# Patient Record
Sex: Female | Born: 1960 | Race: White | Hispanic: No | Smoking: Former smoker
Health system: Southern US, Community
[De-identification: ages and names within clinical notes are randomized; demographics above are authoritative.]

## PROBLEM LIST (undated history)

## (undated) DIAGNOSIS — I10 Essential (primary) hypertension: Secondary | ICD-10-CM

## (undated) DIAGNOSIS — C801 Malignant (primary) neoplasm, unspecified: Secondary | ICD-10-CM

## (undated) DIAGNOSIS — E119 Type 2 diabetes mellitus without complications: Secondary | ICD-10-CM

## (undated) DIAGNOSIS — M419 Scoliosis, unspecified: Secondary | ICD-10-CM

## (undated) DIAGNOSIS — G4730 Sleep apnea, unspecified: Secondary | ICD-10-CM

## (undated) DIAGNOSIS — F419 Anxiety disorder, unspecified: Secondary | ICD-10-CM

## (undated) DIAGNOSIS — F329 Major depressive disorder, single episode, unspecified: Secondary | ICD-10-CM

## (undated) DIAGNOSIS — E785 Hyperlipidemia, unspecified: Secondary | ICD-10-CM

## (undated) DIAGNOSIS — F32A Depression, unspecified: Secondary | ICD-10-CM

## (undated) HISTORY — DX: Malignant (primary) neoplasm, unspecified: C80.1

## (undated) HISTORY — PX: TONSILLECTOMY: SUR1361

## (undated) HISTORY — DX: Type 2 diabetes mellitus without complications: E11.9

## (undated) HISTORY — PX: APPENDECTOMY: SHX54

## (undated) HISTORY — PX: REPLACEMENT TOTAL KNEE: SUR1224

## (undated) HISTORY — DX: Sleep apnea, unspecified: G47.30

## (undated) HISTORY — DX: Scoliosis, unspecified: M41.9

## (undated) HISTORY — DX: Depression, unspecified: F32.A

## (undated) HISTORY — DX: Essential (primary) hypertension: I10

## (undated) HISTORY — DX: Hyperlipidemia, unspecified: E78.5

## (undated) HISTORY — DX: Anxiety disorder, unspecified: F41.9

## (undated) HISTORY — DX: Major depressive disorder, single episode, unspecified: F32.9

## (undated) HISTORY — PX: ABDOMINAL HYSTERECTOMY: SHX81

---

## 2000-10-02 ENCOUNTER — Other Ambulatory Visit: Admission: RE | Admit: 2000-10-02 | Discharge: 2000-10-02 | Payer: Self-pay | Admitting: Otolaryngology

## 2004-06-09 ENCOUNTER — Ambulatory Visit: Payer: Self-pay | Admitting: Internal Medicine

## 2005-02-07 ENCOUNTER — Ambulatory Visit: Payer: Self-pay | Admitting: Internal Medicine

## 2005-02-26 ENCOUNTER — Emergency Department: Payer: Self-pay | Admitting: Unknown Physician Specialty

## 2005-03-12 ENCOUNTER — Emergency Department: Payer: Self-pay | Admitting: Internal Medicine

## 2006-07-25 ENCOUNTER — Ambulatory Visit: Payer: Self-pay | Admitting: Unknown Physician Specialty

## 2006-08-23 ENCOUNTER — Ambulatory Visit: Payer: Self-pay | Admitting: Urology

## 2006-11-06 ENCOUNTER — Emergency Department: Payer: Self-pay | Admitting: Unknown Physician Specialty

## 2006-11-06 ENCOUNTER — Other Ambulatory Visit: Payer: Self-pay

## 2008-05-20 ENCOUNTER — Ambulatory Visit: Payer: Self-pay | Admitting: Unknown Physician Specialty

## 2008-05-21 ENCOUNTER — Inpatient Hospital Stay: Payer: Self-pay | Admitting: Unknown Physician Specialty

## 2009-05-09 ENCOUNTER — Ambulatory Visit: Payer: Self-pay

## 2009-11-18 ENCOUNTER — Ambulatory Visit: Payer: Self-pay | Admitting: Otolaryngology

## 2009-11-22 ENCOUNTER — Ambulatory Visit: Payer: Self-pay | Admitting: Otolaryngology

## 2009-12-16 ENCOUNTER — Ambulatory Visit: Payer: Self-pay | Admitting: Otolaryngology

## 2010-01-13 ENCOUNTER — Ambulatory Visit: Payer: Self-pay | Admitting: Otolaryngology

## 2010-12-27 ENCOUNTER — Ambulatory Visit: Payer: Self-pay | Admitting: Internal Medicine

## 2011-11-30 ENCOUNTER — Telehealth: Payer: Self-pay | Admitting: *Deleted

## 2014-04-16 ENCOUNTER — Ambulatory Visit
Admit: 2014-04-16 | Disposition: A | Discharge status: Home / Self Care | Payer: Self-pay | Attending: Obstetrics and Gynecology | Admitting: Obstetrics and Gynecology

## 2014-05-07 ENCOUNTER — Ambulatory Visit
Admit: 2014-05-07 | Disposition: A | Discharge status: Home / Self Care | Payer: Self-pay | Attending: Obstetrics and Gynecology | Admitting: Obstetrics and Gynecology

## 2014-07-21 ENCOUNTER — Telehealth: Payer: Self-pay | Admitting: Urology

## 2014-07-21 NOTE — Telephone Encounter (Signed)
We received a fax correspondence from Severiano Gilbert with the [facility].  She states that "the patient was scheduled for 05/07/14, but called and cancelled the appointment."

## 2014-07-23 ENCOUNTER — Ambulatory Visit: Payer: Self-pay | Admitting: Urology

## 2015-05-03 DIAGNOSIS — Z87891 Personal history of nicotine dependence: Secondary | ICD-10-CM | POA: Insufficient documentation

## 2015-05-03 DIAGNOSIS — Z8639 Personal history of other endocrine, nutritional and metabolic disease: Secondary | ICD-10-CM | POA: Insufficient documentation

## 2015-05-03 DIAGNOSIS — I10 Essential (primary) hypertension: Secondary | ICD-10-CM | POA: Insufficient documentation

## 2015-05-03 DIAGNOSIS — F329 Major depressive disorder, single episode, unspecified: Secondary | ICD-10-CM | POA: Insufficient documentation

## 2015-05-03 DIAGNOSIS — J449 Chronic obstructive pulmonary disease, unspecified: Secondary | ICD-10-CM | POA: Insufficient documentation

## 2015-05-03 DIAGNOSIS — F32A Depression, unspecified: Secondary | ICD-10-CM | POA: Insufficient documentation

## 2015-05-17 DIAGNOSIS — G4733 Obstructive sleep apnea (adult) (pediatric): Secondary | ICD-10-CM | POA: Insufficient documentation

## 2015-05-17 DIAGNOSIS — E559 Vitamin D deficiency, unspecified: Secondary | ICD-10-CM | POA: Insufficient documentation

## 2015-07-07 ENCOUNTER — Encounter: Payer: BLUE CROSS/BLUE SHIELD | Attending: Internal Medicine | Admitting: *Deleted

## 2015-07-07 ENCOUNTER — Encounter: Payer: Self-pay | Admitting: *Deleted

## 2015-07-07 VITALS — BP 124/82 | Ht 69.0 in | Wt 376.4 lb

## 2015-07-07 DIAGNOSIS — Z794 Long term (current) use of insulin: Secondary | ICD-10-CM

## 2015-07-07 DIAGNOSIS — E119 Type 2 diabetes mellitus without complications: Secondary | ICD-10-CM | POA: Insufficient documentation

## 2015-07-07 NOTE — Patient Instructions (Addendum)
Check blood sugars 2 x day before breakfast and before supper every day Exercise: Begin walking for  5 minutes 3-4 days a week and gradually increase to 30 minutes 5 x week Eat 3 meals day,  1-2  snacks a day Space meals 4-6 hours apart Limit desserts/snacks Make an eye doctor appointment Bring blood sugar records to the next class Carry fast acting glucose and a snack at all times Roll insulin pen to mix Rotate injection sites Hold pen in place for 5-10 seconds after injection

## 2015-07-08 NOTE — Progress Notes (Signed)
Diabetes Self-Management Education  Visit Type: First/Initial  Appt. Start Time: 1325 Appt. End Time: 1440  07/07/2015  Ms. Lorraine Hutchinson, identified by name and date of birth, is a 55 y.o. female with a diagnosis of Diabetes: Type 2.   ASSESSMENT  Blood pressure 124/82, height 5\' 9"  (1.753 m), weight 376 lb 6.4 oz (170.734 kg). Body mass index is 55.56 kg/(m^2).      Diabetes Self-Management Education - 07/07/15 1510    Visit Information   Visit Type First/Initial   Initial Visit   Diabetes Type Type 2   Are you currently following a meal plan? No   Are you taking your medications as prescribed? Yes   Date Diagnosed 11 years ago   Health Coping   How would you rate your overall health? Poor   Psychosocial Assessment   Patient Belief/Attitude about Diabetes Defeat/Burnout   Self-care barriers None   Self-management support Doctor's office   Patient Concerns Nutrition/Meal planning;Weight Control;Healthy Lifestyle;Glycemic Control   Special Needs None   Preferred Learning Style Hands on   Learning Readiness Contemplating   How often do you need to have someone help you when you read instructions, pamphlets, or other written materials from your doctor or pharmacy? 1 - Never   What is the last grade level you completed in school? 11   Pre-Education Assessment   Patient understands the diabetes disease and treatment process. Needs Instruction   Patient understands incorporating nutritional management into lifestyle. Needs Instruction   Patient undertands incorporating physical activity into lifestyle. Needs Instruction   Patient understands using medications safely. Needs Instruction   Patient understands monitoring blood glucose, interpreting and using results Needs Instruction   Patient understands prevention, detection, and treatment of acute complications. Needs Instruction   Patient understands prevention, detection, and treatment of chronic complications. Needs  Instruction   Patient understands how to develop strategies to address psychosocial issues. Needs Instruction   Patient understands how to develop strategies to promote health/change behavior. Needs Instruction   Complications   Last HgB A1C per patient/outside source 9.8 %  05/17/15   How often do you check your blood sugar? 1-2 times/day   Fasting Blood glucose range (mg/dL) 70-129;130-179  pt reports FBG's 120-160's mg/dL   Postprandial Blood glucose range (mg/dL) --  pt reports pre-supper readings 120-160's mg/dL   Have you had a dilated eye exam in the past 12 months? No   Have you had a dental exam in the past 12 months? No   Are you checking your feet? Yes   How many days per week are you checking your feet? 2   Dietary Intake   Breakfast raisin bran and milk; just bought oatmeal    Snack (morning) 2 snacks per day that consist of honeybuns and cakes   Lunch peanut butter and banana sandwich   Dinner meat and 2 vegetables;green beans, peas, cuccumbers, tomatoes, occasional potatoes   Beverage(s) water   Exercise   Exercise Type ADL's   Patient Education   Previous Diabetes Education No   Disease state  Definition of diabetes, type 1 and 2, and the diagnosis of diabetes   Nutrition management  Role of diet in the treatment of diabetes and the relationship between the three main macronutrients and blood glucose level   Physical activity and exercise  Role of exercise on diabetes management, blood pressure control and cardiac health.   Medications Taught/reviewed insulin injection, site rotation, insulin storage and needle disposal.;Reviewed patients medication for diabetes, action,  purpose, timing of dose and side effects.   Monitoring Purpose and frequency of SMBG.;Identified appropriate SMBG and/or A1C goals.   Acute complications Taught treatment of hypoglycemia - the 15 rule.   Chronic complications Relationship between chronic complications and blood glucose control    Psychosocial adjustment Identified and addressed patients feelings and concerns about diabetes   Individualized Goals (developed by patient)   Reducing Risk Improve blood sugars Lead a healthier lifestyle Lose weight   Outcomes   Expected Outcomes Demonstrated limited interest in learning.  Expect minimal changes      Individualized Plan for Diabetes Self-Management Training:   Learning Objective:  Patient will have a greater understanding of diabetes self-management. Patient education plan is to attend individual and/or group sessions per assessed needs and concerns.   Plan:   Patient Instructions  Check blood sugars 2 x day before breakfast and before supper every day Exercise: Begin walking for  5 minutes 3-4 days a week and gradually increase to 30 minutes 5 x week Eat 3 meals day,  1-2  snacks a day Space meals 4-6 hours apart Limit desserts/snacks Make an eye doctor appointment Bring blood sugar records to the next class Carry fast acting glucose and a snack at all times Roll insulin pen to mix Rotate injection sites Hold pen in place for 5-10 seconds after injection     Expected Outcomes:  Demonstrated limited interest in learning.  Expect minimal changes  Education material provided:  General Meal Planning Guidelines Simple Meal Plan Glucose tablets Symptoms, causes and treatments of Hypoglycemia Site Selection Using Insulin Pens and Pen Needles  If problems or questions, patient to contact team via:  Johny Drilling, [place], [place], [place] [phone]  Future DSME appointment:  July 11, 2015 for Diabetes Class 1

## 2015-07-11 ENCOUNTER — Encounter: Payer: BLUE CROSS/BLUE SHIELD | Admitting: Dietician

## 2015-07-11 ENCOUNTER — Encounter: Payer: Self-pay | Admitting: Dietician

## 2015-07-11 VITALS — Ht 69.0 in | Wt 377.3 lb

## 2015-07-11 DIAGNOSIS — E119 Type 2 diabetes mellitus without complications: Secondary | ICD-10-CM

## 2015-07-11 DIAGNOSIS — Z794 Long term (current) use of insulin: Principal | ICD-10-CM

## 2015-07-11 NOTE — Progress Notes (Signed)

## 2015-07-25 ENCOUNTER — Telehealth: Payer: Self-pay | Admitting: *Deleted

## 2015-07-25 ENCOUNTER — Ambulatory Visit: Payer: BLUE CROSS/BLUE SHIELD

## 2015-07-25 NOTE — Telephone Encounter (Signed)
Patient left voice mail that she couldn't make class tonight. She injured her back and can't sit for long periods.

## 2015-08-01 ENCOUNTER — Ambulatory Visit: Payer: BLUE CROSS/BLUE SHIELD

## 2015-08-03 ENCOUNTER — Telehealth: Payer: Self-pay | Admitting: Dietician

## 2015-08-03 NOTE — Telephone Encounter (Signed)
Called patient to reschedule missed classes; left voicemail message for her to call back.

## 2015-08-25 ENCOUNTER — Encounter: Payer: Self-pay | Admitting: *Deleted

## 2016-06-07 ENCOUNTER — Other Ambulatory Visit: Payer: Self-pay | Admitting: Student

## 2016-06-07 DIAGNOSIS — Z1231 Encounter for screening mammogram for malignant neoplasm of breast: Secondary | ICD-10-CM

## 2016-08-24 ENCOUNTER — Other Ambulatory Visit: Payer: Self-pay | Admitting: Orthopedic Surgery

## 2016-08-24 DIAGNOSIS — Z96651 Presence of right artificial knee joint: Principal | ICD-10-CM

## 2016-08-24 DIAGNOSIS — T8484XA Pain due to internal orthopedic prosthetic devices, implants and grafts, initial encounter: Secondary | ICD-10-CM

## 2016-08-31 ENCOUNTER — Ambulatory Visit
Admission: RE | Admit: 2016-08-31 | Discharge: 2016-08-31 | Disposition: A | Discharge status: Home / Self Care | Payer: BLUE CROSS/BLUE SHIELD | Source: Ambulatory Visit | Attending: Orthopedic Surgery | Admitting: Orthopedic Surgery

## 2016-08-31 DIAGNOSIS — Z9889 Other specified postprocedural states: Secondary | ICD-10-CM | POA: Diagnosis not present

## 2016-08-31 DIAGNOSIS — T8484XA Pain due to internal orthopedic prosthetic devices, implants and grafts, initial encounter: Secondary | ICD-10-CM

## 2016-08-31 DIAGNOSIS — Z96651 Presence of right artificial knee joint: Secondary | ICD-10-CM | POA: Insufficient documentation

## 2016-09-18 ENCOUNTER — Encounter: Payer: Self-pay | Admitting: Nurse Practitioner

## 2016-09-18 ENCOUNTER — Ambulatory Visit: Payer: BLUE CROSS/BLUE SHIELD | Attending: Nurse Practitioner | Admitting: Nurse Practitioner

## 2016-09-18 DIAGNOSIS — M79604 Pain in right leg: Secondary | ICD-10-CM

## 2016-09-18 DIAGNOSIS — J449 Chronic obstructive pulmonary disease, unspecified: Secondary | ICD-10-CM | POA: Diagnosis not present

## 2016-09-18 DIAGNOSIS — G4733 Obstructive sleep apnea (adult) (pediatric): Secondary | ICD-10-CM | POA: Insufficient documentation

## 2016-09-18 DIAGNOSIS — I10 Essential (primary) hypertension: Secondary | ICD-10-CM | POA: Insufficient documentation

## 2016-09-18 DIAGNOSIS — Z79891 Long term (current) use of opiate analgesic: Secondary | ICD-10-CM | POA: Diagnosis not present

## 2016-09-18 DIAGNOSIS — M25561 Pain in right knee: Secondary | ICD-10-CM | POA: Diagnosis not present

## 2016-09-18 DIAGNOSIS — Z885 Allergy status to narcotic agent status: Secondary | ICD-10-CM | POA: Insufficient documentation

## 2016-09-18 DIAGNOSIS — F329 Major depressive disorder, single episode, unspecified: Secondary | ICD-10-CM | POA: Diagnosis not present

## 2016-09-18 DIAGNOSIS — M542 Cervicalgia: Secondary | ICD-10-CM | POA: Diagnosis not present

## 2016-09-18 DIAGNOSIS — M79605 Pain in left leg: Secondary | ICD-10-CM

## 2016-09-18 DIAGNOSIS — M545 Low back pain: Secondary | ICD-10-CM | POA: Diagnosis not present

## 2016-09-18 DIAGNOSIS — Z6841 Body Mass Index (BMI) 40.0 and over, adult: Secondary | ICD-10-CM | POA: Insufficient documentation

## 2016-09-18 DIAGNOSIS — G8929 Other chronic pain: Secondary | ICD-10-CM

## 2016-09-18 DIAGNOSIS — Z79899 Other long term (current) drug therapy: Secondary | ICD-10-CM | POA: Insufficient documentation

## 2016-09-18 DIAGNOSIS — M25511 Pain in right shoulder: Secondary | ICD-10-CM

## 2016-09-18 DIAGNOSIS — Z87891 Personal history of nicotine dependence: Secondary | ICD-10-CM | POA: Diagnosis not present

## 2016-09-18 DIAGNOSIS — E785 Hyperlipidemia, unspecified: Secondary | ICD-10-CM | POA: Insufficient documentation

## 2016-09-18 DIAGNOSIS — E119 Type 2 diabetes mellitus without complications: Secondary | ICD-10-CM | POA: Diagnosis not present

## 2016-09-18 DIAGNOSIS — F1190 Opioid use, unspecified, uncomplicated: Secondary | ICD-10-CM | POA: Insufficient documentation

## 2016-09-18 DIAGNOSIS — M25512 Pain in left shoulder: Secondary | ICD-10-CM | POA: Diagnosis not present

## 2016-09-18 DIAGNOSIS — M533 Sacrococcygeal disorders, not elsewhere classified: Secondary | ICD-10-CM | POA: Diagnosis not present

## 2016-09-18 DIAGNOSIS — Z7984 Long term (current) use of oral hypoglycemic drugs: Secondary | ICD-10-CM | POA: Diagnosis not present

## 2016-09-18 DIAGNOSIS — E559 Vitamin D deficiency, unspecified: Secondary | ICD-10-CM | POA: Diagnosis not present

## 2016-09-18 DIAGNOSIS — Z1321 Encounter for screening for nutritional disorder: Secondary | ICD-10-CM | POA: Diagnosis not present

## 2016-09-18 NOTE — Progress Notes (Signed)
Patient's Name: Lorraine Hutchinson  MRN: [id number]  Referring Provider: Hessie Knows, MD  DOB: 1960-02-07  PCP: Wayland Denis, PA-C  DOS: 09/18/2016  Note by: Dionisio David NP  Service setting: Ambulatory outpatient  Specialty: Interventional Pain Management  Location: [facility] (AMB) Pain Management Facility    Patient type: New Patient    Primary Reason(s) for Visit: Initial Patient Evaluation CC: Back Pain (entire) and Neck Pain (shoulders)  HPI  Ms. Lorraine Hutchinson is a 56 y.o. year old, female patient, who comes today for an initial evaluation. She has Chronic depression; Chronic midline low back pain without sciatica; COPD, moderate ([place]); Essential hypertension; Former cigarette smoker; History of type 2 diabetes mellitus; Obesity, morbid, BMI 50 or higher ([place]); OSA (obstructive sleep apnea); Vitamin D deficiency, unspecified; on her problem list.. Her primarily concern today is the Back Pain (entire) and Neck Pain (shoulders)  Pain Assessment: Location: Upper, Mid, Lower Back Radiating: hips/buttocks down back of leg to the calve-(right) Onset: More than a month ago Duration: Chronic pain Quality: Shooting, Burning, Aching, Discomfort, Nagging Severity: 8 /10 (self-reported pain score)  Note: Reported level is compatible with observation.                   Effect on ADL: "light duty" sits a lot Timing: Constant Modifying factors: rest   Onset and Duration: Present longer than 3 months Cause of pain: Unknown Severity: Getting worse, NAS-11 at its worse: 10/10, NAS-11 at its best: 8/10, NAS-11 now: 8/10 and NAS-11 on the average: 8/10 Timing: After activity or exercise Aggravating Factors: Bending, Kneeling, Lifiting, Prolonged sitting, Prolonged standing, Stooping , Twisting, Walking, Walking uphill, Walking downhill and Working Alleviating Factors: Lying down, Resting and Sitting Associated Problems: Depression, Inability to control bladder (urine), Sadness, Sweating, Swelling, Pain that  wakes patient up and Pain that does not allow patient to sleep Quality of Pain: Aching, Burning, Dull, Nagging, Sharp, Shooting, Stabbing, Tingling and Uncomfortable Previous Examinations or Tests: MRI scan, X-rays and Orthopedic evaluation Previous Treatments: Pool exercises  The patient comes into the clinics today for the first time for a chronic pain management evaluation. According to the patient her primary area of pain is in her lower back she denies any precipitating factors. She feels this is related to her diagnosis of scoliosis. She did suffer a fall and 2017 which was worked related. She states that she tripped failed She admits that this has been resolved.she has been following Dr. Rudene Christians. She feels like the back pain is midline; the right could be greater than the left. She denies any previous surgeries.She has had epidural steroid by Dr. Sharlet Salina. She does not feel that this was effective. She did have physical therapy in 2017 which she states was not effective. She did go to 3 times per week.  Her second area of pain is in her lower extremities. She feels that the right is greater than the left. She does have radicular symptoms that goes down into her calf. She has tingling and weakness. She denies any recent physical therapy.  Her third area of pain is in her neck. She states that she does have some radicular symptoms that goes into her shoulders.She denies any previous surgery, interventional therapy, physical therapy or recent images for her neck.  Her fourth area of pain is in her shoulders. The right is greater than the left. She does have occasional numbness and tingling down into her fingertips. She denies any previous surgery, interventional therapy, physical therapy. She did  have recent images of her right shoulder.  Her last area of pain is in her right knee. She is status post total knee replacement approximately 10 year ago.she did have interventional procedures prior to your  surgery which were effective. She denies any recent physical therapy but she did have some recent images.  Today I took the time to provide the patient with information regarding this pain practice. The patient was informed that the practice is divided into two sections: an interventional pain management section, as well as a completely separate and distinct medication management section. I explained that there are procedure days for interventional therapies, and evaluation days for follow-ups and medication management. Because of the amount of documentation required during both, they are kept separated. This means that there is the possibility that she may be scheduled for a procedure on one day, and medication management the next. I have also informed herthat because of staffing and facility limitations, this practice will no longer take patients for medication management only. To illustrate the reasons for this, I gave the patient the example of surgeons, and how inappropriate it would be to refer a patient to her care, just to write for the post-surgical antibiotics on a surgery done by a different surgeon.   Because interventional pain management is part of the board-certified specialty for the doctors, the patient was informed that joining this practice means that they are open to any and all interventional therapies. I made it clear that this does not mean that they will be forced to have any procedures done. What this means is that I believe interventional therapies to be essential part of the diagnosis and proper management of chronic pain conditions. Therefore, patients not interested in these interventional alternatives will be better served under the care of a different practitioner.  The patient was also made aware of my Comprehensive Pain Management Safety Guidelines where by joining this practice, they limit all of their nerve blocks and joint injections to those done by our practice, for as  long as we are retained to manage their care. Historic Controlled Substance Pharmacotherapy Review  PMP and historical list of controlled substances: hydrocodone/acetaminophe.5/325, hydrocodone/acetaminophen 10/325 mg, Lyrica 75 mg, hydrocodone/acetaminophen 5/325 mg, hydrocodone/acetaminophen 7.5/500 mg, Highest opioid analgesic regimen found:hydrocodone/acetaminophen 10/325 mg 3 times daily (fill date 10/12/2014) (hydrocodone 30 mg per day) Most recent opioid analgesic: none Current opioid analgesics: none Highest recorded MME/day: 30 mg/day MME/day: 0 mg/day Medications: The patient did not bring the medication(s) to the appointment, as requested in our "New Patient Package" Pharmacodynamics: Desired effects: Analgesia: The patient reports >50% benefit. Reported improvement in function: The patient reports medication allows her to accomplish basic ADLs. Clinically meaningful improvement in function (CMIF): Sustained CMIF goals met Perceived effectiveness: Described as relatively effective, allowing for increase in activities of daily living (ADL) Undesirable effects: Side-effects or Adverse reactions: None reported Historical Monitoring: The patient  has no drug history on file. List of all UDS Test(s): No results found for: MDMA, COCAINSCRNUR, PCPSCRNUR, PCPQUANT, CANNABQUANT, THCU, [place] List of all Serum Drug Screening Test(s):  No results found for: AMPHSCRSER, BARBSCRSER, BENZOSCRSER, COCAINSCRSER, PCPSCRSER, PCPQUANT, THCSCRSER, CANNABQUANT, OPIATESCRSER, OXYSCRSER, PROPOXSCRSER Historical Background Evaluation: [place] PDMP: Six (6) year initial data search conducted.             [place] Department of public safety, offender search: Editor, commissioning Information) Non-contributory Risk Assessment Profile: Aberrant behavior: None observed or detected today Risk factors for fatal opioid overdose: None identified today Fatal overdose hazard ratio (  HR): Calculation deferred Non-fatal overdose hazard  ratio (HR): Calculation deferred Risk of opioid abuse or dependence: 0.7-3.0% with doses ? 36 MME/day and 6.1-26% with doses ? 120 MME/day. Substance use disorder (SUD) risk level: Pending results of Medical Psychology Evaluation for SUD Opioid risk tool (ORT) (Total Score): 3  ORT Scoring interpretation table:  Score <3 = Low Risk for SUD  Score between 4-7 = Moderate Risk for SUD  Score >8 = High Risk for Opioid Abuse   PHQ-2 Depression Scale:  Total score:    PHQ-2 Scoring interpretation table: (Score and probability of major depressive disorder)  Score 0 = No depression  Score 1 = 15.4% Probability  Score 2 = 21.1% Probability  Score 3 = 38.4% Probability  Score 4 = 45.5% Probability  Score 5 = 56.4% Probability  Score 6 = 78.6% Probability   PHQ-9 Depression Scale:  Total score:    PHQ-9 Scoring interpretation table:  Score 0-4 = No depression  Score 5-9 = Mild depression  Score 10-14 = Moderate depression  Score 15-19 = Moderately severe depression  Score 20-27 = Severe depression (2.4 times higher risk of SUD and 2.89 times higher risk of overuse)   Pharmacologic Plan: Pending ordered tests and/or consults  Meds  The patient has a current medication list which includes the following prescription(s): alpha-lipoic acid, th vitamin b 50/b-complex, calcium-vitamin d, vitamin d3, chromium picolinate, cinnamon, dextromethorphan-guaifenesin, diclofenac, diltiazem, duloxetine, insulin aspart protamine - aspart, lisinopril-hydrochlorothiazide, lutein, melatonin, multivitamin, multiple vitamins-minerals, omeprazole, potassium, pravastatin, trazodone, and metformin.  Current Outpatient Prescriptions on File Prior to Visit  Medication Sig  . B Complex-Biotin-FA (TH VITAMIN B 50/B-COMPLEX) TABS Take 1 tablet by mouth daily.  . Calcium-Vitamin D 600-200 MG-UNIT tablet Take 1 tablet by mouth 2 (two) times daily.  Marland Kitchen Dextromethorphan-Guaifenesin 60-1200 MG 12hr tablet Take 1 tablet by  mouth 2 (two) times daily.  . diclofenac (VOLTAREN) 75 MG EC tablet Take 75 mg by mouth 2 (two) times daily.  Marland Kitchen diltiazem (DILACOR XR) 180 MG 24 hr capsule Take 180 mg by mouth 2 (two) times daily.  . insulin aspart protamine - aspart (NOVOLOG MIX 70/30 FLEXPEN) (70-30) 100 UNIT/ML FlexPen Inject 60 Units into the skin daily before breakfast. 50 units before supper  . lisinopril-hydrochlorothiazide (PRINZIDE,ZESTORETIC) 20-25 MG tablet Take 1 tablet by mouth daily.  . Multiple Vitamin (MULTIVITAMIN) tablet Take 1 tablet by mouth daily.  Marland Kitchen omeprazole (PRILOSEC) 20 MG capsule Take 20 mg by mouth daily.  . Potassium 99 MG TABS Take 1 tablet by mouth daily.  . pravastatin (PRAVACHOL) 40 MG tablet Take 40 mg by mouth daily.  . traZODone (DESYREL) 50 MG tablet Take 50 mg by mouth daily.  . metFORMIN (GLUCOPHAGE) 1000 MG tablet Take 1,000 mg by mouth 2 (two) times daily.   No current facility-administered medications on file prior to visit.    Imaging Review  Lumbosacral Imaging:  Lumbar MR wo contrast:  Results for orders placed in visit on 07/25/06  MR L Spine Ltd W/O Cm   Narrative * PRIOR REPORT IMPORTED FROM AN EXTERNAL SYSTEM *   PRIOR REPORT IMPORTED FROM THE [facility] [place] EXAM:    low back pain  COMMENTS:   PROCEDURE:     MR  - MR LUMBAR SPINE WO CONTRAST  - Jul 25 2006  9:39AM   RESULT:       Multiplanar/multisequence imaging of the lumbar spine was  obtained.  Multiple benign-appearing hemangiomas are present.  No evidence  of disc protrusion or spinal stenosis. Neural foramen are patent.  The  lumbar cord is normal.   IMPRESSION:   Multilevel disc degeneration. No evidence of disc protrusion or spinal  stenosis.   Thank you for the opportunity to contribute to the care of your patient.      Knee Imaging:  Knee-L MR w contrast:  Results for orders placed in visit on 05/09/09  MR Knee Left  Wo Contrast   Narrative * PRIOR REPORT IMPORTED FROM AN  EXTERNAL SYSTEM *   PRIOR REPORT IMPORTED FROM THE [facility] WORKFLOW SYSTEM   REASON FOR EXAM:    Left Knee Pain Eval Meniscus Tear  COMMENTS:   PROCEDURE:     MR  - MR KNEE LT  WO CONTRAST  - May 09 2009  8:57AM   RESULT:   HISTORY: Knee pain.   PROCEDURE AND FINDINGS:  Multiplanar/multisequence imaging of the left  knee  is obtained.   No prior studies are available for comparison.   There is a horizontal tear of the posterior horn of the medial meniscus.  The  cruciate ligaments are intact. The collateral ligaments are intact. No  acute  bony abnormality identified. Prominent osteophyte formation is present.  Chondromalacia patellae noted. Subchondral edema is noted about the  patella.  Mild edema is noted about the anterior tibial plateau. Mild changes of  prepatellar bursitis are noted. The quadriceps and patellar tendons are  intact.   IMPRESSION:      Subtle horizontal tear of the posterior horn of the  medial  meniscus. Mild tibial osteophyte formation is noted. Patellofemoral  degenerative changes are present. There are prominent changes of  chondromalacia patellae. Prominent subchondral edema noted about the  patella  and this is most likely degenerative.   Thank you for this opportunity to contribute to the care of your patient.       Knee-R CT wo contrast:  Results for orders placed during the hospital encounter of 08/31/16  CT KNEE RIGHT WO CONTRAST   Narrative CLINICAL DATA:  Right knee pain after total knee replacement 2010.  EXAM: CT OF THE RIGHT KNEE WITHOUT CONTRAST  TECHNIQUE: Multidetector CT imaging of the right knee was performed according to the standard protocol. Multiplanar CT image reconstructions were also generated.  COMPARISON:  Right knee x-rays dated May 21, 2008.  FINDINGS: Bones/Joint/Cartilage  Postsurgical changes related to prior right total knee arthroplasty. There is excessive internal rotation of the tibial  component, measuring 33 degrees. There is no excessive internal rotation of the femoral component. There is no evidence of periprostatic fracture or lucency.  No significant knee joint effusion.  Ligaments  Suboptimally assessed by CT.  Muscles and Tendons  No focal abnormality.  Soft tissues  Scattered minimal subcutaneous edema.  Otherwise unremarkable.  IMPRESSION: 1. Postsurgical changes related to prior right total knee arthroplasty without acute osseous abnormality. 2. Excessive internal rotation of the tibial component, measuring 33 degrees.   Electronically Signed   By: Titus Dubin M.D.   On: 08/31/2016 17:06    Knee-R DG 1-2 views:  Results for orders placed in visit on 05/21/08  DG Knee 1-2 Views Right   Narrative * PRIOR REPORT IMPORTED FROM AN EXTERNAL SYSTEM *   PRIOR REPORT IMPORTED FROM THE [facility] WORKFLOW SYSTEM   REASON FOR EXAM:    postop  COMMENTS:   Bedside (portable):Y   PROCEDURE:     DXR - DXR KNEE RIGHT AP AND LATERAL  -  May 21 2008 11:31AM   RESULT:     The patient is status post total right knee replacement. The  femoral and acetabular components appear intact. Skin staples and surgical  drains are identified about the right knee. The visualized osseous  structures appear unremarkable. There is no evidence of hardware loosening  or failure.   IMPRESSION:     Status post total right knee replacement.   The remainder of the interpretation will be left to the performing  physician.   Thank you for this opportunity to contribute to the care of your patient.       Knee-R DG 4 views:  Results for orders placed in visit on 03/12/05  DG Knee Complete 4 Views Right   Narrative * PRIOR REPORT IMPORTED FROM AN EXTERNAL SYSTEM *   PRIOR REPORT IMPORTED FROM THE [facility] WORKFLOW SYSTEM   REASON FOR EXAM:  fall  COMMENTS:   PROCEDURE:     DXR - DXR KNEE RT COMP WITH OBLIQUES  - Mar 12 2005  5:47PM   RESULT:     Four views of the RIGHT  knee show no fracture or dislocation.  There is degenerative spurring at the knee medially and laterally, but  more  prominent laterally.  The patella is intact.   IMPRESSION:   1)No acute bony abnormalities are noted about the knee.   2)Mild arthritic spurring is seen particularly on the RIGHT.   Thank you for this opportunity to contribute to the care of your patient.       Note: Available results from prior imaging studies were reviewed.        ROS  Cardiovascular History: High blood pressure Pulmonary or Respiratory History: Shortness of breath, Snoring  and Temporary stoppage of breathing during sleep Neurological History: No reported neurological signs or symptoms such as seizures, abnormal skin sensations, urinary and/or fecal incontinence, being born with an abnormal open spine and/or a tethered spinal cord Review of Past Neurological Studies: No results found for this or any previous visit. Psychological-Psychiatric History: Depressed and Difficulty sleeping and or falling asleep Gastrointestinal History: Reflux or heatburn and Irregular, infrequent bowel movements (Constipation) Genitourinary History: Passing kidney stones and Recurrent Urinary Tract infections Hematological History: No reported hematological signs or symptoms such as prolonged bleeding, low or poor functioning platelets, bruising or bleeding easily, hereditary bleeding problems, low energy levels due to low hemoglobin or being anemic Endocrine History: High blood sugar controlled without the use of insulin (NIDDM) Rheumatologic History: No reported rheumatological signs and symptoms such as fatigue, joint pain, tenderness, swelling, redness, heat, stiffness, decreased range of motion, with or without associated rash Musculoskeletal History: Negative for myasthenia gravis, muscular dystrophy, multiple sclerosis or malignant hyperthermia Work History: Working full time  Allergies  Ms. Lata is allergic to  codeine.  Laboratory Chemistry  Inflammation Markers No results found for: CRP, ESRSEDRATE (CRP: Acute Phase) (ESR: Chronic Phase) Renal Function Markers No results found for: BUN, CREATININE, GFRAA, GFRNONAA Hepatic Function Markers No results found for: AST, ALT, ALBUMIN, ALKPHOS, HCVAB Electrolytes No results found for: NA, K, CL, CALCIUM, MG Neuropathy Markers No results found for: [id number] Bone Pathology Markers No results found for: Hendricks Milo, VD125OH2TOT, [id number], [id number], 25OHVITD1, 25OHVITD2, 25OHVITD3, CALCIUM, TESTOFREE, TESTOSTERONE Coagulation Parameters No results found for: INR, LABPROT, APTT, PLT Cardiovascular Markers No results found for: BNP, HGB, HCT Note: Lab results reviewed.  [place]  Drug: Ms. Zobrist  has no drug history on file. Alcohol:  reports that she does  not drink alcohol. Tobacco:  reports that she quit smoking about 20 months ago. Her smoking use included Cigarettes. She has a 40.00 pack-year smoking history. She has never used smokeless tobacco. Medical:  has a past medical history of Cancer ([place]); Diabetes mellitus without complication ([place]); Hyperlipidemia; Hypertension; Scoliosis; and Sleep apnea. Family: family history includes Cancer in her mother; Diabetes in her father and mother.  Past Surgical History:  Procedure Laterality Date  . ABDOMINAL HYSTERECTOMY    . APPENDECTOMY    . CESAREAN SECTION    . REPLACEMENT TOTAL KNEE Right   . TONSILLECTOMY     Active Ambulatory Problems    Diagnosis Date Noted  . Chronic depression 05/03/2015  . Chronic midline low back pain without sciatica 05/03/2015  . COPD, moderate ([place]) 05/03/2015  . Essential hypertension 05/03/2015  . Former cigarette smoker 05/03/2015  . History of type 2 diabetes mellitus 05/03/2015  . Obesity, morbid, BMI 50 or higher ([place]) 05/03/2015  . OSA (obstructive sleep apnea) 05/17/2015  . Vitamin D deficiency, unspecified 05/17/2015  . Long term current use  of opiate analgesic 09/18/2016  . Long term prescription opiate use 09/18/2016  . Opiate use 09/18/2016  . Chronic low back pain (Primary Area of Pain) (R>L) 09/18/2016  . Chronic pain of lower extremity (Secondary Area of Pain) (R>L) 09/18/2016  . Chronic sacroiliac joint pain 09/18/2016  . Chronic neck pain [facility] Area of Pain)( (R>L) 09/18/2016  . Chronic shoulder pain (Fourth Area of Pain) (R>L) 09/18/2016  . Encounter for screening for nutritional disorder 09/18/2016  . Chronic pain of right knee 09/18/2016   Resolved Ambulatory Problems    Diagnosis Date Noted  . No Resolved Ambulatory Problems   Past Medical History:  Diagnosis Date  . Cancer ([place])   . Diabetes mellitus without complication ([place])   . Hyperlipidemia   . Hypertension   . Scoliosis   . Sleep apnea    Constitutional Exam  General appearance: Well nourished, well developed, and well hydrated. In no apparent acute distress Vitals:   09/18/16 0755  BP: (!) 144/66  Pulse: 99  Resp: 16  Temp: 98 F (36.7 C)  SpO2: 97%  Weight: (!) 398 lb (180.5 kg)  Height: _0  (1.753 m)   BMI Assessment: Estimated body mass index is 58.77 kg/m as calculated from the following:   Height as of this encounter: _1  (1.753 m).   Weight as of this encounter: 398 lb (180.5 kg).   Psych/Mental status: Alert, oriented x 3 (person, place, & time)       Eyes: PERLA Respiratory: No evidence of acute respiratory distress  Cervical Spine Exam  Inspection: No masses, redness, or swelling Alignment: Symmetrical Functional ROM: Unrestricted ROM      Stability: No instability detected Muscle strength & Tone: Functionally intact Sensory: Unimpaired Palpation: No palpable anomalies              Upper Extremity (UE) Exam    Side: Right upper extremity  Side: Left upper extremity  Inspection: No masses, redness, swelling, or asymmetry. No contractures  Inspection: No masses, redness, swelling, or asymmetry. No contractures   Functional ROM: Unrestricted ROM          Functional ROM: Unrestricted ROM          Muscle strength & Tone: Functionally intact  Muscle strength & Tone: Functionally intact  Sensory: Unimpaired  Sensory: Unimpaired  Palpation: No palpable anomalies  Palpation: No palpable anomalies              Specialized Test(s): Deferred         Specialized Test(s): Deferred          Thoracic Spine Exam  Inspection: No masses, redness, or swelling Alignment: Symmetrical Functional ROM: Unrestricted ROM Stability: No instability detected Sensory: Unimpaired Muscle strength & Tone: No palpable anomalies  Lumbar Spine Exam  Inspection: No masses, redness, or swelling Alignment: Symmetrical Functional ROM: Unrestricted ROM      Stability: No instability detected Muscle strength & Tone: Functionally intact Sensory: Unimpaired Palpation: No palpable anomalies       Provocative Tests: Lumbar Hyperextension and rotation test: evaluation deferred today       Patrick's Maneuver: evaluation deferred today                    Gait & Posture Assessment  Ambulation: Unassisted Gait: Relatively normal for age and body habitus Posture: WNL   Lower Extremity Exam    Side: Right lower extremity  Side: Left lower extremity  Inspection: No masses, redness, swelling, or asymmetry. No contractures  Inspection: No masses, redness, swelling, or asymmetry. No contractures  Functional ROM: Unrestricted ROM          Functional ROM: Unrestricted ROM          Muscle strength & Tone: Functionally intact  Muscle strength & Tone: Functionally intact  Sensory: Unimpaired  Sensory: Unimpaired  Palpation: No palpable anomalies  Palpation: No palpable anomalies   Assessment  Primary Diagnosis & Pertinent Problem List: Diagnoses of Chronic bilateral low back pain, with sciatica presence unspecified, Chronic pain of both lower extremities, Chronic neck pain, Chronic pain of both shoulders, Chronic sacroiliac joint  pain, Chronic pain of right knee, Long term current use of opiate analgesic, and Encounter for screening for nutritional disorder were pertinent to this visit.  Visit Diagnosis: 1. Chronic bilateral low back pain, with sciatica presence unspecified   2. Chronic pain of both lower extremities   3. Chronic neck pain   4. Chronic pain of both shoulders   5. Chronic sacroiliac joint pain   6. Chronic pain of right knee   7. Long term current use of opiate analgesic   8. Encounter for screening for nutritional disorder    Plan of Care  Initial treatment plan:  Please be advised that as per protocol, today's visit has been an evaluation only. We have not taken over the patient's controlled substance management.  Problem-specific plan: No problem-specific Assessment & Plan notes found for this encounter.  Ordered Lab-work, Procedure(s), Referral(s), & Consult(s): Orders Placed This Encounter  Procedures  . DG Lumb Spine Flex&Ext Only  . DG Cervical Spine With Flex & Extend  . DG Si Joints  . Compliance Drug Analysis, Ur  . Comprehensive metabolic panel  . C-reactive protein  . Sedimentation rate  . Magnesium  . 25-Hydroxyvitamin D Lcms D2+D3  . Vitamin B12  . Ambulatory referral to Psychology   Pharmacotherapy: Medications ordered:  No orders of the defined types were placed in this encounter.  Medications administered during this visit: Ms. Mangels had no medications administered during this visit.   Pharmacotherapy under consideration:  Opioid Analgesics: The patient was informed that there is no guarantee that she would be a candidate for opioid analgesics. The decision will be made following CDC guidelines. This decision will be based on the results of diagnostic studies, as well as Ms. Esco's risk  profile.  Membrane stabilizer: To be determined at a later time Muscle relaxant: To be determined at a later time NSAID: To be determined at a later time Other  analgesic(s): To be determined at a later time   Interventional therapies under consideration: Ms. Wilton was informed that there is no guarantee that she would be a candidate for interventional therapies. The decision will be based on the results of diagnostic studies, as well as Ms. Welter's risk profile.  Possible procedure(s): possible diagnostic right lumbar epidural steroid injection Possible diagnostic right lumbar facet injection right lumbar radiofrequency ablation Possible diagnostic right shoulder steroid injection Possible right suprascapular nerve block Possible right scapular radiofrequency ablation Possible bilateral cervical epidural steroid injection Possible bilateral cervical facet block Bilateral cervical radiofrequency ablation Possible diagnostic Right genicular nerve block Right knee radiofrequency ablation   Provider-requested follow-up: Return for 2nd Visit, w/ Dr. Dossie Arbour, after [facility] eval.  No future appointments.  Primary Care Physician: Wayland Denis, PA-C Location: [facility] Facility Note by:  Date: 09/18/2016; Time: 1:37 PM  Pain Score Disclaimer: We use the NRS-11 scale. This is a self-reported, subjective measurement of pain severity with only modest accuracy. It is used primarily to identify changes within a particular patient. It must be understood that outpatient pain scales are significantly less accurate that those used for research, where they can be applied under ideal controlled circumstances with minimal exposure to variables. In reality, the score is likely to be a combination of pain intensity and pain affect, where pain affect describes the degree of emotional arousal or changes in action readiness caused by the sensory experience of pain. Factors such as social and work situation, setting, emotional state, anxiety levels, expectation, and prior pain experience may influence pain perception and show large  inter-individual differences that may also be affected by time variables.  Patient instructions provided during this appointment: Patient Instructions    ____________________________________________________________________________________________  Appointment Policy Summary  It is our goal and responsibility to provide the medical community with assistance in the evaluation and management of patients with chronic pain. Unfortunately our resources are limited. Because we do not have an unlimited amount of time, or available appointments, we are required to closely monitor and manage their use. The following rules exist to maximize their use:  Patient's responsibilities: 1. Punctuality:  At what time should I arrive? You should be physically present in our office 30 minutes before your scheduled appointment. Your scheduled appointment is with your assigned healthcare provider. However, it takes 5-10 minutes to be "checked-in", and another 15 minutes for the nurses to do the admission. If you arrive to our office at the time you were given for your appointment, you will end up being at least 20-25 minutes late to your appointment with the provider. 2. Tardiness:  What happens if I arrive only a few minutes after my scheduled appointment time? You will need to reschedule your appointment. The cutoff is your appointment time. This is why it is so important that you arrive at least 30 minutes before that appointment. If you have an appointment scheduled for 10:00 AM and you arrive at 10:01, you will be required to reschedule your appointment.  3. Plan ahead:  Always assume that you will encounter traffic on your way in. Plan for it. If you are dependent on a driver, make sure they understand these rules and the need to arrive early. 4. Other appointments and responsibilities:  Avoid scheduling any other appointments before or after your  pain clinic appointments.  5. Be prepared:  Write down  everything that you need to discuss with your healthcare provider and give this information to the admitting nurse. Write down the medications that you will need refilled. Bring your pills and bottles (even the empty ones), to all of your appointments, except for those where a procedure is scheduled. 6. No children or pets:  Find someone to take care of them. It is not appropriate to bring them in. 7. Scheduling changes:  We request "advanced notification" of any changes or cancellations. 8. Advanced notification:  Defined as a time period of more than 24 hours prior to the originally scheduled appointment. This allows for the appointment to be offered to other patients. 9. Rescheduling:  When a visit is rescheduled, it will require the cancellation of the original appointment. For this reason they both fall within the category of "Cancellations".  10. Cancellations:  They require advanced notification. Any cancellation less than 24 hours before the  appointment will be recorded as a "No Show". 11. No Show:  Defined as an unkept appointment where the patient failed to notify or declare to the practice their intention or inability to keep the appointment.  Corrective process for repeat offenders:  1. Tardiness: Three (3) episodes of rescheduling due to late arrivals will be recorded as one (1) "No Show". 2. Cancellation or reschedule: Three (3) cancellations or rescheduling will be recorded as one (1) "No Show". 3. "No Shows": Three (3) "No Shows" within a 12 month period will result in discharge from the practice.  ____________________________________________________________________________________________  ____________________________________________________________________________________________  Pain Scale  Introduction: The pain score used by this practice is the Verbal Numerical Rating Scale (VNRS-11). This is an 11-point scale. It is for adults and children 10 years or older. There  are significant differences in how the pain score is reported, used, and applied. Forget everything you learned in the past and learn this scoring system.  General Information: The scale should reflect your current level of pain. Unless you are specifically asked for the level of your worst pain, or your average pain. If you are asked for one of these two, then it should be understood that it is over the past 24 hours.  Basic Activities of Daily Living (ADL): Personal hygiene, dressing, eating, transferring, and using restroom.  Instructions: Most patients tend to report their level of pain as a combination of two factors, their physical pain and their psychosocial pain. This last one is also known as "suffering" and it is reflection of how physical pain affects you socially and psychologically. From now on, report them separately. From this point on, when asked to report your pain level, report only your physical pain. Use the following table for reference.  Pain Clinic Pain Levels (0-5/10)  Pain Level Score  Description  No Pain 0   Mild pain 1 Nagging, annoying, but does not interfere with basic activities of daily living (ADL). Patients are able to eat, bathe, get dressed, toileting (being able to get on and off the toilet and perform personal hygiene functions), transfer (move in and out of bed or a chair without assistance), and maintain continence (able to control bladder and bowel functions). Blood pressure and heart rate are unaffected. A normal heart rate for a healthy adult ranges from 60 to 100 bpm (beats per minute).   Mild to moderate pain 2 Noticeable and distracting. Impossible to hide from other people. More frequent flare-ups. Still possible to adapt and function close  to normal. It can be very annoying and may have occasional stronger flare-ups. With discipline, patients may get used to it and adapt.   Moderate pain 3 Interferes significantly with activities of daily living (ADL).  It becomes difficult to feed, bathe, get dressed, get on and off the toilet or to perform personal hygiene functions. Difficult to get in and out of bed or a chair without assistance. Very distracting. With effort, it can be ignored when deeply involved in activities.   Moderately severe pain 4 Impossible to ignore for more than a few minutes. With effort, patients may still be able to manage work or participate in some social activities. Very difficult to concentrate. Signs of autonomic nervous system discharge are evident: dilated pupils (mydriasis); mild sweating (diaphoresis); sleep interference. Heart rate becomes elevated (>115 bpm). Diastolic blood pressure (lower number) rises above 100 mmHg. Patients find relief in laying down and not moving.   Severe pain 5 Intense and extremely unpleasant. Associated with frowning face and frequent crying. Pain overwhelms the senses.  Ability to do any activity or maintain social relationships becomes significantly limited. Conversation becomes difficult. Pacing back and forth is common, as getting into a comfortable position is nearly impossible. Pain wakes you up from deep sleep. Physical signs will be obvious: pupillary dilation; increased sweating; goosebumps; brisk reflexes; cold, clammy hands and feet; nausea, vomiting or dry heaves; loss of appetite; significant sleep disturbance with inability to fall asleep or to remain asleep. When persistent, significant weight loss is observed due to the complete loss of appetite and sleep deprivation.  Blood pressure and heart rate becomes significantly elevated. Caution: If elevated blood pressure triggers a pounding headache associated with blurred vision, then the patient should immediately seek attention at an urgent or emergency care unit, as these may be signs of an impending stroke.    Emergency Department Pain Levels (6-10/10)  Emergency Room Pain 6 Severely limiting. Requires emergency care and should not  be seen or managed at an outpatient pain management facility. Communication becomes difficult and requires great effort. Assistance to reach the emergency department may be required. Facial flushing and profuse sweating along with potentially dangerous increases in heart rate and blood pressure will be evident.   Distressing pain 7 Self-care is very difficult. Assistance is required to transport, or use restroom. Assistance to reach the emergency department will be required. Tasks requiring coordination, such as bathing and getting dressed become very difficult.   Disabling pain 8 Self-care is no longer possible. At this level, pain is disabling. The individual is unable to do even the most "basic" activities such as walking, eating, bathing, dressing, transferring to a bed, or toileting. Fine motor skills are lost. It is difficult to think clearly.   Incapacitating pain 9 Pain becomes incapacitating. Thought processing is no longer possible. Difficult to remember your own name. Control of movement and coordination are lost.   The worst pain imaginable 10 At this level, most patients pass out from pain. When this level is reached, collapse of the autonomic nervous system occurs, leading to a sudden drop in blood pressure and heart rate. This in turn results in a temporary and dramatic drop in blood flow to the brain, leading to a loss of consciousness. Fainting is one of the body's self defense mechanisms. Passing out puts the brain in a calmed state and causes it to shut down for a while, in order to begin the healing process.    Summary: 1. Refer to this  scale when providing [place] with your pain level. 2. Be accurate and careful when reporting your pain level. This will help with your care. 3. Over-reporting your pain level will lead to loss of credibility. 4. Even a level of 1/10 means that there is pain and will be treated at our facility. 5. High, inaccurate reporting will be documented as "Symptom  Exaggeration", leading to loss of credibility and suspicions of possible secondary gains such as obtaining more narcotics, or wanting to appear disabled, for fraudulent reasons. 6. Only pain levels of 5 or below will be seen at our facility. 7. Pain levels of 6 and above will be sent to the Emergency Department and the appointment cancelled. ____________________________________________________________________________________________   BMI Assessment: Estimated body mass index is 58.77 kg/m as calculated from the following:   Height as of this encounter: _0  (1.753 m).   Weight as of this encounter: 398 lb (180.5 kg).  BMI interpretation table: BMI level Category Range association with higher incidence of chronic pain  <18 kg/m2 Underweight   18.5-24.9 kg/m2 Ideal body weight   25-29.9 kg/m2 Overweight Increased incidence by 20%  30-34.9 kg/m2 Obese (Class I) Increased incidence by 68%  35-39.9 kg/m2 Severe obesity (Class II) Increased incidence by 136%  >40 kg/m2 Extreme obesity (Class III) Increased incidence by 254%   BMI Readings from Last 4 Encounters:  09/18/16 58.77 kg/m  07/11/15 55.72 kg/m  07/07/15 55.58 kg/m   Wt Readings from Last 4 Encounters:  09/18/16 (!) 398 lb (180.5 kg)  07/11/15 (!) 377 lb 4.8 oz (171.1 kg)  07/07/15 (!) 376 lb 6.4 oz (170.7 kg)

## 2016-09-18 NOTE — Patient Instructions (Addendum)
____________________________________________________________________________________________  Appointment Policy Summary  It is our goal and responsibility to provide the medical community with assistance in the evaluation and management of patients with chronic pain. Unfortunately our resources are limited. Because we do not have an unlimited amount of time, or available appointments, we are required to closely monitor and manage their use. The following rules exist to maximize their use:  Patient's responsibilities: 1. Punctuality:  At what time should I arrive? You should be physically present in our office 30 minutes before your scheduled appointment. Your scheduled appointment is with your assigned healthcare provider. However, it takes 5-10 minutes to be "checked-in", and another 15 minutes for the nurses to do the admission. If you arrive to our office at the time you were given for your appointment, you will end up being at least 20-25 minutes late to your appointment with the provider. 2. Tardiness:  What happens if I arrive only a few minutes after my scheduled appointment time? You will need to reschedule your appointment. The cutoff is your appointment time. This is why it is so important that you arrive at least 30 minutes before that appointment. If you have an appointment scheduled for 10:00 AM and you arrive at 10:01, you will be required to reschedule your appointment.  3. Plan ahead:  Always assume that you will encounter traffic on your way in. Plan for it. If you are dependent on a driver, make sure they understand these rules and the need to arrive early. 4. Other appointments and responsibilities:  Avoid scheduling any other appointments before or after your pain clinic appointments.  5. Be prepared:  Write down everything that you need to discuss with your healthcare provider and give this information to the admitting nurse. Write down the medications that you will need  refilled. Bring your pills and bottles (even the empty ones), to all of your appointments, except for those where a procedure is scheduled. 6. No children or pets:  Find someone to take care of them. It is not appropriate to bring them in. 7. Scheduling changes:  We request "advanced notification" of any changes or cancellations. 8. Advanced notification:  Defined as a time period of more than 24 hours prior to the originally scheduled appointment. This allows for the appointment to be offered to other patients. 9. Rescheduling:  When a visit is rescheduled, it will require the cancellation of the original appointment. For this reason they both fall within the category of "Cancellations".  10. Cancellations:  They require advanced notification. Any cancellation less than 24 hours before the  appointment will be recorded as a "No Show". 11. No Show:  Defined as an unkept appointment where the patient failed to notify or declare to the practice their intention or inability to keep the appointment.  Corrective process for repeat offenders:  1. Tardiness: Three (3) episodes of rescheduling due to late arrivals will be recorded as one (1) "No Show". 2. Cancellation or reschedule: Three (3) cancellations or rescheduling will be recorded as one (1) "No Show". 3. "No Shows": Three (3) "No Shows" within a 12 month period will result in discharge from the practice.  ____________________________________________________________________________________________  ____________________________________________________________________________________________  Pain Scale  Introduction: The pain score used by this practice is the Verbal Numerical Rating Scale (VNRS-11). This is an 11-point scale. It is for adults and children 10 years or older. There are significant differences in how the pain score is reported, used, and applied. Forget everything you learned in the past and  learn this scoring  system.  General Information: The scale should reflect your current level of pain. Unless you are specifically asked for the level of your worst pain, or your average pain. If you are asked for one of these two, then it should be understood that it is over the past 24 hours.  Basic Activities of Daily Living (ADL): Personal hygiene, dressing, eating, transferring, and using restroom.  Instructions: Most patients tend to report their level of pain as a combination of two factors, their physical pain and their psychosocial pain. This last one is also known as "suffering" and it is reflection of how physical pain affects you socially and psychologically. From now on, report them separately. From this point on, when asked to report your pain level, report only your physical pain. Use the following table for reference.  Pain Clinic Pain Levels (0-5/10)  Pain Level Score  Description  No Pain 0   Mild pain 1 Nagging, annoying, but does not interfere with basic activities of daily living (ADL). Patients are able to eat, bathe, get dressed, toileting (being able to get on and off the toilet and perform personal hygiene functions), transfer (move in and out of bed or a chair without assistance), and maintain continence (able to control bladder and bowel functions). Blood pressure and heart rate are unaffected. A normal heart rate for a healthy adult ranges from 60 to 100 bpm (beats per minute).   Mild to moderate pain 2 Noticeable and distracting. Impossible to hide from other people. More frequent flare-ups. Still possible to adapt and function close to normal. It can be very annoying and may have occasional stronger flare-ups. With discipline, patients may get used to it and adapt.   Moderate pain 3 Interferes significantly with activities of daily living (ADL). It becomes difficult to feed, bathe, get dressed, get on and off the toilet or to perform personal hygiene functions. Difficult to get in and out of  bed or a chair without assistance. Very distracting. With effort, it can be ignored when deeply involved in activities.   Moderately severe pain 4 Impossible to ignore for more than a few minutes. With effort, patients may still be able to manage work or participate in some social activities. Very difficult to concentrate. Signs of autonomic nervous system discharge are evident: dilated pupils (mydriasis); mild sweating (diaphoresis); sleep interference. Heart rate becomes elevated (>115 bpm). Diastolic blood pressure (lower number) rises above 100 mmHg. Patients find relief in laying down and not moving.   Severe pain 5 Intense and extremely unpleasant. Associated with frowning face and frequent crying. Pain overwhelms the senses.  Ability to do any activity or maintain social relationships becomes significantly limited. Conversation becomes difficult. Pacing back and forth is common, as getting into a comfortable position is nearly impossible. Pain wakes you up from deep sleep. Physical signs will be obvious: pupillary dilation; increased sweating; goosebumps; brisk reflexes; cold, clammy hands and feet; nausea, vomiting or dry heaves; loss of appetite; significant sleep disturbance with inability to fall asleep or to remain asleep. When persistent, significant weight loss is observed due to the complete loss of appetite and sleep deprivation.  Blood pressure and heart rate becomes significantly elevated. Caution: If elevated blood pressure triggers a pounding headache associated with blurred vision, then the patient should immediately seek attention at an urgent or emergency care unit, as these may be signs of an impending stroke.    Emergency Department Pain Levels (6-10/10)  Emergency Room Pain 6   Severely limiting. Requires emergency care and should not be seen or managed at an outpatient pain management facility. Communication becomes difficult and requires great effort. Assistance to reach the  emergency department may be required. Facial flushing and profuse sweating along with potentially dangerous increases in heart rate and blood pressure will be evident.   Distressing pain 7 Self-care is very difficult. Assistance is required to transport, or use restroom. Assistance to reach the emergency department will be required. Tasks requiring coordination, such as bathing and getting dressed become very difficult.   Disabling pain 8 Self-care is no longer possible. At this level, pain is disabling. The individual is unable to do even the most "basic" activities such as walking, eating, bathing, dressing, transferring to a bed, or toileting. Fine motor skills are lost. It is difficult to think clearly.   Incapacitating pain 9 Pain becomes incapacitating. Thought processing is no longer possible. Difficult to remember your own name. Control of movement and coordination are lost.   The worst pain imaginable 10 At this level, most patients pass out from pain. When this level is reached, collapse of the autonomic nervous system occurs, leading to a sudden drop in blood pressure and heart rate. This in turn results in a temporary and dramatic drop in blood flow to the brain, leading to a loss of consciousness. Fainting is one of the body's self defense mechanisms. Passing out puts the brain in a calmed state and causes it to shut down for a while, in order to begin the healing process.    Summary: 1. Refer to this scale when providing [place] with your pain level. 2. Be accurate and careful when reporting your pain level. This will help with your care. 3. Over-reporting your pain level will lead to loss of credibility. 4. Even a level of 1/10 means that there is pain and will be treated at our facility. 5. High, inaccurate reporting will be documented as "Symptom Exaggeration", leading to loss of credibility and suspicions of possible secondary gains such as obtaining more narcotics, or wanting to appear  disabled, for fraudulent reasons. 6. Only pain levels of 5 or below will be seen at our facility. 7. Pain levels of 6 and above will be sent to the Emergency Department and the appointment cancelled. ____________________________________________________________________________________________   BMI Assessment: Estimated body mass index is 58.77 kg/m as calculated from the following:   Height as of this encounter: 5\' 9"  (1.753 m).   Weight as of this encounter: 398 lb (180.5 kg).  BMI interpretation table: BMI level Category Range association with higher incidence of chronic pain  <18 kg/m2 Underweight   18.5-24.9 kg/m2 Ideal body weight   25-29.9 kg/m2 Overweight Increased incidence by 20%  30-34.9 kg/m2 Obese (Class I) Increased incidence by 68%  35-39.9 kg/m2 Severe obesity (Class II) Increased incidence by 136%  >40 kg/m2 Extreme obesity (Class III) Increased incidence by 254%   BMI Readings from Last 4 Encounters:  09/18/16 58.77 kg/m  07/11/15 55.72 kg/m  07/07/15 55.58 kg/m   Wt Readings from Last 4 Encounters:  09/18/16 (!) 398 lb (180.5 kg)  07/11/15 (!) 377 lb 4.8 oz (171.1 kg)  07/07/15 (!) 376 lb 6.4 oz (170.7 kg)

## 2016-09-18 NOTE — Progress Notes (Signed)
Safety precautions to be maintained throughout the outpatient stay will include: orient to surroundings, keep bed in low position, maintain call bell within reach at all times, provide assistance with transfer out of bed and ambulation.  

## 2016-09-21 LAB — COMPLIANCE DRUG ANALYSIS, UR

## 2016-09-23 LAB — COMPREHENSIVE METABOLIC PANEL
A/G RATIO: 1.7 (ref 1.2–2.2)
ALK PHOS: 33 IU/L — AB (ref 39–117)
ALT: 18 IU/L (ref 0–32)
AST: 17 IU/L (ref 0–40)
Albumin: 4.3 g/dL (ref 3.5–5.5)
BUN / CREAT RATIO: 19 (ref 9–23)
BUN: 19 mg/dL (ref 6–24)
Bilirubin Total: 0.3 mg/dL (ref 0.0–1.2)
CALCIUM: 9.7 mg/dL (ref 8.7–10.2)
CO2: 24 mmol/L (ref 20–29)
CREATININE: 1 mg/dL (ref 0.57–1.00)
Chloride: 104 mmol/L (ref 96–106)
GFR calc Af Amer: 72 mL/min/{1.73_m2} (ref >59)
GFR, EST NON AFRICAN AMERICAN: 62 mL/min/{1.73_m2} (ref >59)
GLOBULIN, TOTAL: 2.5 g/dL (ref 1.5–4.5)
Glucose: 157 mg/dL — ABNORMAL HIGH (ref 65–99)
Potassium: 4.9 mmol/L (ref 3.5–5.2)
SODIUM: 145 mmol/L — AB (ref 134–144)
Total Protein: 6.8 g/dL (ref 6.0–8.5)

## 2016-09-23 LAB — SEDIMENTATION RATE: Sed Rate: 27 mm/hr (ref 0–40)

## 2016-09-23 LAB — 25-HYDROXYVITAMIN D LCMS D2+D3: 25-HYDROXY, VITAMIN D-3: 33 ng/mL

## 2016-09-23 LAB — 25-HYDROXY VITAMIN D LCMS D2+D3: 25-Hydroxy, Vitamin D: 33 ng/mL

## 2016-09-23 LAB — VITAMIN B12: Vitamin B-12: 1105 pg/mL (ref 232–1245)

## 2016-09-23 LAB — MAGNESIUM: Magnesium: 1.4 mg/dL — ABNORMAL LOW (ref 1.6–2.3)

## 2016-09-23 LAB — C-REACTIVE PROTEIN: CRP: 7.2 mg/L — ABNORMAL HIGH (ref 0.0–4.9)

## 2016-09-25 DIAGNOSIS — M5459 Other low back pain: Secondary | ICD-10-CM | POA: Insufficient documentation

## 2016-09-25 DIAGNOSIS — M7541 Impingement syndrome of right shoulder: Secondary | ICD-10-CM | POA: Insufficient documentation

## 2016-09-25 DIAGNOSIS — M25512 Pain in left shoulder: Secondary | ICD-10-CM

## 2016-09-25 DIAGNOSIS — G894 Chronic pain syndrome: Secondary | ICD-10-CM | POA: Insufficient documentation

## 2016-09-25 DIAGNOSIS — M47816 Spondylosis without myelopathy or radiculopathy, lumbar region: Secondary | ICD-10-CM | POA: Insufficient documentation

## 2016-09-25 DIAGNOSIS — M79605 Pain in left leg: Secondary | ICD-10-CM

## 2016-09-25 DIAGNOSIS — M5136 Other intervertebral disc degeneration, lumbar region: Secondary | ICD-10-CM | POA: Insufficient documentation

## 2016-09-25 DIAGNOSIS — M79604 Pain in right leg: Secondary | ICD-10-CM

## 2016-09-25 DIAGNOSIS — M545 Low back pain: Secondary | ICD-10-CM | POA: Insufficient documentation

## 2016-09-25 DIAGNOSIS — R7982 Elevated C-reactive protein (CRP): Secondary | ICD-10-CM | POA: Insufficient documentation

## 2016-09-25 DIAGNOSIS — G8929 Other chronic pain: Secondary | ICD-10-CM | POA: Insufficient documentation

## 2016-09-25 DIAGNOSIS — M25511 Pain in right shoulder: Secondary | ICD-10-CM

## 2016-09-25 NOTE — Progress Notes (Signed)
Patient's Name: Lorraine Hutchinson  MRN: [id number]  Referring Provider: Wayland Denis, PA-C  DOB: 06-17-1960  PCP: Wayland Denis, PA-C  DOS: 09/26/2016  Note by: Gaspar Cola, MD  Service setting: Ambulatory outpatient  Specialty: Interventional Pain Management  Location: [facility] (AMB) Pain Management Facility    Patient type: Established   Primary Reason(s) for Visit: Encounter for evaluation before starting new chronic pain management plan of care (Level of risk: moderate) CC: Back Pain (lower, across the upper back) and Shoulder Pain (both)  HPI  Lorraine Hutchinson is a 56 y.o. year old, female patient, who comes today for a follow-up evaluation to review the test results and decide on a treatment plan. She has Chronic depression; COPD, moderate ([place]); Essential hypertension; Former cigarette smoker; History of type 2 diabetes mellitus; Obesity, morbid, BMI 50 or higher ([place]); OSA (obstructive sleep apnea); Vitamin D deficiency, unspecified; Long term current use of opiate analgesic; Long term prescription opiate use; Opiate use; Chronic sacroiliac joint pain; Chronic neck pain (Tertiary Area of Pain)( (R>L); Encounter for screening for nutritional disorder; Chronic pain of right knee; Shoulder impingement syndrome, right; Osteoarthritis of facet joint of lumbar spine; Facet syndrome, lumbar ([place]); Pain of lumbar facet joint; Facet arthritis, degenerative, lumbar spine; DDD (degenerative disc disease), lumbar; Spondylosis of lumbar spine; Chronic pain of lower extremity (Secondary Area of Pain) (R>L); Chronic shoulder pain (Fourth Area of Pain) (R>L); Elevated C-reactive protein (CRP); Hypomagnesemia; Chronic pain syndrome; Chronic radicular lumbar pain (R) L4; Chronic low back pain (Primary Area of Pain) (Bilateral) (R>L); Generalized anxiety disorder; Hyperlipidemia, mixed; Type 2 diabetes mellitus with peripheral neuropathy (HCC); and Pain medication agreement signed on her problem list. Her  primarily concern today is the Back Pain (lower, across the upper back) and Shoulder Pain (both)  Pain Assessment: Location: Upper, Lower Back Radiating: goes down the right hip  and down the right side of leg and crosses  over to the front of leg to below the knee Onset: More than a month ago Duration: Chronic pain Quality: Aching, Constant, Shooting, Radiating Severity: 7 /10 (self-reported pain score)  Note: Reported level is inconsistent with clinical observations. Clinically the patient looks like a 3/10 Information on the proper use of the pain scale provided to the patient today Effect on ADL: pace self Timing: Constant Modifying factors: nothing helps  Lorraine Hutchinson comes in today for a follow-up visit after her initial evaluation on 09/18/2016. Today we went over the results of her tests. These were explained in "Layman's terms". During today's appointment we went over my diagnostic impression, as well as the proposed treatment plan.  According to the patient her primary area of pain is in her lower back she denies any precipitating factors. She feels this is related to her diagnosis of scoliosis. She did suffer a fall and 2017 which was worked related. She states that she tripped failed. She admits that this has been resolved. She has been following Dr. Rudene Christians. She feels like the back pain is midline; the right could be greater than the left. She denies any previous surgeries. She has had epidural steroid by Dr. Sharlet Salina. She does not feel that this was effective. She did have physical therapy in 2017 which she states was not effective. She did go to 3 times per week.  Her second area of pain is in her lower extremities. She feels that the right is greater than the left. She does have radicular symptoms that goes down into her calf. She has  tingling and weakness. She denies any recent physical therapy.  Her third area of pain is in her neck. She states that she does have some radicular  symptoms that goes into her shoulders. She denies any previous surgery, interventional therapy, physical therapy or recent images for her neck.  Her fourth area of pain is in her shoulders. The right is greater than the left. She does have occasional numbness and tingling down into her fingertips. She denies any previous surgery, interventional therapy, physical therapy. She did have recent images of her right shoulder.  Her last area of pain is in her right knee. She is status post total knee replacement approximately 10 year ago. She did have interventional procedures prior to surgery which were effective. She denies any recent physical therapy but she did have some recent images.  In considering the treatment plan options, Lorraine Hutchinson was reminded that I no longer take patients for medication management only. I asked her to let me know if she had no intention of taking advantage of the interventional therapies, so that we could make arrangements to provide this space to someone interested. I also made it clear that undergoing interventional therapies for the purpose of getting pain medications is very inappropriate on the part of a patient, and it will not be tolerated in this practice. This type of behavior would suggest true addiction and therefore it requires referral to an addiction specialist.   Topics covered today: the appropriate use of the pain scale, Lorraine Hutchinson's primary cause of pain, the results of her recent test(s), the significance of each one oth the test(s) anomalies and it's corresponding characteristic pain pattern(s), the treatment plan, treatment alternatives, the risks and possible complications of proposed treatment, medication side effects and the opioid analgesic risks and possible complications.  Further details on both, my assessment(s), as well as the proposed treatment plan, please see below.  Controlled Substance Pharmacotherapy Assessment REMS (Risk Evaluation  and Mitigation Strategy)  Analgesic: none Highest recorded MME/day: 30 mg/day MME/day: 0 mg/day Pill Count: None expected due to no prior prescriptions written by our practice. Lona Millard, RN  09/26/2016  8:00 AM  Sign at close encounter Safety precautions to be maintained throughout the outpatient stay will include: orient to surroundings, keep bed in low position, maintain call bell within reach at all times, provide assistance with transfer out of bed and ambulation.    Pharmacokinetics: Liberation and absorption (onset of action): WNL Distribution (time to peak effect): WNL Metabolism and excretion (duration of action): WNL         Pharmacodynamics: Desired effects: Analgesia: Ms. Celia reports >50% benefit. Functional ability: Patient reports that medication allows her to accomplish basic ADLs Clinically meaningful improvement in function (CMIF): Sustained CMIF goals met Perceived effectiveness: Described as relatively effective, allowing for increase in activities of daily living (ADL) Undesirable effects: Side-effects or Adverse reactions: None reported Monitoring: [place] PMP: Online review of the past 57-monthperiod previously conducted. Not applicable at this point since we have not taken over the patient's medication management yet. List of all Serum Drug Screening Test(s):  No results found for: AMPHSCRSER, BARBSCRSER, BENZOSCRSER, COCAINSCRSER, PCPSCRSER, THCSCRSER, OPIATESCRSER, [place] of all UDS test(s) done:  Lab Results  Component Value Date   SUMMARY FINAL 09/18/2016   Last UDS on record: Summary  Date Value Ref Range Status  09/18/2016 FINAL  Final    Comment:    ==================================================================== TOXASSURE COMP DRUG ANALYSIS,UR ==================================================================== Test  Result       Flag       Units Drug Present and Declared for Prescription  Verification   Duloxetine                     PRESENT      EXPECTED   Trazodone                      PRESENT      EXPECTED   1,3 chlorophenyl piperazine    PRESENT      EXPECTED    1,3-chlorophenyl piperazine is an expected metabolite of    trazodone.   Dextromethorphan               PRESENT      EXPECTED   Dextrorphan/Levorphanol        PRESENT      EXPECTED    Dextrorphan is an expected metabolite of dextromethorphan, an    over-the-counter or prescription cough suppressant. Dextrorphan    cannot be distinguished from the scheduled prescription    medication levorphanol by the method used for analysis.   Diltiazem                      PRESENT      EXPECTED   Guaifenesin                    PRESENT      EXPECTED    Guaifenesin may be administered as an over-the-counter or    prescription drug; it may also be present as a breakdown product    of methocarbamol. Drug Present not Declared for Prescription Verification   Gabapentin                     PRESENT      UNEXPECTED   Citalopram                     PRESENT      UNEXPECTED   Desmethylcitalopram            PRESENT      UNEXPECTED    Desmethylcitalopram is an expected metabolite of citalopram or    the enantiomeric form, escitalopram.   Acetaminophen                  PRESENT      UNEXPECTED Drug Absent but Declared for Prescription Verification   Diclofenac                     Not Detected UNEXPECTED    Diclofenac, as indicated in the declared medication list, is not    always detected even when used as directed. ==================================================================== Test                      Result    Flag   Units      Ref Range   Creatinine              174              mg/dL      >=20 ==================================================================== Declared Medications:  The flagging and interpretation on this report are based on the  following declared medications.  Unexpected results may arise from   inaccuracies in the declared medications.  **Note: The testing scope of this panel includes these medications:  Dextromethorphan  Diltiazem  Duloxetine  Guaifenesin  Trazodone  **Note: The testing scope of this panel does not include small to  moderate amounts of these reported medications:  Diclofenac  **Note: The testing scope of this panel does not include following  reported medications:  Calcium (Calcium/Vitamin D)  Cholecalciferol  Chromium  Cinnamon Bark  Folic acid  Hydrochlorothiazide (Lisinopril-HCTZ)  Insulin (NovoLog)  Lisinopril (Lisinopril-HCTZ)  Lutein  Melatonin  Metformin  Multivitamin  Omeprazole  Potassium  Pravastatin  Supplement (Lipoic Acid)  Vitamin B  Vitamin B (Biotin)  Vitamin D (Calcium/Vitamin D) ==================================================================== For clinical consultation, please call [phone]. ====================================================================    UDS interpretation: No unexpected findings.          Medication Assessment Form: Patient introduced to form today Treatment compliance: Treatment may start today if patient agrees with proposed plan. Evaluation of compliance is not applicable at this point Risk Assessment Profile: Aberrant behavior: See initial evaluations. None observed or detected today Comorbid factors increasing risk of overdose: See initial evaluation. No additional risks detected today Medical Psychology Evaluation: Please see scanned results in medical record.     Opioid Risk Tool - 09/26/16 0759      Family History of Substance Abuse   Alcohol Negative   Illegal Drugs Negative   Rx Drugs Negative     Personal History of Substance Abuse   Alcohol Negative   Illegal Drugs Negative   Rx Drugs Negative     Age   Age between 41-45 years  No     History of Preadolescent Sexual Abuse   History of Preadolescent Sexual Abuse Negative or Female     Psychological Disease    Psychological Disease Negative   ADD Negative   OCD Negative   Bipolar Negative   Schizophrenia Negative   Depression Positive     Total Score   Opioid Risk Tool Scoring 1   Opioid Risk Interpretation Low Risk     ORT Scoring interpretation table:  Score <3 = Low Risk for SUD  Score between 4-7 = Moderate Risk for SUD  Score >8 = High Risk for Opioid Abuse   Risk Mitigation Strategies:  Patient opioid safety counseling: Completed today. Counseling provided to patient as per "Patient Counseling Document". Document signed by patient, attesting to counseling and understanding Patient-Prescriber Agreement (PPA): Obtained today.  Controlled substance notification to other providers: Written and sent today.  Pharmacologic Plan: Today we may be taking over the patient's pharmacological regimen. See below             Laboratory Chemistry  Inflammation Markers (CRP: Acute Phase) (ESR: Chronic Phase) Lab Results  Component Value Date   CRP 7.2 (H) 09/18/2016   ESRSEDRATE 27 09/18/2016                 Renal Function Markers Lab Results  Component Value Date   BUN 19 09/18/2016   CREATININE 1.00 09/18/2016   GFRAA 72 09/18/2016   GFRNONAA 62 09/18/2016                 Hepatic Function Markers Lab Results  Component Value Date   AST 17 09/18/2016   ALT 18 09/18/2016   ALBUMIN 4.3 09/18/2016   ALKPHOS 33 (L) 09/18/2016                 Electrolytes Lab Results  Component Value Date   NA 145 (H) 09/18/2016   K 4.9 09/18/2016   CL 104 09/18/2016   CALCIUM 9.7 09/18/2016   MG 1.4 (L) 09/18/2016  Neuropathy Markers Lab Results  Component Value Date   VITAMINB12 1,105 09/18/2016                 Bone Pathology Markers Lab Results  Component Value Date   ALKPHOS 33 (L) 09/18/2016   25OHVITD1 33 09/18/2016   25OHVITD2 <1.0 09/18/2016   25OHVITD3 33 09/18/2016   CALCIUM 9.7 09/18/2016                 Coagulation Parameters No results found for:  INR, LABPROT, APTT, PLT               Cardiovascular Markers No results found for: BNP, HGB, HCT               Note: Lab results reviewed.  Recent Diagnostic Imaging Review  Ct Knee Right Wo Contrast Result Date: 08/31/2016 CLINICAL DATA:  Right knee pain after total knee replacement 2010. EXAM: CT OF THE RIGHT KNEE WITHOUT CONTRAST TECHNIQUE: Multidetector CT imaging of the right knee was performed according to the standard protocol. Multiplanar CT image reconstructions were also generated. COMPARISON:  Right knee x-rays dated May 21, 2008. FINDINGS: Bones/Joint/Cartilage Postsurgical changes related to prior right total knee arthroplasty. There is excessive internal rotation of the tibial component, measuring 33 degrees. There is no excessive internal rotation of the femoral component. There is no evidence of periprostatic fracture or lucency. No significant knee joint effusion. Ligaments Suboptimally assessed by CT. Muscles and Tendons No focal abnormality. Soft tissues Scattered minimal subcutaneous edema.  Otherwise unremarkable. IMPRESSION: 1. Postsurgical changes related to prior right total knee arthroplasty without acute osseous abnormality. 2. Excessive internal rotation of the tibial component, measuring 33 degrees. Electronically Signed   By: Titus Dubin M.D.   On: 08/31/2016 17:06   Lumbosacral Imaging: Lumbar MR wo contrast:  Results for orders placed in visit on 07/25/06  MR L Spine Ltd W/O Cm   Narrative * PRIOR REPORT IMPORTED FROM AN EXTERNAL SYSTEM *   PRIOR REPORT IMPORTED FROM THE [facility] WORKFLOW SYSTEM   REASON FOR EXAM:    low back pain  COMMENTS:   PROCEDURE:     MR  - MR LUMBAR SPINE WO CONTRAST  - Jul 25 2006  9:39AM   RESULT:       Multiplanar/multisequence imaging of the lumbar spine was  obtained.  Multiple benign-appearing hemangiomas are present.  No evidence  of disc protrusion or spinal stenosis. Neural foramen are patent.  The  lumbar cord is normal.    IMPRESSION:   Multilevel disc degeneration. No evidence of disc protrusion or spinal  stenosis.   Thank you for the opportunity to contribute to the care of your patient.       Knee Imaging: Knee-L MR w contrast:  Results for orders placed in visit on 05/09/09  MR Knee Left  Wo Contrast   Narrative * PRIOR REPORT IMPORTED FROM AN EXTERNAL SYSTEM *   PRIOR REPORT IMPORTED FROM THE [facility] WORKFLOW SYSTEM   REASON FOR EXAM:    Left Knee Pain Eval Meniscus Tear  COMMENTS:   PROCEDURE:     MR  - MR KNEE LT  WO CONTRAST  - May 09 2009  8:57AM   RESULT:   HISTORY: Knee pain.   PROCEDURE AND FINDINGS:  Multiplanar/multisequence imaging of the left  knee  is obtained.   No prior studies are available for comparison.   There is a horizontal tear of the posterior horn of the medial meniscus.  The  cruciate ligaments are intact. The collateral ligaments are intact. No  acute  bony abnormality identified. Prominent osteophyte formation is present.  Chondromalacia patellae noted. Subchondral edema is noted about the  patella.  Mild edema is noted about the anterior tibial plateau. Mild changes of  prepatellar bursitis are noted. The quadriceps and patellar tendons are  intact.   IMPRESSION:      Subtle horizontal tear of the posterior horn of the  medial  meniscus. Mild tibial osteophyte formation is noted. Patellofemoral  degenerative changes are present. There are prominent changes of  chondromalacia patellae. Prominent subchondral edema noted about the  patella  and this is most likely degenerative.   Thank you for this opportunity to contribute to the care of your patient.       Knee-R CT wo contrast:  Results for orders placed during the hospital encounter of 08/31/16  CT KNEE RIGHT WO CONTRAST   Narrative CLINICAL DATA:  Right knee pain after total knee replacement 2010.  EXAM: CT OF THE RIGHT KNEE WITHOUT CONTRAST  TECHNIQUE: Multidetector CT imaging of the  right knee was performed according to the standard protocol. Multiplanar CT image reconstructions were also generated.  COMPARISON:  Right knee x-rays dated May 21, 2008.  FINDINGS: Bones/Joint/Cartilage  Postsurgical changes related to prior right total knee arthroplasty. There is excessive internal rotation of the tibial component, measuring 33 degrees. There is no excessive internal rotation of the femoral component. There is no evidence of periprostatic fracture or lucency.  No significant knee joint effusion.  Ligaments  Suboptimally assessed by CT.  Muscles and Tendons  No focal abnormality.  Soft tissues  Scattered minimal subcutaneous edema.  Otherwise unremarkable.  IMPRESSION: 1. Postsurgical changes related to prior right total knee arthroplasty without acute osseous abnormality. 2. Excessive internal rotation of the tibial component, measuring 33 degrees.   Electronically Signed   By: Titus Dubin M.D.   On: 08/31/2016 17:06    Knee-R DG 1-2 views:  Results for orders placed in visit on 05/21/08  DG Knee 1-2 Views Right   Narrative * PRIOR REPORT IMPORTED FROM AN EXTERNAL SYSTEM *   PRIOR REPORT IMPORTED FROM THE [facility] WORKFLOW SYSTEM   REASON FOR EXAM:    postop  COMMENTS:   Bedside (portable):Y   PROCEDURE:     DXR - DXR KNEE RIGHT AP AND LATERAL  - May 21 2008 11:31AM   RESULT:     The patient is status post total right knee replacement. The  femoral and acetabular components appear intact. Skin staples and surgical  drains are identified about the right knee. The visualized osseous  structures appear unremarkable. There is no evidence of hardware loosening  or failure.   IMPRESSION:     Status post total right knee replacement.   The remainder of the interpretation will be left to the performing  physician.   Thank you for this opportunity to contribute to the care of your patient.       Knee-R DG 4 views:  Results for orders  placed in visit on 03/12/05  DG Knee Complete 4 Views Right   Narrative * PRIOR REPORT IMPORTED FROM AN EXTERNAL SYSTEM *   PRIOR REPORT IMPORTED FROM THE [facility] WORKFLOW SYSTEM   REASON FOR EXAM:  fall  COMMENTS:   PROCEDURE:     DXR - DXR KNEE RT COMP WITH OBLIQUES  - Mar 12 2005  5:47PM   RESULT:     Four  views of the RIGHT knee show no fracture or dislocation.  There is degenerative spurring at the knee medially and laterally, but  more  prominent laterally.  The patella is intact.   IMPRESSION:   1)No acute bony abnormalities are noted about the knee.   2)Mild arthritic spurring is seen particularly on the RIGHT.   Thank you for this opportunity to contribute to the care of your patient.       Note: Results of ordered imaging test(s) reviewed and explained to patient in Layman's terms. Copy of results provided to patient  Meds   Current Outpatient Prescriptions:  .  Alpha-Lipoic Acid 100 MG CAPS, Take by mouth., Disp: , Rfl:  .  B Complex-Biotin-FA (TH VITAMIN B 50/B-COMPLEX) TABS, Take 1 tablet by mouth daily., Disp: , Rfl:  .  B-D UF III MINI PEN NEEDLES 31G X 5 MM MISC, U UTD 2 XD, Disp: , Rfl: 3 .  Calcium-Vitamin D 600-200 MG-UNIT tablet, Take 1 tablet by mouth 2 (two) times daily., Disp: , Rfl:  .  Cholecalciferol (VITAMIN D3) 1000 units CAPS, Take by mouth., Disp: , Rfl:  .  Chromium Picolinate 1000 MCG TABS, Take by mouth., Disp: , Rfl:  .  Cinnamon 500 MG capsule, Take 500 mg by mouth daily., Disp: , Rfl:  .  Dextromethorphan-Guaifenesin 60-1200 MG 12hr tablet, Take 1 tablet by mouth 2 (two) times daily., Disp: , Rfl:  .  diclofenac (VOLTAREN) 75 MG EC tablet, Take 75 mg by mouth 2 (two) times daily., Disp: , Rfl:  .  diltiazem (DILACOR XR) 180 MG 24 hr capsule, Take 180 mg by mouth 2 (two) times daily., Disp: , Rfl:  .  DULoxetine (CYMBALTA) 30 MG capsule, Take 30 mg by mouth 2 (two) times daily., Disp: , Rfl:  .  insulin aspart protamine - aspart (NOVOLOG MIX  70/30 FLEXPEN) (70-30) 100 UNIT/ML FlexPen, Inject 60 Units into the skin daily before breakfast. 62 units before supper, Disp: , Rfl:  .  lisinopril-hydrochlorothiazide (PRINZIDE,ZESTORETIC) 20-25 MG tablet, Take 1 tablet by mouth daily., Disp: , Rfl:  .  Lutein 20 MG CAPS, Take by mouth daily. , Disp: , Rfl:  .  Magnesium Oxide 500 MG CAPS, Take 1 capsule (500 mg total) by mouth 2 (two) times daily at 8 am and 10 pm., Disp: 90 capsule, Rfl: 0 .  Melatonin 10 MG TABS, Take by mouth every evening., Disp: , Rfl:  .  metFORMIN (GLUCOPHAGE-XR) 500 MG 24 hr tablet, TAKE 3 TABLETS BY MOUTH DAILY WITH DINNER, Disp: , Rfl:  .  Multiple Vitamins-Minerals (ABC PLUS SENIOR ADULTS 50+ PO), Take by mouth., Disp: , Rfl:  .  omeprazole (PRILOSEC) 20 MG capsule, Take 20 mg by mouth daily., Disp: , Rfl:  .  oxyCODONE (OXY IR/ROXICODONE) 5 MG immediate release tablet, Take 1 tablet (5 mg total) by mouth every 8 (eight) hours as needed for severe pain., Disp: 90 tablet, Rfl: 0 .  Potassium 99 MG TABS, Take 1 tablet by mouth daily., Disp: , Rfl:  .  pravastatin (PRAVACHOL) 40 MG tablet, Take 40 mg by mouth daily., Disp: , Rfl:  .  traZODone (DESYREL) 50 MG tablet, Take 50 mg by mouth daily., Disp: , Rfl:  .  metFORMIN (GLUCOPHAGE) 1000 MG tablet, Take 1,000 mg by mouth 2 (two) times daily., Disp: , Rfl:   ROS  Constitutional: Denies any fever or chills Gastrointestinal: No reported hemesis, hematochezia, vomiting, or acute GI distress Musculoskeletal: Denies any acute onset  joint swelling, redness, loss of ROM, or weakness Neurological: No reported episodes of acute onset apraxia, aphasia, dysarthria, agnosia, amnesia, paralysis, loss of coordination, or loss of consciousness  Allergies  Ms. Mccamy is allergic to codeine.  [place]  Drug: Ms. Lansdale  has no drug history on file. Alcohol:  reports that she does not drink alcohol. Tobacco:  reports that she quit smoking about 20 months ago. Her smoking use  included Cigarettes. She has a 40.00 pack-year smoking history. She has never used smokeless tobacco. Medical:  has a past medical history of Cancer ([place]); Diabetes mellitus without complication ([place]); Hyperlipidemia; Hypertension; Scoliosis; and Sleep apnea. Surgical: Ms. Hershkowitz  has a past surgical history that includes Replacement total knee (Right); Appendectomy; Cesarean section; Abdominal hysterectomy; and Tonsillectomy. Family: family history includes Cancer in her mother; Diabetes in her father and mother.  Constitutional Exam  General appearance: Well nourished, well developed, and well hydrated. In no apparent acute distress Vitals:   09/26/16 0746  BP: 127/81  Pulse: 90  Resp: 16  Temp: 98.1 F (36.7 C)  SpO2: 97%  Weight: (!) 384 lb (174.2 kg)  Height: '5\' 9"'  (1.753 m)   BMI Assessment: Estimated body mass index is 56.71 kg/m as calculated from the following:   Height as of this encounter: '5\' 9"'  (1.753 m).   Weight as of this encounter: 384 lb (174.2 kg).  BMI interpretation table: BMI level Category Range association with higher incidence of chronic pain  <18 kg/m2 Underweight   18.5-24.9 kg/m2 Ideal body weight   25-29.9 kg/m2 Overweight Increased incidence by 20%  30-34.9 kg/m2 Obese (Class I) Increased incidence by 68%  35-39.9 kg/m2 Severe obesity (Class II) Increased incidence by 136%  >40 kg/m2 Extreme obesity (Class III) Increased incidence by 254%   BMI Readings from Last 4 Encounters:  09/26/16 56.71 kg/m  09/18/16 58.77 kg/m  07/11/15 55.72 kg/m  07/07/15 55.58 kg/m   Wt Readings from Last 4 Encounters:  09/26/16 (!) 384 lb (174.2 kg)  09/18/16 (!) 398 lb (180.5 kg)  07/11/15 (!) 377 lb 4.8 oz (171.1 kg)  07/07/15 (!) 376 lb 6.4 oz (170.7 kg)  Psych/Mental status: Alert, oriented x 3 (person, place, & time)       Eyes: PERLA Respiratory: No evidence of acute respiratory distress  Cervical Spine Area Exam  Skin & Axial Inspection: No masses,  redness, edema, swelling, or associated skin lesions Alignment: Symmetrical Functional ROM: Unrestricted ROM      Stability: No instability detected Muscle Tone/Strength: Functionally intact. No obvious neuro-muscular anomalies detected. Sensory (Neurological): Unimpaired Palpation: No palpable anomalies              Upper Extremity (UE) Exam    Side: Right upper extremity  Side: Left upper extremity  Skin & Extremity Inspection: Skin color, temperature, and hair growth are WNL. No peripheral edema or cyanosis. No masses, redness, swelling, asymmetry, or associated skin lesions. No contractures.  Skin & Extremity Inspection: Skin color, temperature, and hair growth are WNL. No peripheral edema or cyanosis. No masses, redness, swelling, asymmetry, or associated skin lesions. No contractures.  Functional ROM: Unrestricted ROM          Functional ROM: Unrestricted ROM          Muscle Tone/Strength: Functionally intact. No obvious neuro-muscular anomalies detected.  Muscle Tone/Strength: Functionally intact. No obvious neuro-muscular anomalies detected.  Sensory (Neurological): Unimpaired          Sensory (Neurological): Unimpaired  Palpation: No palpable anomalies              Palpation: No palpable anomalies              Specialized Test(s): Deferred         Specialized Test(s): Deferred          Thoracic Spine Area Exam  Skin & Axial Inspection: No masses, redness, or swelling Alignment: Symmetrical Functional ROM: Unrestricted ROM Stability: No instability detected Muscle Tone/Strength: Functionally intact. No obvious neuro-muscular anomalies detected. Sensory (Neurological): Unimpaired Muscle strength & Tone: No palpable anomalies  Lumbar Spine Area Exam  Skin & Axial Inspection: No masses, redness, or swelling Alignment: Symmetrical Functional ROM: Decreased ROM      Stability: No instability detected Muscle Tone/Strength: Functionally intact. No obvious neuro-muscular  anomalies detected. Sensory (Neurological): Movement-associated pain Palpation: Complains of area being tender to palpation       Provocative Tests: Lumbar Hyperextension and rotation test: Positive bilaterally for facet joint pain. Lumbar Lateral bending test: evaluation deferred today       Patrick's Maneuver: Positive for bilateral S-I arthralgia              Gait & Posture Assessment  Ambulation: Limited Gait: Antalgic Posture: WNL   Lower Extremity Exam    Side: Right lower extremity  Side: Left lower extremity  Skin & Extremity Inspection: Skin color, temperature, and hair growth are WNL. No peripheral edema or cyanosis. No masses, redness, swelling, asymmetry, or associated skin lesions. No contractures.  Skin & Extremity Inspection: Skin color, temperature, and hair growth are WNL. No peripheral edema or cyanosis. No masses, redness, swelling, asymmetry, or associated skin lesions. No contractures.  Functional ROM: Unrestricted ROM          Functional ROM: Unrestricted ROM          Muscle Tone/Strength: Functionally intact. No obvious neuro-muscular anomalies detected.  Muscle Tone/Strength: Functionally intact. No obvious neuro-muscular anomalies detected.  Sensory (Neurological): Unimpaired  Sensory (Neurological): Unimpaired  Palpation: No palpable anomalies  Palpation: No palpable anomalies   Assessment & Plan  Primary Diagnosis & Pertinent Problem List: The primary encounter diagnosis was Chronic pain syndrome. Diagnoses of Chronic low back pain (Primary Area of Pain) (Bilateral) (R>L), Chronic pain of lower extremity (Secondary Area of Pain) (R>L), Chronic neck pain (Tertiary Area of Pain)( (R>L), Chronic pain of both shoulders, Shoulder impingement syndrome, right, Osteoarthritis of facet joint of lumbar spine, Facet syndrome, lumbar (HCC), Pain of lumbar facet joint, Facet arthritis, degenerative, lumbar spine, DDD (degenerative disc disease), lumbar, Spondylosis of lumbar  spine, Chronic radicular lumbar pain (R) L4, Chronic sacroiliac joint pain, Chronic pain of right knee, Elevated C-reactive protein (CRP), Hypomagnesemia, History of type 2 diabetes mellitus, Obesity, morbid, BMI 50 or higher ([place]), Long term prescription opiate use, Opiate use, and Pain medication agreement signed were also pertinent to this visit.  Visit Diagnosis: 1. Chronic pain syndrome   2. Chronic low back pain (Primary Area of Pain) (Bilateral) (R>L)   3. Chronic pain of lower extremity (Secondary Area of Pain) (R>L)   4. Chronic neck pain (Tertiary Area of Pain)( (R>L)   5. Chronic pain of both shoulders   6. Shoulder impingement syndrome, right   7. Osteoarthritis of facet joint of lumbar spine   8. Facet syndrome, lumbar ([place])   9. Pain of lumbar facet joint   10. Facet arthritis, degenerative, lumbar spine   11. DDD (degenerative disc disease), lumbar   12. Spondylosis of  lumbar spine   13. Chronic radicular lumbar pain (R) L4   14. Chronic sacroiliac joint pain   15. Chronic pain of right knee   16. Elevated C-reactive protein (CRP)   17. Hypomagnesemia   18. History of type 2 diabetes mellitus   19. Obesity, morbid, BMI 50 or higher ([place])   20. Long term prescription opiate use   21. Opiate use   22. Pain medication agreement signed    Problems updated and reviewed during this visit: Problem  Chronic Pain Syndrome  Pain Medication Agreement Signed   [facility]   Generalized Anxiety Disorder  Hyperlipidemia, Mixed  Type 2 Diabetes Mellitus With Peripheral Neuropathy (Hcc)   Time Note: Greater than 50% of the 40 minute(s) of face-to-face time spent with Ms. Demps, was spent in counseling/coordination of care regarding: the appropriate use of the pain scale, Ms. Paye's primary cause of pain, the results of her recent test(s), the significance of each one oth the test(s) anomalies and it's corresponding characteristic pain pattern(s), the treatment plan,  treatment alternatives, the risks and possible complications of proposed treatment and medication side effects.  Plan of Care  Pharmacotherapy (Medications Ordered): Meds ordered this encounter  Medications  . Magnesium Oxide 500 MG CAPS    Sig: Take 1 capsule (500 mg total) by mouth 2 (two) times daily at 8 am and 10 pm.    Dispense:  90 capsule    Refill:  0    Do not place medication on "Automatic Refill". Fill one day early if pharmacy is closed on scheduled refill date.  Marland Kitchen oxyCODONE (OXY IR/ROXICODONE) 5 MG immediate release tablet    Sig: Take 1 tablet (5 mg total) by mouth every 8 (eight) hours as needed for severe pain.    Dispense:  90 tablet    Refill:  0    Do not place this medication, or any other prescription from our practice, on "Automatic Refill". Patient may have prescription filled one day early if pharmacy is closed on scheduled refill date. Do not fill until: 09/26/16 To last until: 10/26/16   Procedure Orders    No procedure(s) ordered today   Lab Orders  No laboratory test(s) ordered today    Imaging Orders     MR LUMBAR SPINE WO CONTRAST  Referral Orders     Ambulatory referral to General Surgery  Pharmacological management options:  Opioid Analgesics: We'll take over management today. See above orders Membrane stabilizer: We have discussed the possibility of optimizing this mode of therapy, if tolerated Muscle relaxant: We have discussed the possibility of a trial NSAID: We have discussed the possibility of a trial Other analgesic(s): To be determined at a later time   Interventional management options: Planned, scheduled, and/or pending:    None at this time.    Considering:   possible diagnostic right lumbar epidural steroid injection Possible diagnostic right lumbar facet injection right lumbar radiofrequency ablation Possible diagnostic right shoulder steroid injection Possible right suprascapular nerve block Possible right scapular  radiofrequency ablation Possible bilateral cervical epidural steroid injection Possible bilateral cervical facet block Bilateral cervical radiofrequency ablation Possible diagnostic Right genicular nerve block Right knee radiofrequency ablation   PRN Procedures:   To be determined at a later time   Provider-requested follow-up: Return in about 2 weeks (around 10/10/2016) for F/U eval with Dr. Dossie Arbour after test completion.  Future Appointments Date Time Provider [place]  10/24/2016 10:15 AM Milinda Pointer, MD [facility] None    Primary  Care Physician: Wayland Denis, PA-C Location: [facility] Note by: Gaspar Cola, MD Date: 09/26/2016; Time: 9:00 AM

## 2016-09-26 ENCOUNTER — Encounter: Payer: Self-pay | Admitting: Pain Medicine

## 2016-09-26 ENCOUNTER — Ambulatory Visit: Payer: BLUE CROSS/BLUE SHIELD | Attending: Pain Medicine | Admitting: Pain Medicine

## 2016-09-26 ENCOUNTER — Telehealth: Payer: Self-pay | Admitting: *Deleted

## 2016-09-26 VITALS — BP 127/81 | HR 90 | Temp 98.1°F | Resp 16 | Ht 69.0 in | Wt 384.0 lb

## 2016-09-26 DIAGNOSIS — E785 Hyperlipidemia, unspecified: Secondary | ICD-10-CM | POA: Diagnosis not present

## 2016-09-26 DIAGNOSIS — Z8639 Personal history of other endocrine, nutritional and metabolic disease: Secondary | ICD-10-CM

## 2016-09-26 DIAGNOSIS — Z809 Family history of malignant neoplasm, unspecified: Secondary | ICD-10-CM | POA: Insufficient documentation

## 2016-09-26 DIAGNOSIS — M25512 Pain in left shoulder: Secondary | ICD-10-CM

## 2016-09-26 DIAGNOSIS — M419 Scoliosis, unspecified: Secondary | ICD-10-CM | POA: Insufficient documentation

## 2016-09-26 DIAGNOSIS — M5136 Other intervertebral disc degeneration, lumbar region: Secondary | ICD-10-CM | POA: Insufficient documentation

## 2016-09-26 DIAGNOSIS — Z9889 Other specified postprocedural states: Secondary | ICD-10-CM | POA: Insufficient documentation

## 2016-09-26 DIAGNOSIS — M79604 Pain in right leg: Secondary | ICD-10-CM | POA: Diagnosis not present

## 2016-09-26 DIAGNOSIS — Z794 Long term (current) use of insulin: Secondary | ICD-10-CM | POA: Insufficient documentation

## 2016-09-26 DIAGNOSIS — G894 Chronic pain syndrome: Secondary | ICD-10-CM

## 2016-09-26 DIAGNOSIS — M25511 Pain in right shoulder: Secondary | ICD-10-CM | POA: Diagnosis not present

## 2016-09-26 DIAGNOSIS — M545 Low back pain: Secondary | ICD-10-CM

## 2016-09-26 DIAGNOSIS — M542 Cervicalgia: Secondary | ICD-10-CM | POA: Diagnosis not present

## 2016-09-26 DIAGNOSIS — Z96651 Presence of right artificial knee joint: Secondary | ICD-10-CM | POA: Insufficient documentation

## 2016-09-26 DIAGNOSIS — M5416 Radiculopathy, lumbar region: Secondary | ICD-10-CM | POA: Diagnosis not present

## 2016-09-26 DIAGNOSIS — M7541 Impingement syndrome of right shoulder: Secondary | ICD-10-CM | POA: Diagnosis not present

## 2016-09-26 DIAGNOSIS — M25561 Pain in right knee: Secondary | ICD-10-CM | POA: Diagnosis not present

## 2016-09-26 DIAGNOSIS — M4696 Unspecified inflammatory spondylopathy, lumbar region: Secondary | ICD-10-CM | POA: Diagnosis not present

## 2016-09-26 DIAGNOSIS — I10 Essential (primary) hypertension: Secondary | ICD-10-CM | POA: Insufficient documentation

## 2016-09-26 DIAGNOSIS — R7982 Elevated C-reactive protein (CRP): Secondary | ICD-10-CM

## 2016-09-26 DIAGNOSIS — Z79891 Long term (current) use of opiate analgesic: Secondary | ICD-10-CM

## 2016-09-26 DIAGNOSIS — Z0289 Encounter for other administrative examinations: Secondary | ICD-10-CM

## 2016-09-26 DIAGNOSIS — M79605 Pain in left leg: Secondary | ICD-10-CM | POA: Diagnosis not present

## 2016-09-26 DIAGNOSIS — Z859 Personal history of malignant neoplasm, unspecified: Secondary | ICD-10-CM | POA: Insufficient documentation

## 2016-09-26 DIAGNOSIS — G4730 Sleep apnea, unspecified: Secondary | ICD-10-CM | POA: Diagnosis not present

## 2016-09-26 DIAGNOSIS — E119 Type 2 diabetes mellitus without complications: Secondary | ICD-10-CM | POA: Insufficient documentation

## 2016-09-26 DIAGNOSIS — Z885 Allergy status to narcotic agent status: Secondary | ICD-10-CM | POA: Insufficient documentation

## 2016-09-26 DIAGNOSIS — Z87891 Personal history of nicotine dependence: Secondary | ICD-10-CM | POA: Diagnosis not present

## 2016-09-26 DIAGNOSIS — M51369 Other intervertebral disc degeneration, lumbar region without mention of lumbar back pain or lower extremity pain: Secondary | ICD-10-CM

## 2016-09-26 DIAGNOSIS — M533 Sacrococcygeal disorders, not elsewhere classified: Secondary | ICD-10-CM | POA: Diagnosis not present

## 2016-09-26 DIAGNOSIS — M5441 Lumbago with sciatica, right side: Secondary | ICD-10-CM

## 2016-09-26 DIAGNOSIS — Z833 Family history of diabetes mellitus: Secondary | ICD-10-CM | POA: Insufficient documentation

## 2016-09-26 DIAGNOSIS — M5459 Other low back pain: Secondary | ICD-10-CM

## 2016-09-26 DIAGNOSIS — G8929 Other chronic pain: Secondary | ICD-10-CM

## 2016-09-26 DIAGNOSIS — Z6841 Body Mass Index (BMI) 40.0 and over, adult: Secondary | ICD-10-CM | POA: Insufficient documentation

## 2016-09-26 DIAGNOSIS — Z90710 Acquired absence of both cervix and uterus: Secondary | ICD-10-CM | POA: Insufficient documentation

## 2016-09-26 DIAGNOSIS — M47816 Spondylosis without myelopathy or radiculopathy, lumbar region: Secondary | ICD-10-CM

## 2016-09-26 DIAGNOSIS — F1190 Opioid use, unspecified, uncomplicated: Secondary | ICD-10-CM

## 2016-09-26 MED ORDER — OXYCODONE HCL 5 MG PO TABS: 5.0000 mg | ORAL_TABLET | Freq: Three times a day (TID) | ORAL | 0 refills | Status: DC | PRN

## 2016-09-26 MED ORDER — MAGNESIUM OXIDE -MG SUPPLEMENT 500 MG PO CAPS: 1.0000 | ORAL_CAPSULE | Freq: Two times a day (BID) | ORAL | 0 refills | Status: AC

## 2016-09-26 NOTE — Patient Instructions (Addendum)
____________________________________________________________________________________________  Pain Scale  Introduction: The pain score used by this practice is the Verbal Numerical Rating Scale (VNRS-11). This is an 11-point scale. It is for adults and children 10 years or older. There are significant differences in how the pain score is reported, used, and applied. Forget everything you learned in the past and learn this scoring system.  General Information: The scale should reflect your current level of pain. Unless you are specifically asked for the level of your worst pain, or your average pain. If you are asked for one of these two, then it should be understood that it is over the past 24 hours.  Basic Activities of Daily Living (ADL): Personal hygiene, dressing, eating, transferring, and using restroom.  Instructions: Most patients tend to report their level of pain as a combination of two factors, their physical pain and their psychosocial pain. This last one is also known as "suffering" and it is reflection of how physical pain affects you socially and psychologically. From now on, report them separately. From this point on, when asked to report your pain level, report only your physical pain. Use the following table for reference.  Pain Clinic Pain Levels (0-5/10)  Pain Level Score  Description  No Pain 0   Mild pain 1 Nagging, annoying, but does not interfere with basic activities of daily living (ADL). Patients are able to eat, bathe, get dressed, toileting (being able to get on and off the toilet and perform personal hygiene functions), transfer (move in and out of bed or a chair without assistance), and maintain continence (able to control bladder and bowel functions). Blood pressure and heart rate are unaffected. A normal heart rate for a healthy adult ranges from 60 to 100 bpm (beats per minute).   Mild to moderate pain 2 Noticeable and distracting. Impossible to hide from other  people. More frequent flare-ups. Still possible to adapt and function close to normal. It can be very annoying and may have occasional stronger flare-ups. With discipline, patients may get used to it and adapt.   Moderate pain 3 Interferes significantly with activities of daily living (ADL). It becomes difficult to feed, bathe, get dressed, get on and off the toilet or to perform personal hygiene functions. Difficult to get in and out of bed or a chair without assistance. Very distracting. With effort, it can be ignored when deeply involved in activities.   Moderately severe pain 4 Impossible to ignore for more than a few minutes. With effort, patients may still be able to manage work or participate in some social activities. Very difficult to concentrate. Signs of autonomic nervous system discharge are evident: dilated pupils (mydriasis); mild sweating (diaphoresis); sleep interference. Heart rate becomes elevated (>115 bpm). Diastolic blood pressure (lower number) rises above 100 mmHg. Patients find relief in laying down and not moving.   Severe pain 5 Intense and extremely unpleasant. Associated with frowning face and frequent crying. Pain overwhelms the senses.  Ability to do any activity or maintain social relationships becomes significantly limited. Conversation becomes difficult. Pacing back and forth is common, as getting into a comfortable position is nearly impossible. Pain wakes you up from deep sleep. Physical signs will be obvious: pupillary dilation; increased sweating; goosebumps; brisk reflexes; cold, clammy hands and feet; nausea, vomiting or dry heaves; loss of appetite; significant sleep disturbance with inability to fall asleep or to remain asleep. When persistent, significant weight loss is observed due to the complete loss of appetite and sleep deprivation.  Blood   pressure and heart rate becomes significantly elevated. Caution: If elevated blood pressure triggers a pounding headache  associated with blurred vision, then the patient should immediately seek attention at an urgent or emergency care unit, as these may be signs of an impending stroke.    Emergency Department Pain Levels (6-10/10)  Emergency Room Pain 6 Severely limiting. Requires emergency care and should not be seen or managed at an outpatient pain management facility. Communication becomes difficult and requires great effort. Assistance to reach the emergency department may be required. Facial flushing and profuse sweating along with potentially dangerous increases in heart rate and blood pressure will be evident.   Distressing pain 7 Self-care is very difficult. Assistance is required to transport, or use restroom. Assistance to reach the emergency department will be required. Tasks requiring coordination, such as bathing and getting dressed become very difficult.   Disabling pain 8 Self-care is no longer possible. At this level, pain is disabling. The individual is unable to do even the most "basic" activities such as walking, eating, bathing, dressing, transferring to a bed, or toileting. Fine motor skills are lost. It is difficult to think clearly.   Incapacitating pain 9 Pain becomes incapacitating. Thought processing is no longer possible. Difficult to remember your own name. Control of movement and coordination are lost.   The worst pain imaginable 10 At this level, most patients pass out from pain. When this level is reached, collapse of the autonomic nervous system occurs, leading to a sudden drop in blood pressure and heart rate. This in turn results in a temporary and dramatic drop in blood flow to the brain, leading to a loss of consciousness. Fainting is one of the body's self defense mechanisms. Passing out puts the brain in a calmed state and causes it to shut down for a while, in order to begin the healing process.    Summary: 1. Refer to this scale when providing [place] with your pain level. 2. Be  accurate and careful when reporting your pain level. This will help with your care. 3. Over-reporting your pain level will lead to loss of credibility. 4. Even a level of 1/10 means that there is pain and will be treated at our facility. 5. High, inaccurate reporting will be documented as "Symptom Exaggeration", leading to loss of credibility and suspicions of possible secondary gains such as obtaining more narcotics, or wanting to appear disabled, for fraudulent reasons. 6. Only pain levels of 5 or below will be seen at our facility. 7. Pain levels of 6 and above will be sent to the Emergency Department and the appointment cancelled. ____________________________________________________________________________________________   ____________________________________________________________________________________________  Medication Rules  Applies to: All patients receiving prescriptions (written or electronic).  Pharmacy of record: Pharmacy where electronic prescriptions will be sent. If written prescriptions are taken to a different pharmacy, please inform the nursing staff. The pharmacy listed in the electronic medical record should be the one where you would like electronic prescriptions to be sent.  Prescription refills: Only during scheduled appointments. Applies to both, written and electronic prescriptions.  NOTE: The following applies primarily to controlled substances (Opioid* Pain Medications).   Patient's responsibilities: 1. Pain Pills: Bring all pain pills to every appointment (except for procedure appointments). 2. Pill Bottles: Bring pills in original pharmacy bottle. Always bring newest bottle. Bring bottle, even if empty. 3. Medication refills: You are responsible for knowing and keeping track of what medications you need refilled. The day before your appointment, write a list of all  prescriptions that need to be refilled. Bring that list to your appointment and give it to the  admitting nurse. Prescriptions will be written only during appointments. If you forget a medication, it will not be "Called in", "Faxed", or "electronically sent". You will need to get another appointment to get these prescribed. 4. Prescription Accuracy: You are responsible for carefully inspecting your prescriptions before leaving our office. Have the discharge nurse carefully go over each prescription with you, before taking them home. Make sure that your name is accurately spelled, that your address is correct. Check the name and dose of your medication to make sure it is accurate. Check the number of pills, and the written instructions to make sure they are clear and accurate. Make sure that you are given enough medication to last until your next medication refill appointment. 5. Taking Medication: Take medication as prescribed. Never take more pills than instructed. Never take medication more frequently than prescribed. Taking less pills or less frequently is permitted and encouraged, when it comes to controlled substances (written prescriptions).  6. Inform other Doctors: Always inform, all of your healthcare providers, of all the medications you take. 7. Pain Medication from other Providers: You are not allowed to accept any additional pain medication from any other Doctor or Healthcare provider. There are two exceptions to this rule. (see below) In the event that you require additional pain medication, you are responsible for notifying us, as stated below. 8. Medication Agreement: You are responsible for carefully reading and following our Medication Agreement. This must be signed before receiving any prescriptions from our practice. Safely store a copy of your signed Agreement. Violations to the Agreement will result in no further prescriptions. (Additional copies of our Medication Agreement are available upon request.) 9. Laws, Rules, & Regulations: All patients are expected to follow all Federal  and [employer], Rules, [employer]. Ignorance of the Laws does not constitute a valid excuse. The use of any illegal substances is prohibited. 10. Adopted CDC guidelines & recommendations: Target dosing levels will be at or below 60 MME/day. Use of benzodiazepines** is not recommended.  Exceptions: There are only two exceptions to the rule of not receiving pain medications from other Healthcare Providers. 1. Exception #1 (Emergencies): In the event of an emergency (i.e.: accident requiring emergency care), you are allowed to receive additional pain medication. However, you are responsible for: As soon as you are able, call our office [phone], at any time of the day or night, and leave a message stating your name, the date and nature of the emergency, and the name and dose of the medication prescribed. In the event that your call is answered by a member of our staff, make sure to document and save the date, time, and the name of the person that took your information.  2. Exception #2 (Planned Surgery): In the event that you are scheduled by another doctor or dentist to have any type of surgery or procedure, you are allowed (for a period no longer than 30 days), to receive additional pain medication, for the acute post-op pain. However, in this case, you are responsible for picking up a copy of our "Post-op Pain Management for Surgeons" handout, and giving it to your surgeon or dentist. This document is available at our office, and does not require an appointment to obtain it. Simply go to our office during business hours (Monday-Thursday from 8:00 AM to 4:00 PM) (Friday 8:00 AM to 12:00 Noon) or if you  have a scheduled appointment with [place], prior to your surgery, and ask for it by name. In addition, you will need to provide [place] with your name, name of your surgeon, type of surgery, and date of procedure or surgery.  *Opioid medications include: morphine, codeine, oxycodone, oxymorphone,  hydrocodone, hydromorphone, meperidine, tramadol, tapentadol, buprenorphine, fentanyl, methadone. **Benzodiazepine medications include: diazepam (Valium), alprazolam (Xanax), clonazepam (Klonopine), lorazepam (Ativan), clorazepate (Tranxene), chlordiazepoxide (Librium), estazolam (Prosom), oxazepam (Serax), temazepam (Restoril), triazolam (Halcion)  ____________________________________________________________________________________________ Pain Management Discharge Instructions  General Discharge Instructions :  If you need to reach your doctor call: Monday-Friday 8:00 am - 4:00 pm at [phone] or toll free [phone].  After clinic hours [phone] to have operator reach doctor.  Bring all of your medication bottles to all your appointments in the pain clinic.  To cancel or reschedule your appointment with Pain Management please remember to call 24 hours in advance to avoid a fee.  Refer to the educational materials which you have been given on: General Risks, I had my Procedure. Discharge Instructions, Post Sedation.  Post Procedure Instructions:  The drugs you were given will stay in your system until tomorrow, so for the next 24 hours you should not drive, make any legal decisions or drink any alcoholic beverages.  You may eat anything you prefer, but it is better to start with liquids then soups and crackers, and gradually work up to solid foods.  Please notify your doctor immediately if you have any unusual bleeding, trouble breathing or pain that is not related to your normal pain.  Depending on the type of procedure that was done, some parts of your body may feel week and/or numb.  This usually clears up by tonight or the next day.  Walk with the use of an assistive device or accompanied by an adult for the 24 hours.  You may use ice on the affected area for the first 24 hours.  Put ice in a Ziploc bag and cover with a towel and place against area 15 minutes on 15  minutes off.  You may switch to heat after 24 hours.

## 2016-09-26 NOTE — Progress Notes (Signed)
Safety precautions to be maintained throughout the outpatient stay will include: orient to surroundings, keep bed in low position, maintain call bell within reach at all times, provide assistance with transfer out of bed and ambulation.  

## 2016-09-27 DIAGNOSIS — G8929 Other chronic pain: Secondary | ICD-10-CM | POA: Insufficient documentation

## 2016-09-27 DIAGNOSIS — M5441 Lumbago with sciatica, right side: Secondary | ICD-10-CM

## 2016-10-03 ENCOUNTER — Encounter (HOSPITAL_COMMUNITY): Payer: Self-pay

## 2016-10-03 ENCOUNTER — Ambulatory Visit (HOSPITAL_COMMUNITY)
Admission: RE | Admit: 2016-10-03 | Discharge: 2016-10-03 | Disposition: A | Discharge status: Home / Self Care | Payer: BLUE CROSS/BLUE SHIELD | Source: Ambulatory Visit | Attending: Pain Medicine | Admitting: Pain Medicine

## 2016-10-03 DIAGNOSIS — G8929 Other chronic pain: Secondary | ICD-10-CM

## 2016-10-03 DIAGNOSIS — E785 Hyperlipidemia, unspecified: Secondary | ICD-10-CM

## 2016-10-03 DIAGNOSIS — M79605 Pain in left leg: Principal | ICD-10-CM

## 2016-10-03 DIAGNOSIS — E1169 Type 2 diabetes mellitus with other specified complication: Secondary | ICD-10-CM | POA: Insufficient documentation

## 2016-10-03 DIAGNOSIS — M79604 Pain in right leg: Principal | ICD-10-CM

## 2016-10-03 DIAGNOSIS — E1142 Type 2 diabetes mellitus with diabetic polyneuropathy: Secondary | ICD-10-CM | POA: Insufficient documentation

## 2016-10-03 DIAGNOSIS — F411 Generalized anxiety disorder: Secondary | ICD-10-CM | POA: Insufficient documentation

## 2016-10-03 DIAGNOSIS — E782 Mixed hyperlipidemia: Secondary | ICD-10-CM | POA: Insufficient documentation

## 2016-10-03 DIAGNOSIS — M5416 Radiculopathy, lumbar region: Secondary | ICD-10-CM

## 2016-10-05 ENCOUNTER — Other Ambulatory Visit: Payer: Self-pay | Admitting: Pain Medicine

## 2016-10-05 DIAGNOSIS — M5416 Radiculopathy, lumbar region: Principal | ICD-10-CM

## 2016-10-05 DIAGNOSIS — Z0289 Encounter for other administrative examinations: Secondary | ICD-10-CM | POA: Insufficient documentation

## 2016-10-05 DIAGNOSIS — G8929 Other chronic pain: Secondary | ICD-10-CM

## 2016-10-15 ENCOUNTER — Other Ambulatory Visit: Payer: Self-pay | Admitting: Pain Medicine

## 2016-10-15 ENCOUNTER — Ambulatory Visit
Admission: RE | Admit: 2016-10-15 | Discharge: 2016-10-15 | Disposition: A | Discharge status: Home / Self Care | Payer: BLUE CROSS/BLUE SHIELD | Source: Ambulatory Visit | Attending: Pain Medicine | Admitting: Pain Medicine

## 2016-10-15 DIAGNOSIS — M5416 Radiculopathy, lumbar region: Principal | ICD-10-CM

## 2016-10-15 DIAGNOSIS — G8929 Other chronic pain: Secondary | ICD-10-CM

## 2016-10-16 ENCOUNTER — Telehealth: Payer: Self-pay | Admitting: Pain Medicine

## 2016-10-16 ENCOUNTER — Other Ambulatory Visit: Payer: Self-pay | Admitting: Nurse Practitioner

## 2016-10-16 DIAGNOSIS — G8929 Other chronic pain: Secondary | ICD-10-CM

## 2016-10-16 DIAGNOSIS — M5441 Lumbago with sciatica, right side: Principal | ICD-10-CM

## 2016-10-16 MED ORDER — DIAZEPAM 5 MG PO TABS: 5.0000 mg | ORAL_TABLET | ORAL | 0 refills | Status: DC | PRN

## 2016-10-16 NOTE — Telephone Encounter (Signed)
Diazepam 5 mg #2 Take 1 tablet 45 minutes prior to MRI appointment take next tablet just prior to going into MRI scanner O refills

## 2016-10-16 NOTE — Telephone Encounter (Signed)
Could not complete MRI, had panic attack, it has been rescheduled but she will need something to help her thru. Can we call in med so she can have MRI on Sunday?

## 2016-10-16 NOTE — Telephone Encounter (Signed)
Please advise 

## 2016-10-16 NOTE — Telephone Encounter (Signed)
Patient notified per voicemail on both phone numbers that medication has been called in. Instructed she may not drive.

## 2016-10-21 ENCOUNTER — Ambulatory Visit
Admission: RE | Admit: 2016-10-21 | Discharge: 2016-10-21 | Disposition: A | Discharge status: Home / Self Care | Payer: BLUE CROSS/BLUE SHIELD | Source: Ambulatory Visit | Attending: Pain Medicine | Admitting: Pain Medicine

## 2016-10-21 DIAGNOSIS — G8929 Other chronic pain: Secondary | ICD-10-CM

## 2016-10-21 DIAGNOSIS — M5416 Radiculopathy, lumbar region: Principal | ICD-10-CM

## 2016-10-22 ENCOUNTER — Ambulatory Visit: Payer: BLUE CROSS/BLUE SHIELD | Admitting: Pain Medicine

## 2016-10-23 NOTE — Progress Notes (Signed)
Patient's Name: Lorraine Hutchinson  MRN: [id number]  Referring Provider: Wayland Denis, PA-C  DOB: 04/19/1960  PCP: Wayland Denis, PA-C  DOS: 10/24/2016  Note by: Gaspar Cola, MD  Service setting: Ambulatory outpatient  Specialty: Interventional Pain Management  Location: [facility] (AMB) Pain Management Facility    Patient type: Established   Primary Reason(s) for Visit: Encounter for prescription drug management. (Level of risk: moderate)  CC: Back Pain (lower) and Shoulder Pain (both)  HPI  Lorraine Hutchinson is a 56 y.o. year old, female patient, who comes today for a medication management evaluation. She has Chronic depression; COPD, moderate ([place]); Essential hypertension; Former cigarette smoker; History of type 2 diabetes mellitus; Obesity, morbid, BMI 50 or higher ([place]); OSA (obstructive sleep apnea); Vitamin D deficiency, unspecified; Long term current use of opiate analgesic; Long term prescription opiate use; Opiate use; Chronic sacroiliac joint pain (Bilateral) (R>L); Chronic neck pain (Tertiary Area of Pain)( (R>L); Encounter for screening for nutritional disorder; Chronic knee pain (Right); Shoulder impingement syndrome (Right); Lumbar facet osteoarthritis (Bilateral); Lumbar facet syndrome (Bilateral) (R>L); Lumbar facet arthralgia (Bilateral) (R>L); Lumbar facet arthropathy (Bilateral); DDD (degenerative disc disease), lumbar (L2-3); Spondylosis of lumbar spine; Chronic pain of lower extremity (Secondary Area of Pain) (R>L); Chronic shoulder pain (Fourth Area of Pain) (R>L); Elevated C-reactive protein (CRP); Hypomagnesemia; Chronic pain syndrome; Chronic lumbar radicular pain (Right) (L4 Dermatome); Chronic low back pain (Primary Area of Pain) (Bilateral) (R>L); Generalized anxiety disorder; Hyperlipidemia, mixed; Type 2 diabetes mellitus with peripheral neuropathy ([place]); Pain medication agreement signed; Abnormal MRI, lumbar spine (10/21/2016); and 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 Her primarily concern today is the Back Pain (lower) and Shoulder Pain (both)  Pain Assessment: Location: Lower Back Radiating: down both legs, back of the leg crosses over to the front of the leg at the knee,(more on the right than left leg) Onset: More than a month ago Duration: Chronic pain Quality: Aching, Constant, Radiating, Shooting, Spasm, Dull Severity: 6 /10 (self-reported pain score)  Note: Reported level is inconsistent with clinical observations. Clinically the patient looks like a 4/10 Information on the proper use of the pain scale provided to the patient today. When using our objective Pain Scale, levels between 6 and 10/10 are said to belong in an emergency room, as it progressively worsens from a 6/10, described as severely limiting, requiring emergency care not usually available at an outpatient pain management facility. At a 6/10 level, communication becomes difficult and requires great effort. Assistance to reach the emergency department may be required. Facial flushing and profuse sweating along with potentially dangerous increases in heart rate and blood pressure will be evident. Effect on ADL: pace self Timing: Constant Modifying factors: nothing helps  Lorraine Hutchinson was last scheduled for an appointment on 10/16/2016 for medication management. During today's appointment we reviewed Lorraine Hutchinson's chronic pain status, as well as her outpatient medication regimen.  The patient  has no drug history on file. Her body mass index is 59.07 kg/m.  Further details on both, my assessment(s), as well as the proposed treatment plan, please see below.  Controlled Substance Pharmacotherapy Assessment REMS (Risk Evaluation and Mitigation Strategy)  Analgesic: Oxycodone IR 5 mg 1 tablet by mouth every 8 hours (15 mg/day of oxycodone) (22.5 MME/day) MME/day: 22.5 mg/day.  Lona Millard, RN  10/24/2016 11:57 AM  Sign at close encounter Nursing  Pain Medication Assessment:  Safety precautions to be maintained throughout the outpatient stay will include: orient to  surroundings, keep bed in low position, maintain call bell within reach at all times, provide assistance with transfer out of bed and ambulation.  Medication Inspection Compliance: Pill count conducted under aseptic conditions, in front of the patient. Neither the pills nor the bottle was removed from the patient's sight at any time. Once count was completed pills were immediately returned to the patient in their original bottle.  Medication: Oxycodone IR Pill/Patch Count: 16 of 90 pills remain Pill/Patch Appearance: Markings consistent with prescribed medication Bottle Appearance: Standard pharmacy container. Clearly labeled. Filled Date: 09 / 12 / 2018 Last Medication intake:  Today   Pharmacokinetics: Liberation and absorption (onset of action): WNL Distribution (time to peak effect): WNL Metabolism and excretion (duration of action): WNL         Pharmacodynamics: Desired effects: Analgesia: Lorraine Hutchinson reports >50% benefit. Functional ability: Patient reports that medication allows her to accomplish basic ADLs Clinically meaningful improvement in function (CMIF): Sustained CMIF goals met Perceived effectiveness: Described as relatively effective, allowing for increase in activities of daily living (ADL) Undesirable effects: Side-effects or Adverse reactions: None reported Monitoring: [place] PMP: Online review of the past 34-monthperiod conducted. Compliant with practice rules and regulations Last UDS on record: Summary  Date Value Ref Range Status  09/18/2016 FINAL  Final    Comment:    ==================================================================== [facility]RE COMP DRUG ANALYSIS,UR ==================================================================== Test                             Result       Flag       Units Drug Present and Declared for Prescription  Verification   Duloxetine                     PRESENT      EXPECTED   Trazodone                      PRESENT      EXPECTED   1,3 chlorophenyl piperazine    PRESENT      EXPECTED    1,3-chlorophenyl piperazine is an expected metabolite of    trazodone.   Dextromethorphan               PRESENT      EXPECTED   Dextrorphan/Levorphanol        PRESENT      EXPECTED    Dextrorphan is an expected metabolite of dextromethorphan, an    over-the-counter or prescription cough suppressant. Dextrorphan    cannot be distinguished from the scheduled prescription    medication levorphanol by the method used for analysis.   Diltiazem                      PRESENT      EXPECTED   Guaifenesin                    PRESENT      EXPECTED    Guaifenesin may be administered as an over-the-counter or    prescription drug; it may also be present as a breakdown product    of methocarbamol. Drug Present not Declared for Prescription Verification   Gabapentin                     PRESENT      UNEXPECTED   Citalopram  PRESENT      UNEXPECTED   Desmethylcitalopram            PRESENT      UNEXPECTED    Desmethylcitalopram is an expected metabolite of citalopram or    the enantiomeric form, escitalopram.   Acetaminophen                  PRESENT      UNEXPECTED Drug Absent but Declared for Prescription Verification   Diclofenac                     Not Detected UNEXPECTED    Diclofenac, as indicated in the declared medication list, is not    always detected even when used as directed. ==================================================================== Test                      Result    Flag   Units      Ref Range   Creatinine              174              mg/dL      >=20 ==================================================================== Declared Medications:  The flagging and interpretation on this report are based on the  following declared medications.  Unexpected results may arise from   inaccuracies in the declared medications.  **Note: The testing scope of this panel includes these medications:  Dextromethorphan  Diltiazem  Duloxetine  Guaifenesin  Trazodone  **Note: The testing scope of this panel does not include small to  moderate amounts of these reported medications:  Diclofenac  **Note: The testing scope of this panel does not include following  reported medications:  Calcium (Calcium/Vitamin D)  Cholecalciferol  Chromium  Cinnamon Bark  Folic acid  Hydrochlorothiazide (Lisinopril-HCTZ)  Insulin (NovoLog)  Lisinopril (Lisinopril-HCTZ)  Lutein  Melatonin  Metformin  Multivitamin  Omeprazole  Potassium  Pravastatin  Supplement (Lipoic Acid)  Vitamin B  Vitamin B (Biotin)  Vitamin D (Calcium/Vitamin D) ==================================================================== For clinical consultation, please call [phone]. ====================================================================    UDS interpretation: Compliant          Medication Assessment Form: Reviewed. Patient indicates being compliant with therapy Treatment compliance: Compliant Risk Assessment Profile: Aberrant behavior: See prior evaluations. None observed or detected today Comorbid factors increasing risk of overdose: See prior notes. No additional risks detected today Risk of substance use disorder (SUD): Low     Opioid Risk Tool - 10/24/16 1023      Family History of Substance Abuse   Alcohol Negative   Illegal Drugs Negative   Rx Drugs Negative     Personal History of Substance Abuse   Alcohol Negative   Illegal Drugs Negative   Rx Drugs Negative     Age   Age between 57-45 years  No     History of Preadolescent Sexual Abuse   History of Preadolescent Sexual Abuse Negative or Female     Psychological Disease   Psychological Disease Negative   ADD Negative   OCD Negative   Bipolar Negative   Schizophrenia Negative   Depression Positive     Total Score    Opioid Risk Tool Scoring 1   Opioid Risk Interpretation Low Risk     ORT Scoring interpretation table:  Score <3 = Low Risk for SUD  Score between 4-7 = Moderate Risk for SUD  Score >8 = High Risk for Opioid Abuse   Risk Mitigation Strategies:  Patient  Counseling: Covered Patient-Prescriber Agreement (PPA): Present and active  Notification to other healthcare providers: Done  Pharmacologic Plan: No change in therapy, at this time  Laboratory Chemistry  Inflammation Markers (CRP: Acute Phase) (ESR: Chronic Phase) Lab Results  Component Value Date   CRP 7.2 (H) 09/18/2016   ESRSEDRATE 27 09/18/2016                 Renal Function Markers Lab Results  Component Value Date   BUN 19 09/18/2016   CREATININE 1.00 09/18/2016   GFRAA 72 09/18/2016   GFRNONAA 62 09/18/2016                 Hepatic Function Markers Lab Results  Component Value Date   AST 17 09/18/2016   ALT 18 09/18/2016   ALBUMIN 4.3 09/18/2016   ALKPHOS 33 (L) 09/18/2016                 Electrolytes Lab Results  Component Value Date   NA 145 (H) 09/18/2016   K 4.9 09/18/2016   CL 104 09/18/2016   CALCIUM 9.7 09/18/2016   MG 1.4 (L) 09/18/2016                 Neuropathy Markers Lab Results  Component Value Date   VITAMINB12 1,105 09/18/2016                 Bone Pathology Markers Lab Results  Component Value Date   ALKPHOS 33 (L) 09/18/2016   25OHVITD1 33 09/18/2016   25OHVITD2 <1.0 09/18/2016   25OHVITD3 33 09/18/2016   CALCIUM 9.7 09/18/2016                 Coagulation Parameters No results found for: INR, LABPROT, APTT, PLT               Cardiovascular Markers No results found for: BNP, HGB, HCT               Note: Lab results reviewed.  Recent Diagnostic Imaging Results  MR LUMBAR SPINE WO CONTRAST CLINICAL DATA:  Low back pain radiating into the right leg.  EXAM: MRI LUMBAR SPINE WITHOUT CONTRAST  TECHNIQUE: Multiplanar, multisequence MR imaging of the lumbar spine  was performed. No intravenous contrast was administered.  COMPARISON:  MRI lumbar spine dated July 25, 2006.  FINDINGS: Segmentation:  Standard.  Alignment: Unchanged levoscoliosis centered at L2-L3. Sagittal alignment is maintained.  Vertebrae: No fracture, evidence of discitis, or bone lesion. Unchanged hemangiomas in the L1 and L3 vertebral bodies.  Conus medullaris: Extends to the L1 level and appears normal.  Paraspinal and other soft tissues: Negative.  Disc levels:  T11-T12:  Only seen on the sagittal images.  Negative.  T12-L1: Small central disc protrusion without spinal canal or neuroforaminal stenosis.  L1-L2: Right foraminal disc osteophyte complex and mild left facet arthropathy, slightly progressed when compared to prior study. No spinal canal or neuroforaminal stenosis.  L2-L3: Small right foraminal disc osteophyte complex and mild to moderate bilateral facet arthropathy. Prominent right ligamentum flavum hypertrophy. Mild right lateral recess narrowing and neuroforaminal stenosis, worsened when compared to prior study. No spinal canal or left neuroforaminal stenosis.  L3-L4: Small diffuse disc bulge and mild bilateral facet arthropathy. Mild right neuroforaminal stenosis, new when compared to prior study. No significant central spinal canal or left neuroforaminal stenosis.  L4-L5: Small diffuse disc bulge and mild bilateral facet arthropathy without significant spinal canal or neuroforaminal stenosis.  L5-S1: Mild bilateral facet arthropathy. No spinal  canal or neuroforaminal stenosis.  IMPRESSION: 1. Mild multilevel degenerative changes of the lumbar spine, worst at L2-L3, progressed when compared to prior study. Mild right neuroforaminal stenosis at L2-L3 and L3-L4. No definite evidence of neural impingement.  Electronically Signed   By: Titus Dubin M.D.   On: 10/21/2016 12:55  Complexity Note: Imaging results reviewed. Results shared with  Ms. Dalporto, using [employer].                         Meds   Current Outpatient Prescriptions:  .  Alpha-Lipoic Acid 100 MG CAPS, Take by mouth daily. , Disp: , Rfl:  .  B Complex-Biotin-FA (TH VITAMIN B 50/B-COMPLEX) TABS, Take 1 tablet by mouth daily., Disp: , Rfl:  .  B-D UF III MINI PEN NEEDLES 31G X 5 MM MISC, U UTD 2 XD, Disp: , Rfl: 3 .  busPIRone (BUSPAR) 7.5 MG tablet, Take 7.5 mg by mouth 2 (two) times daily. , Disp: , Rfl:  .  Calcium-Vitamin D 600-200 MG-UNIT tablet, Take 1 tablet by mouth 2 (two) times daily., Disp: , Rfl:  .  Cinnamon 500 MG capsule, Take 2,000 mg by mouth daily. , Disp: , Rfl:  .  Dextromethorphan-Guaifenesin 60-1200 MG 12hr tablet, Take 1 tablet by mouth as needed. , Disp: , Rfl:  .  diclofenac (VOLTAREN) 75 MG EC tablet, Take 75 mg by mouth 2 (two) times daily., Disp: , Rfl:  .  diltiazem (DILACOR XR) 180 MG 24 hr capsule, Take 180 mg by mouth 2 (two) times daily., Disp: , Rfl:  .  DULoxetine (CYMBALTA) 30 MG capsule, Take 30 mg by mouth 2 (two) times daily., Disp: , Rfl:  .  insulin aspart protamine - aspart (NOVOLOG MIX 70/30 FLEXPEN) (70-30) 100 UNIT/ML FlexPen, Inject 60 Units into the skin daily before breakfast. 62 units before supper, Disp: , Rfl:  .  lisinopril-hydrochlorothiazide (PRINZIDE,ZESTORETIC) 20-25 MG tablet, Take 1 tablet by mouth daily., Disp: , Rfl:  .  Magnesium Oxide 500 MG CAPS, Take 1 capsule (500 mg total) by mouth 2 (two) times daily at 8 am and 10 pm., Disp: 90 capsule, Rfl: 0 .  Melatonin 10 MG TABS, Take by mouth every evening., Disp: , Rfl:  .  metFORMIN (GLUCOPHAGE-XR) 500 MG 24 hr tablet, TAKE 3 TABLETS BY MOUTH DAILY WITH DINNER, Disp: , Rfl:  .  Multiple Vitamins-Minerals (ABC PLUS SENIOR ADULTS 50+ PO), Take by mouth., Disp: , Rfl:  .  omeprazole (PRILOSEC) 20 MG capsule, Take 20 mg by mouth daily., Disp: , Rfl:  .  [START ON 10/26/2016] oxyCODONE (OXY IR/ROXICODONE) 5 MG immediate release tablet, Take 1 tablet (5 mg  total) by mouth every 8 (eight) hours as needed for severe pain., Disp: 90 tablet, Rfl: 0 .  Potassium 99 MG TABS, Take 1 tablet by mouth daily., Disp: , Rfl:  .  pravastatin (PRAVACHOL) 40 MG tablet, Take 40 mg by mouth daily., Disp: , Rfl:  .  traZODone (DESYREL) 50 MG tablet, Take 50 mg by mouth daily., Disp: , Rfl:  .  metFORMIN (GLUCOPHAGE) 1000 MG tablet, Take 1,000 mg by mouth 2 (two) times daily., Disp: , Rfl:   ROS  Constitutional: Denies any fever or chills Gastrointestinal: No reported hemesis, hematochezia, vomiting, or acute GI distress Musculoskeletal: Denies any acute onset joint swelling, redness, loss of ROM, or weakness Neurological: No reported episodes of acute onset apraxia, aphasia, dysarthria, agnosia, amnesia, paralysis, loss of coordination, or  loss of consciousness  Allergies  Ms. Casseus is allergic to codeine.  [place]  Drug: Ms. Vegh  has no drug history on file. Alcohol:  reports that she does not drink alcohol. Tobacco:  reports that she quit smoking about 21 months ago. Her smoking use included Cigarettes. She has a 40.00 pack-year smoking history. She has never used smokeless tobacco. Medical:  has a past medical history of Cancer ([place]); Diabetes mellitus without complication ([place]); Hyperlipidemia; Hypertension; Scoliosis; and Sleep apnea. Surgical: Ms. Bonenfant  has a past surgical history that includes Replacement total knee (Right); Appendectomy; Cesarean section; Abdominal hysterectomy; and Tonsillectomy. Family: family history includes Cancer in her mother; Diabetes in her father and mother.  Constitutional Exam  General appearance: Well nourished, well developed, and well hydrated. In no apparent acute distress Vitals:   10/24/16 1008  BP: 121/72  Pulse: (!) 107  Resp: 16  Temp: 98.4 F (36.9 C)  SpO2: 96%  Weight: (!) 400 lb (181.4 kg)  Height: _0  (1.753 m)   BMI Assessment: Estimated body mass index is 59.07 kg/m as calculated from  the following:   Height as of this encounter: _1  (1.753 m).   Weight as of this encounter: 400 lb (181.4 kg).  BMI interpretation table: BMI level Category Range association with higher incidence of chronic pain  <18 kg/m2 Underweight   18.5-24.9 kg/m2 Ideal body weight   25-29.9 kg/m2 Overweight Increased incidence by 20%  30-34.9 kg/m2 Obese (Class I) Increased incidence by 68%  35-39.9 kg/m2 Severe obesity (Class II) Increased incidence by 136%  >40 kg/m2 Extreme obesity (Class III) Increased incidence by 254%   BMI Readings from Last 4 Encounters:  10/24/16 59.07 kg/m  09/26/16 56.71 kg/m  09/18/16 58.77 kg/m  07/11/15 55.72 kg/m   Wt Readings from Last 4 Encounters:  10/24/16 (!) 400 lb (181.4 kg)  09/26/16 (!) 384 lb (174.2 kg)  09/18/16 (!) 398 lb (180.5 kg)  07/11/15 (!) 377 lb 4.8 oz (171.1 kg)  Psych/Mental status: Alert, oriented x 3 (person, place, & time)       Eyes: PERLA Respiratory: No evidence of acute respiratory distress  Cervical Spine Area Exam  Skin & Axial Inspection: No masses, redness, edema, swelling, or associated skin lesions Alignment: Symmetrical Functional ROM: Unrestricted ROM      Stability: No instability detected Muscle Tone/Strength: Functionally intact. No obvious neuro-muscular anomalies detected. Sensory (Neurological): Unimpaired Palpation: No palpable anomalies              Upper Extremity (UE) Exam    Side: Right upper extremity  Side: Left upper extremity  Skin & Extremity Inspection: Skin color, temperature, and hair growth are WNL. No peripheral edema or cyanosis. No masses, redness, swelling, asymmetry, or associated skin lesions. No contractures.  Skin & Extremity Inspection: Skin color, temperature, and hair growth are WNL. No peripheral edema or cyanosis. No masses, redness, swelling, asymmetry, or associated skin lesions. No contractures.  Functional ROM: Unrestricted ROM          Functional ROM: Unrestricted ROM           Muscle Tone/Strength: Functionally intact. No obvious neuro-muscular anomalies detected.  Muscle Tone/Strength: Functionally intact. No obvious neuro-muscular anomalies detected.  Sensory (Neurological): Unimpaired          Sensory (Neurological): Unimpaired          Palpation: No palpable anomalies              Palpation: No palpable anomalies  Specialized Test(s): Deferred         Specialized Test(s): Deferred          Thoracic Spine Area Exam  Skin & Axial Inspection: No masses, redness, or swelling Alignment: Symmetrical Functional ROM: Unrestricted ROM Stability: No instability detected Muscle Tone/Strength: Functionally intact. No obvious neuro-muscular anomalies detected. Sensory (Neurological): Unimpaired Muscle strength & Tone: No palpable anomalies  Lumbar Spine Area Exam  Skin & Axial Inspection: No masses, redness, or swelling Alignment: Symmetrical Functional ROM: Unrestricted ROM      Stability: No instability detected Muscle Tone/Strength: Functionally intact. No obvious neuro-muscular anomalies detected. Sensory (Neurological): Unimpaired Palpation: No palpable anomalies       Provocative Tests: Lumbar Hyperextension and rotation test: evaluation deferred today       Lumbar Lateral bending test: evaluation deferred today       Patrick's Maneuver: evaluation deferred today                    Gait & Posture Assessment  Ambulation: Unassisted Gait: Relatively normal for age and body habitus Posture: WNL   Lower Extremity Exam    Side: Right lower extremity  Side: Left lower extremity  Skin & Extremity Inspection: Skin color, temperature, and hair growth are WNL. No peripheral edema or cyanosis. No masses, redness, swelling, asymmetry, or associated skin lesions. No contractures.  Skin & Extremity Inspection: Skin color, temperature, and hair growth are WNL. No peripheral edema or cyanosis. No masses, redness, swelling, asymmetry, or associated skin  lesions. No contractures.  Functional ROM: Unrestricted ROM          Functional ROM: Unrestricted ROM          Muscle Tone/Strength: Functionally intact. No obvious neuro-muscular anomalies detected.  Muscle Tone/Strength: Functionally intact. No obvious neuro-muscular anomalies detected.  Sensory (Neurological): Unimpaired  Sensory (Neurological): Unimpaired  Palpation: No palpable anomalies  Palpation: No palpable anomalies   Assessment  Primary Diagnosis & Pertinent Problem List: The primary encounter diagnosis was Chronic low back pain (Primary Area of Pain) (Bilateral) (R>L). Diagnoses of Lumbar facet syndrome (Bilateral) (R>L), Chronic sacroiliac joint pain (Bilateral) (R>L), Lumbar facet arthropathy (Bilateral), Spondylosis of lumbar spine, Long term current use of opiate analgesic, Long term prescription opiate use, Opiate use, and Chronic pain syndrome were also pertinent to this visit.  Status Diagnosis  Controlled Controlled Controlled 1. Chronic low back pain (Primary Area of Pain) (Bilateral) (R>L)   2. Lumbar facet syndrome (Bilateral) (R>L)   3. Chronic sacroiliac joint pain (Bilateral) (R>L)   4. Lumbar facet arthropathy (Bilateral)   5. Spondylosis of lumbar spine   6. Long term current use of opiate analgesic   7. Long term prescription opiate use   8. Opiate use   9. Chronic pain syndrome     Problems updated and reviewed during this visit: Problem  Abnormal MRI, lumbar spine (10/21/2016)   T12-L1: Small central disc protrusion without spinal canal or neuroforaminal stenosis. L1-2: Right foraminal disc osteophyte complex and mild left facet arthropathy, slightly progressed when compared to prior study. No spinal canal or neuroforaminal stenosis. L2-3: Small right foraminal disc osteophyte complex and mild to moderate bilateral facet arthropathy. Prominent right ligamentum flavum hypertrophy. Mild right lateral recess narrowing and neuroforaminal stenosis, worsened when  compared to prior study. No spinal canal or left neuroforaminal stenosis. L3-4: Small diffuse disc bulge and mild bilateral facet arthropathy. Mild right neuroforaminal stenosis, new when compared to prior study. No significant central spinal canal or left neuroforaminal  stenosis. L4-5: Small diffuse disc bulge and mild bilateral facet arthropathy without significant spinal canal or neuroforaminal stenosis. L5-S1: Mild bilateral facet arthropathy. No spinal canal or neuroforaminal stenosis.  IMPRESSION: 1. Mild multilevel degenerative changes of the lumbar spine, worst at L2-L3, progressed when compared to prior study. Mild right neuroforaminal stenosis at L2-L3 and L3-L4. No definite evidence of neural impingement.   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 oxyCODONE (OXY IR/ROXICODONE) 5 MG immediate release tablet    Sig: Take 1 tablet (5 mg total) by mouth every 8 (eight) hours as needed for severe pain.    Dispense:  90 tablet    Refill:  0    Do not place this medication, or any other prescription from our practice, on "Automatic Refill". Patient may have prescription filled one day early if pharmacy is closed on scheduled refill date. Do not fill until: 10/26/16 To last until: 11/25/16   New Prescriptions   No medications on file   Medications administered today: Ms. Tedesco had no medications administered during this visit.   Procedure Orders     LUMBAR FACET(MEDIAL BRANCH NERVE BLOCK)  MBNB     SACROILIAC JOINT INJECTINS  Lab Orders     ToxASSURE Select 13 (MW), Urine Imaging Orders  No imaging studies ordered today   Referral Orders  No referral(s) requested today    Interventional management options: Planned, scheduled, and/or pending:   Diagnostic right-sided lumbar facet    Considering:   Diagnostic right-sided lumbar facet block  Possible right sided lumbar facet RFA  Diagnostic right-sided sacroiliac joint block  Possible right-sided sacroiliac joint RFA  Diagnostic right L2-L3 interlaminar lumbar epidural steroid injection Diagnostic right-sided L2-3 and L3-4 transforaminal epidural steroid injection Diagnostic right-sided intra-articular knee joint injection  Possible series of 5 right-sided intra-articular Hyalgan knee injections  Diagnostic right-sided genicular nerve block  Possible right-sided genicular nerve RFA  Diagnostic right-sided cervical epidural steroid injection  Diagnostic bilateral cervical facet block  Possible bilateral cervical facet RFA  Diagnostic right-sided intra-articular shoulder joint injection  Diagnostic right-sided suprascapular nerve block  Possible right-sided suprascapular nerve RFA    Palliative PRN treatment(s):   None at this time   Provider-requested follow-up: Return for Procedure (w/ sedation): (B) L-FCT Blk + (B) S-I Blk.  No future appointments. Primary Care Physician: Wayland Denis, PA-C Location: [facility] Note by: Gaspar Cola, MD Date: 10/24/2016; Time: 1:08 PM

## 2016-10-24 ENCOUNTER — Encounter: Payer: Self-pay | Admitting: Pain Medicine

## 2016-10-24 ENCOUNTER — Ambulatory Visit: Payer: BLUE CROSS/BLUE SHIELD | Attending: Pain Medicine | Admitting: Pain Medicine

## 2016-10-24 VITALS — BP 121/72 | HR 107 | Temp 98.4°F | Resp 16 | Ht 69.0 in | Wt >= 6400 oz

## 2016-10-24 DIAGNOSIS — I10 Essential (primary) hypertension: Secondary | ICD-10-CM | POA: Insufficient documentation

## 2016-10-24 DIAGNOSIS — G4733 Obstructive sleep apnea (adult) (pediatric): Secondary | ICD-10-CM | POA: Insufficient documentation

## 2016-10-24 DIAGNOSIS — E1142 Type 2 diabetes mellitus with diabetic polyneuropathy: Secondary | ICD-10-CM | POA: Diagnosis not present

## 2016-10-24 DIAGNOSIS — F1190 Opioid use, unspecified, uncomplicated: Secondary | ICD-10-CM

## 2016-10-24 DIAGNOSIS — Z79891 Long term (current) use of opiate analgesic: Secondary | ICD-10-CM | POA: Insufficient documentation

## 2016-10-24 DIAGNOSIS — G8929 Other chronic pain: Secondary | ICD-10-CM

## 2016-10-24 DIAGNOSIS — E782 Mixed hyperlipidemia: Secondary | ICD-10-CM | POA: Insufficient documentation

## 2016-10-24 DIAGNOSIS — J449 Chronic obstructive pulmonary disease, unspecified: Secondary | ICD-10-CM | POA: Diagnosis not present

## 2016-10-24 DIAGNOSIS — Z885 Allergy status to narcotic agent status: Secondary | ICD-10-CM | POA: Diagnosis not present

## 2016-10-24 DIAGNOSIS — G894 Chronic pain syndrome: Secondary | ICD-10-CM | POA: Diagnosis not present

## 2016-10-24 DIAGNOSIS — M5116 Intervertebral disc disorders with radiculopathy, lumbar region: Secondary | ICD-10-CM | POA: Diagnosis not present

## 2016-10-24 DIAGNOSIS — Z87891 Personal history of nicotine dependence: Secondary | ICD-10-CM | POA: Insufficient documentation

## 2016-10-24 DIAGNOSIS — M7541 Impingement syndrome of right shoulder: Secondary | ICD-10-CM | POA: Insufficient documentation

## 2016-10-24 DIAGNOSIS — Z6841 Body Mass Index (BMI) 40.0 and over, adult: Secondary | ICD-10-CM | POA: Diagnosis not present

## 2016-10-24 DIAGNOSIS — M47816 Spondylosis without myelopathy or radiculopathy, lumbar region: Secondary | ICD-10-CM | POA: Diagnosis not present

## 2016-10-24 DIAGNOSIS — M533 Sacrococcygeal disorders, not elsewhere classified: Secondary | ICD-10-CM | POA: Diagnosis not present

## 2016-10-24 DIAGNOSIS — Z5181 Encounter for therapeutic drug level monitoring: Secondary | ICD-10-CM | POA: Insufficient documentation

## 2016-10-24 DIAGNOSIS — M4726 Other spondylosis with radiculopathy, lumbar region: Secondary | ICD-10-CM | POA: Diagnosis not present

## 2016-10-24 DIAGNOSIS — M48061 Spinal stenosis, lumbar region without neurogenic claudication: Secondary | ICD-10-CM | POA: Diagnosis not present

## 2016-10-24 DIAGNOSIS — M25561 Pain in right knee: Secondary | ICD-10-CM | POA: Insufficient documentation

## 2016-10-24 DIAGNOSIS — R937 Abnormal findings on diagnostic imaging of other parts of musculoskeletal system: Secondary | ICD-10-CM | POA: Insufficient documentation

## 2016-10-24 DIAGNOSIS — F411 Generalized anxiety disorder: Secondary | ICD-10-CM | POA: Diagnosis not present

## 2016-10-24 DIAGNOSIS — Z794 Long term (current) use of insulin: Secondary | ICD-10-CM | POA: Insufficient documentation

## 2016-10-24 DIAGNOSIS — Z79899 Other long term (current) drug therapy: Secondary | ICD-10-CM | POA: Diagnosis not present

## 2016-10-24 DIAGNOSIS — M5441 Lumbago with sciatica, right side: Secondary | ICD-10-CM

## 2016-10-24 DIAGNOSIS — E559 Vitamin D deficiency, unspecified: Secondary | ICD-10-CM | POA: Diagnosis not present

## 2016-10-24 DIAGNOSIS — M1288 Other specific arthropathies, not elsewhere classified, other specified site: Secondary | ICD-10-CM | POA: Diagnosis not present

## 2016-10-24 MED ORDER — OXYCODONE HCL 5 MG PO TABS: 5.0000 mg | ORAL_TABLET | Freq: Three times a day (TID) | ORAL | 0 refills | Status: DC | PRN

## 2016-10-24 NOTE — Progress Notes (Signed)
Nursing Pain Medication Assessment:  Safety precautions to be maintained throughout the outpatient stay will include: orient to surroundings, keep bed in low position, maintain call bell within reach at all times, provide assistance with transfer out of bed and ambulation.  Medication Inspection Compliance: Pill count conducted under aseptic conditions, in front of the patient. Neither the pills nor the bottle was removed from the patient's sight at any time. Once count was completed pills were immediately returned to the patient in their original bottle.  Medication: Oxycodone IR Pill/Patch Count: 16 of 90 pills remain Pill/Patch Appearance: Markings consistent with prescribed medication Bottle Appearance: Standard pharmacy container. Clearly labeled. Filled Date: 09 / 12 / 2018 Last Medication intake:  Today

## 2016-10-24 NOTE — Progress Notes (Deleted)
Nursing Pain Medication Assessment:  Safety precautions to be maintained throughout the outpatient stay will include: orient to surroundings, keep bed in low position, maintain call bell within reach at all times, provide assistance with transfer out of bed and ambulation.  Medication Inspection Compliance: Pill count conducted under aseptic conditions, in front of the patient. Neither the pills nor the bottle was removed from the patient's sight at any time. Once count was completed pills were immediately returned to the patient in their original bottle.  Medication #1: Oxycodone IR Pill/Patch Count: 16 of 90 pills remain Pill/Patch Appearance: Markings consistent with prescribed medication Bottle Appearance: Standard pharmacy container. Clearly labeled. Filled Date: 09 / 12 / 2018 Last Medication intake:  Today  Medication #2: {Blank single:19197::"Multiple medications","Buprenorphine (Suboxone)","Butorphanol (Stadol)","Duragesic patch","Fentanyl patch","Hydrocodone/APAP","Hydromorphone (Dilaudid)","Methadone","Morphine ER (MSContin)","Morphine IR","Oxycodone ER (OxyContin)","Oxycodone IR","Oxycodone/APAP","Oxymorphone (Opana)","Tapentadol (Nucynta)","Tramadol (Ultram)","***"} Pill/Patch Count: {Blank single:19197::"No pills available to be counted.","*** of *** patches remain","*** of *** pills remain"} Pill/Patch Appearance: {Blank single:19197::"No markings","Markings inconsistent with prescribed medication","Markings consistent with prescribed medication"} Bottle Appearance: {Blank single:19197::"No label. Patient informed that medications must be transported in properly and accurately labeled containers.","Non-pharmacy container. Patient reminded that prescription medications must be kept in original, labeled, pharmacy bottle.","Old prescription bottle. Patient reminded that medications should always be kept in the newest prescription bottle.","No container available. Did not bring bottle(s) to  appointment.","Standard pharmacy container. Clearly labeled."} Filled Date: *** / *** / {Blank single:19197::"2020","2019","2018"} Last Medication intake:  {Blank single:19197::"Ran out of medicine more than 48 hours ago","Day before yesterday","Yesterday","Today"}

## 2016-10-24 NOTE — Progress Notes (Deleted)
Nursing Pain Medication Assessment:  Safety precautions to be maintained throughout the outpatient stay will include: orient to surroundings, keep bed in low position, maintain call bell within reach at all times, provide assistance with transfer out of bed and ambulation.  Medication Inspection Compliance: Pill count conducted under aseptic conditions, in front of the patient. Neither the pills nor the bottle was removed from the patient's sight at any time. Once count was completed pills were immediately returned to the patient in their original bottle.  Medication: Oxycodone IR Pill/Patch Count: 16 of 90 pills remain Pill/Patch Appearance: Markings consistent with prescribed medication Bottle Appearance: Standard pharmacy container. Clearly labeled. Filled Date: 09 / 12*** / 2018 Last Medication intake:  Today

## 2016-10-24 NOTE — Progress Notes (Deleted)
Nursing Pain Medication Assessment:  Safety precautions to be maintained throughout the outpatient stay will include: orient to surroundings, keep bed in low position, maintain call bell within reach at all times, provide assistance with transfer out of bed and ambulation.  Medication Inspection Compliance: {Blank single:19197::"Ms. Bouie did not comply with our request to bring her pills to be counted. She was reminded that bringing the medication bottles, even when empty, is a requirement.","Pill count conducted under aseptic conditions, in front of the patient. Neither the pills nor the bottle was removed from the patient's sight at any time. Once count was completed pills were immediately returned to the patient in their original bottle."}  Medication: {Blank single:19197::"Multiple medications","Buprenorphine (Suboxone)","Butorphanol (Stadol)","Duragesic patch","Fentanyl patch","Hydrocodone/APAP","Hydromorphone (Dilaudid)","Methadone","Morphine ER (MSContin)","Morphine IR","Oxycodone ER (OxyContin)","Oxycodone IR","Oxycodone/APAP","Oxymorphone (Opana)","Tapentadol (Nucynta)","Tramadol (Ultram)","See above"} Pill/Patch Count: {Blank single:19197::"No pills available to be counted.","*** of *** patches remain","*** of *** pills remain"} Pill/Patch Appearance: {Blank single:19197::"No markings","Markings inconsistent with prescribed medication","Markings consistent with prescribed medication"} Bottle Appearance: {Blank single:19197::"No label. Patient informed that medications must be transported in properly and accurately labeled containers.","Non-pharmacy container. Patient reminded that prescription medications must be kept in original, labeled, pharmacy bottle.","Old prescription bottle. Patient reminded that medications should always be kept in the newest prescription bottle.","No container available. Did not bring bottle(s) to appointment.","Standard pharmacy container. Clearly labeled."} Filled  Date: *** / *** / {Blank single:19197::"2020","2019","2018"} Last Medication intake:  {Blank single:19197::"Ran out of medicine more than 48 hours ago","Day before yesterday","Yesterday","Today"}

## 2016-10-24 NOTE — Patient Instructions (Addendum)
____________________________________________________________________________________________  Preparing for Procedure with Sedation Instructions: . Oral Intake: Do not eat or drink anything for at least 8 hours prior to your procedure. . Transportation: Public transportation is not allowed. Bring an adult driver. The driver must be physically present in our waiting room before any procedure can be started. Marland Kitchen Physical Assistance: Bring an adult physically capable of assisting you, in the event you need help. This adult should keep you company at home for at least 6 hours after the procedure. . Blood Pressure Medicine: Take your blood pressure medicine with a sip of water the morning of the procedure. . Blood thinners:  . Diabetics on insulin: Notify the staff so that you can be scheduled 1st case in the morning. If your diabetes requires high dose insulin, take only  of your normal insulin dose the morning of the procedure and notify the staff that you have done so. . Preventing infections: Shower with an antibacterial soap the morning of your procedure. . Build-up your immune system: Take 1000 mg of Vitamin C with every meal (3 times a day) the day prior to your procedure. Marland Kitchen Antibiotics: Inform the staff if you have a condition or reason that requires you to take antibiotics before dental procedures. . Pregnancy: If you are pregnant, call and cancel the procedure. . Sickness: If you have a cold, fever, or any active infections, call and cancel the procedure. . Arrival: You must be in the facility at least 30 minutes prior to your scheduled procedure. . Children: Do not bring children with you. . Dress appropriately: Bring dark clothing that you would not mind if they get stained. . Valuables: Do not bring any jewelry or valuables. Procedure appointments are reserved for interventional treatments only. Marland Kitchen No Prescription Refills. . No medication changes will be discussed during procedure  appointments. . No disability issues will be discussed. ____________________________________________________________________________________________  ____________________________________________________________________________________________  Pain Scale  Introduction: The pain score used by this practice is the Verbal Numerical Rating Scale (VNRS-11). This is an 11-point scale. It is for adults and children 10 years or older. There are significant differences in how the pain score is reported, used, and applied. Forget everything you learned in the past and learn this scoring system.  General Information: The scale should reflect your current level of pain. Unless you are specifically asked for the level of your worst pain, or your average pain. If you are asked for one of these two, then it should be understood that it is over the past 24 hours.  Basic Activities of Daily Living (ADL): Personal hygiene, dressing, eating, transferring, and using restroom.  Instructions: Most patients tend to report their level of pain as a combination of two factors, their physical pain and their psychosocial pain. This last one is also known as "suffering" and it is reflection of how physical pain affects you socially and psychologically. From now on, report them separately. From this point on, when asked to report your pain level, report only your physical pain. Use the following table for reference.  Pain Clinic Pain Levels (0-5/10)  Pain Level Score  Description  No Pain 0   Mild pain 1 Nagging, annoying, but does not interfere with basic activities of daily living (ADL). Patients are able to eat, bathe, get dressed, toileting (being able to get on and off the toilet and perform personal hygiene functions), transfer (move in and out of bed or a chair without assistance), and maintain continence (able to control bladder and bowel  functions). Blood pressure and heart rate are unaffected. A normal heart rate for  a healthy adult ranges from 60 to 100 bpm (beats per minute).   Mild to moderate pain 2 Noticeable and distracting. Impossible to hide from other people. More frequent flare-ups. Still possible to adapt and function close to normal. It can be very annoying and may have occasional stronger flare-ups. With discipline, patients may get used to it and adapt.   Moderate pain 3 Interferes significantly with activities of daily living (ADL). It becomes difficult to feed, bathe, get dressed, get on and off the toilet or to perform personal hygiene functions. Difficult to get in and out of bed or a chair without assistance. Very distracting. With effort, it can be ignored when deeply involved in activities.   Moderately severe pain 4 Impossible to ignore for more than a few minutes. With effort, patients may still be able to manage work or participate in some social activities. Very difficult to concentrate. Signs of autonomic nervous system discharge are evident: dilated pupils (mydriasis); mild sweating (diaphoresis); sleep interference. Heart rate becomes elevated (>115 bpm). Diastolic blood pressure (lower number) rises above 100 mmHg. Patients find relief in laying down and not moving.   Severe pain 5 Intense and extremely unpleasant. Associated with frowning face and frequent crying. Pain overwhelms the senses.  Ability to do any activity or maintain social relationships becomes significantly limited. Conversation becomes difficult. Pacing back and forth is common, as getting into a comfortable position is nearly impossible. Pain wakes you up from deep sleep. Physical signs will be obvious: pupillary dilation; increased sweating; goosebumps; brisk reflexes; cold, clammy hands and feet; nausea, vomiting or dry heaves; loss of appetite; significant sleep disturbance with inability to fall asleep or to remain asleep. When persistent, significant weight loss is observed due to the complete loss of appetite and  sleep deprivation.  Blood pressure and heart rate becomes significantly elevated. Caution: If elevated blood pressure triggers a pounding headache associated with blurred vision, then the patient should immediately seek attention at an urgent or emergency care unit, as these may be signs of an impending stroke.    Emergency Department Pain Levels (6-10/10)  Emergency Room Pain 6 Severely limiting. Requires emergency care and should not be seen or managed at an outpatient pain management facility. Communication becomes difficult and requires great effort. Assistance to reach the emergency department may be required. Facial flushing and profuse sweating along with potentially dangerous increases in heart rate and blood pressure will be evident.   Distressing pain 7 Self-care is very difficult. Assistance is required to transport, or use restroom. Assistance to reach the emergency department will be required. Tasks requiring coordination, such as bathing and getting dressed become very difficult.   Disabling pain 8 Self-care is no longer possible. At this level, pain is disabling. The individual is unable to do even the most "basic" activities such as walking, eating, bathing, dressing, transferring to a bed, or toileting. Fine motor skills are lost. It is difficult to think clearly.   Incapacitating pain 9 Pain becomes incapacitating. Thought processing is no longer possible. Difficult to remember your own name. Control of movement and coordination are lost.   The worst pain imaginable 10 At this level, most patients pass out from pain. When this level is reached, collapse of the autonomic nervous system occurs, leading to a sudden drop in blood pressure and heart rate. This in turn results in a temporary and dramatic drop in blood flow  to the brain, leading to a loss of consciousness. Fainting is one of the body's self defense mechanisms. Passing out puts the brain in a calmed state and causes it to shut  down for a while, in order to begin the healing process.    Summary: 1. Refer to this scale when providing [place] with your pain level. 2. Be accurate and careful when reporting your pain level. This will help with your care. 3. Over-reporting your pain level will lead to loss of credibility. 4. Even a level of 1/10 means that there is pain and will be treated at our facility. 5. High, inaccurate reporting will be documented as "Symptom Exaggeration", leading to loss of credibility and suspicions of possible secondary gains such as obtaining more narcotics, or wanting to appear disabled, for fraudulent reasons. 6. Only pain levels of 5 or below will be seen at our facility. 7. Pain levels of 6 and above will be sent to the Emergency Department and the appointment cancelled. ____________________________________________________________________________________________  Pain Management Discharge Instructions  General Discharge Instructions :  If you need to reach your doctor call: Monday-Friday 8:00 am - 4:00 pm at [phone] or toll free [phone].  After clinic hours [phone] to have operator reach doctor.  Bring all of your medication bottles to all your appointments in the pain clinic.  To cancel or reschedule your appointment with Pain Management please remember to call 24 hours in advance to avoid a fee.  Refer to the educational materials which you have been given on: General Risks, I had my Procedure. Discharge Instructions, Post Sedation.  Post Procedure Instructions:  The drugs you were given will stay in your system until tomorrow, so for the next 24 hours you should not drive, make any legal decisions or drink any alcoholic beverages.  You may eat anything you prefer, but it is better to start with liquids then soups and crackers, and gradually work up to solid foods.  Please notify your doctor immediately if you have any unusual bleeding, trouble breathing or pain that  is not related to your normal pain.  Depending on the type of procedure that was done, some parts of your body may feel week and/or numb.  This usually clears up by tonight or the next day.  Walk with the use of an assistive device or accompanied by an adult for the 24 hours.  You may use ice on the affected area for the first 24 hours.  Put ice in a Ziploc bag and cover with a towel and place against area 15 minutes on 15 minutes off.  You may switch to heat after 24 hours.Sacroiliac (SI) Joint Injection Patient Information  Description: The sacroiliac joint connects the scrum (very low back and tailbone) to the ilium (a pelvic bone which also forms half of the hip joint).  Normally this joint experiences very little motion.  When this joint becomes inflamed or unstable low back and or hip and pelvis pain may result.  Injection of this joint with local anesthetics (numbing medicines) and steroids can provide diagnostic information and reduce pain.  This injection is performed with the aid of x-ray guidance into the tailbone area while you are lying on your stomach.   You may experience an electrical sensation down the leg while this is being done.  You may also experience numbness.  We also may ask if we are reproducing your normal pain during the injection.  Conditions which may be treated SI injection:   Low  back, buttock, hip or leg pain  Preparation for the Injection:  1. Do not eat any solid food or dairy products within 8 hours of your appointment.  2. You may drink clear liquids up to 3 hours before appointment.  Clear liquids include water, black coffee, juice or soda.  No milk or cream please. 3. You may take your regular medications, including pain medications with a sip of water before your appointment.  Diabetics should hold regular insulin (if take separately) and take 1/2 normal NPH dose the morning of the procedure.  Carry some sugar containing items with you to your  appointment. 4. A driver must accompany you and be prepared to drive you home after your procedure. 5. Bring all of your current medications with you. 6. An IV may be inserted and sedation may be given at the discretion of the physician. 7. A blood pressure cuff, EKG and other monitors will often be applied during the procedure.  Some patients may need to have extra oxygen administered for a short period.  8. You will be asked to provide medical information, including your allergies, prior to the procedure.  We must know immediately if you are taking blood thinners (like Coumadin/Warfarin) or if you are allergic to IV iodine contrast (dye).  We must know if you could possible be pregnant.  Possible side effects:   Bleeding from needle site  Infection (rare, may require surgery)  Nerve injury (rare)  Numbness & tingling (temporary)  A brief convulsion or seizure  Light-headedness (temporary)  Pain at injection site (several days)  Decreased blood pressure (temporary)  Weakness in the leg (temporary)   Call if you experience:   New onset weakness or numbness of an extremity below the injection site that last more than 8 hours.  Hives or difficulty breathing ( go to the emergency room)  Inflammation or drainage at the injection site  Any new symptoms which are concerning to you  Please note:  Although the local anesthetic injected can often make your back/ hip/ buttock/ leg feel good for several hours after the injections, the pain will likely return.  It takes 3-7 days for steroids to work in the sacroiliac area.  You may not notice any pain relief for at least that one week.  If effective, we will often do a series of three injections spaced 3-6 weeks apart to maximally decrease your pain.  After the initial series, we generally will wait some months before a repeat injection of the same type.  If you have any questions, please call [phone] [facility]   Facet Joint Block The facet joints connect the bones of the spine (vertebrae). They make it possible for you to bend, twist, and make other movements with your spine. They also keep you from bending too far, twisting too far, and making other excessive movements. A facet joint block is a procedure where a numbing medicine (anesthetic) is injected into a facet joint. Often, a type of anti-inflammatory medicine called a steroid is also injected. A facet joint block may be done to diagnose neck or back pain. If the pain gets better after a facet joint block, it means the pain is probably coming from the facet joint. If the pain does not get better, it means the pain is probably not coming from the facet joint. A facet joint block may also be done to relieve neck or back pain caused by an inflamed facet joint. A  facet joint block is only done to relieve pain if the pain does not improve with other methods, such as medicine, exercise programs, and physical therapy. Tell a health care provider about:  Any allergies you have.  All medicines you are taking, including vitamins, herbs, eye drops, creams, and over-the-counter medicines.  Any problems you or family members have had with anesthetic medicines.  Any blood disorders you have.  Any surgeries you have had.  Any medical conditions you have.  Whether you are pregnant or may be pregnant. What are the risks? Generally, this is a safe procedure. However, problems may occur, including:  Bleeding.  Injury to a nerve near the injection site.  Pain at the injection site.  Weakness or numbness in areas controlled by nerves near the injection site.  Infection.  Temporary fluid retention.  Allergic reactions to medicines or dyes.  Injury to other structures or organs near the injection site.  What happens before the procedure?  Follow instructions from your health care provider about eating or drinking  restrictions.  Ask your health care provider about: ? Changing or stopping your regular medicines. This is especially important if you are taking diabetes medicines or blood thinners. ? Taking medicines such as aspirin and ibuprofen. These medicines can thin your blood. Do not take these medicines before your procedure if your health care provider instructs you not to.  Do not take any new dietary supplements or medicines without asking your health care provider first.  Plan to have someone take you home after the procedure. What happens during the procedure?  You may need to remove your clothing and dress in an open-back gown.  The procedure will be done while you are lying on an X-ray table. You will most likely be asked to lie on your stomach, but you may be asked to lie in a different position if an injection will be made in your neck.  Machines will be used to monitor your oxygen levels, heart rate, and blood pressure.  If an injection will be made in your neck, an IV tube will be inserted into one of your veins. Fluids and medicine will flow directly into your body through the IV tube.  The area over the facet joint where the injection will be made will be cleaned with soap. The surrounding skin will be covered with clean drapes.  A numbing medicine (local anesthetic) will be applied to your skin. Your skin may sting or burn for a moment.  A video X-ray machine (fluoroscopy) will be used to locate the joint. In some cases, a CT scan may be used.  A contrast dye may be injected into the facet joint area to help locate the joint.  When the joint is located, an anesthetic will be injected into the joint through the needle.  Your health care provider will ask you whether you feel pain relief. If you do feel relief, a steroid may be injected to provide pain relief for a longer period of time. If you do not feel relief or feel only partial relief, additional injections of an anesthetic  may be made in other facet joints.  The needle will be removed.  Your skin will be cleaned.  A bandage (dressing) will be applied over each injection site. The procedure may vary among health care providers and hospitals. What happens after the procedure?  You will be observed for 15-30 minutes before being allowed to go home. This information is not intended to replace advice  given to you by your health care provider. Make sure you discuss any questions you have with your health care provider. Document Released: 05/23/2006 Document Revised: 02/02/2015 Document Reviewed: 09/27/2014 Elsevier Interactive Patient Education  2018 [employer]  Facet Joint Block, Care After Refer to this sheet in the next few weeks. These instructions provide you with information about caring for yourself after your procedure. Your health care provider may also give you more specific instructions. Your treatment has been planned according to current medical practices, but problems sometimes occur. Call your health care provider if you have any problems or questions after your procedure. What can I expect after the procedure? After the procedure, it is common to have:  Some tenderness over the injection sites for 2 days after the procedure.  A temporary increase in blood sugar if you have diabetes.  Follow these instructions at home:  Keep track of the amount of pain relief you feel and how long it lasts.  Take over-the-counter and prescription medicines only as told by your health care provider. You may need to limit pain medicine within the first 4-6 hours after the procedure.  Remove your bandages (dressings) the morning after the procedure.  For the first 24 hours after the procedure: ? Do not apply heat near or over the injection sites. ? Do not take a bath or soak in water, such as in a pool or lake. ? Do not drive or operate heavy machinery unless approved by your health care provider. ? Avoid  activities that require a lot of energy.  If the injection site is tender, try applying ice to the area. To do this: ? Put ice in a plastic bag. ? Place a towel between your skin and the bag. ? Leave the ice on for 20 minutes, 2-3 times a day.  Keep all follow-up visits as told by your health care provider. This is important. Contact a health care provider if:  Fluid is coming from an injection site.  There is significant bleeding or swelling at an injection site.  You have diabetes and your blood sugar is above 180 mg/dL. Get help right away if:  You have a fever.  You have worsening pain or swelling around an injection site.  There are red streaks around an injection site.  You develop severe pain that is not controlled by your medicines.  You develop a headache, stiff neck, nausea, or vomiting.  Your eyes become very sensitive to light.  You have weakness, paralysis, or tingling in your arms or legs that was not present before the procedure.  You have difficulty urinating or breathing. This information is not intended to replace advice given to you by your health care provider. Make sure you discuss any questions you have with your health care provider. Document Released: 12/19/2011 Document Revised: 05/18/2015 Document Reviewed: 09/27/2014 Elsevier Interactive Patient Education  [employer].

## 2016-11-01 ENCOUNTER — Ambulatory Visit: Payer: BLUE CROSS/BLUE SHIELD | Admitting: Pain Medicine

## 2016-11-14 NOTE — Progress Notes (Signed)
Patient's Name: Lorraine Hutchinson  MRN: [id number]  Referring Provider: Wayland Denis, PA-C  DOB: August 07, 1960  PCP: Wayland Denis, PA-C  DOS: 11/15/2016  Note by: Gaspar Cola, MD  Service setting: Ambulatory outpatient  Specialty: Interventional Pain Management  Patient type: Established  Location: [facility] (AMB) Pain Management Facility  Visit type: Interventional Procedure   Primary Reason for Visit: Interventional Pain Management Treatment. CC: Back Pain (low)  Procedure:  Anesthesia, Analgesia, Anxiolysis:  Procedure #1: Type: Diagnostic Medial Branch Facet Block #1 Region: Lumbar Level: L2, L3, L4, L5, & S1 Medial Branch Level(s) Laterality: Bilateral  Procedure #2: Type: Diagnostic Sacroiliac Joint Block #1 Region: Posterior Lumbosacral Level: PSIS (Posterior Superior Iliac Spine) Sacroiliac Joint Laterality: Bilateral  Type: Local Anesthesia with Moderate (Conscious) Sedation Local Anesthetic: Lidocaine 1% Route: Intravenous (IV) IV Access: Secured Sedation: Meaningful verbal contact was maintained at all times during the procedure  Indication(s): Analgesia and Anxiety   Indications: 1. Lumbar facet syndrome (Bilateral) (R>L)   2. Lumbar facet arthropathy (Bilateral)   3. Chronic sacroiliac joint pain (Bilateral) (R>L)   4. Spondylosis of lumbar spine   5. Chronic low back pain (Primary Area of Pain) (Bilateral) (R>L)   6. Groin pain, right    Pain Score: Pre-procedure: 5 /10 Post-procedure: 0-No pain/10  Pre-op Assessment:  Lorraine Hutchinson is a 56 y.o. (year old), female patient, seen today for interventional treatment. She  has a past surgical history that includes Replacement total knee (Right); Appendectomy; Cesarean section; Abdominal hysterectomy; and Tonsillectomy. Lorraine Hutchinson has a current medication list which includes the following prescription(s): alpha-lipoic acid, th vitamin b 50/b-complex, b-d uf iii mini pen needles, buspirone, calcium-vitamin d,  cinnamon, dextromethorphan-guaifenesin, diclofenac, diltiazem, duloxetine, insulin aspart protamine - aspart, lisinopril-hydrochlorothiazide, magnesium oxide, melatonin, metformin, multiple vitamins-minerals, omeprazole, oxycodone, potassium, pravastatin, trazodone, and metformin, and the following Facility-Administered Medications: fentanyl and midazolam. Her primarily concern today is the Back Pain (low)  Initial Vital Signs: There were no vitals taken for this visit. BMI: Estimated body mass index is 59.07 kg/m as calculated from the following:   Height as of this encounter: 5\' 9"  (1.753 m).   Weight as of this encounter: 400 lb (181.4 kg).  Risk Assessment: Allergies: Reviewed. She is allergic to codeine.  Allergy Precautions: None required Coagulopathies: Reviewed. None identified.  Blood-thinner therapy: None at this time Active Infection(s): Reviewed. None identified. Lorraine Hutchinson is afebrile  Site Confirmation: Lorraine Hutchinson was asked to confirm the procedure and laterality before marking the site Procedure checklist: Completed Consent: Before the procedure and under the influence of no sedative(s), amnesic(s), or anxiolytics, the patient was informed of the treatment options, risks and possible complications. To fulfill our ethical and legal obligations, as recommended by the American Medical Association's Code of Ethics, I have informed the patient of my clinical impression; the nature and purpose of the treatment or procedure; the risks, benefits, and possible complications of the intervention; the alternatives, including doing nothing; the risk(s) and benefit(s) of the alternative treatment(s) or procedure(s); and the risk(s) and benefit(s) of doing nothing. The patient was provided information about the general risks and possible complications associated with the procedure. These may include, but are not limited to: failure to achieve desired goals, infection, bleeding, organ or nerve  damage, allergic reactions, paralysis, and death. In addition, the patient was informed of those risks and complications associated to Spine-related procedures, such as failure to decrease pain; infection (i.e.: Meningitis, epidural or intraspinal abscess); bleeding (i.e.: epidural hematoma, subarachnoid hemorrhage, or  any other type of intraspinal or peri-dural bleeding); organ or nerve damage (i.e.: Any type of peripheral nerve, nerve root, or spinal cord injury) with subsequent damage to sensory, motor, and/or autonomic systems, resulting in permanent pain, numbness, and/or weakness of one or several areas of the body; allergic reactions; (i.e.: anaphylactic reaction); and/or death. Furthermore, the patient was informed of those risks and complications associated with the medications. These include, but are not limited to: allergic reactions (i.e.: anaphylactic or anaphylactoid reaction(s)); adrenal axis suppression; blood sugar elevation that in diabetics may result in ketoacidosis or comma; water retention that in patients with history of congestive heart failure may result in shortness of breath, pulmonary edema, and decompensation with resultant heart failure; weight gain; swelling or edema; medication-induced neural toxicity; particulate matter embolism and blood vessel occlusion with resultant organ, and/or nervous system infarction; and/or aseptic necrosis of one or more joints. Finally, the patient was informed that Medicine is not an exact science; therefore, there is also the possibility of unforeseen or unpredictable risks and/or possible complications that may result in a catastrophic outcome. The patient indicated having understood very clearly. We have given the patient no guarantees and we have made no promises. Enough time was given to the patient to ask questions, all of which were answered to the patient's satisfaction. Lorraine Hutchinson has indicated that she wanted to continue with the  procedure. Attestation: I, the ordering provider, attest that I have discussed with the patient the benefits, risks, side-effects, alternatives, likelihood of achieving goals, and potential problems during recovery for the procedure that I have provided informed consent. Date: 11/15/2016; Time: 8:06 AM  Pre-Procedure Preparation:  Monitoring: As per clinic protocol. Respiration, ETCO2, SpO2, BP, heart rate and rhythm monitor placed and checked for adequate function Safety Precautions: Patient was assessed for positional comfort and pressure points before starting the procedure. Time-out: I initiated and conducted the "Time-out" before starting the procedure, as per protocol. The patient was asked to participate by confirming the accuracy of the "Time Out" information. Verification of the correct person, site, and procedure were performed and confirmed by me, the nursing staff, and the patient. "Time-out" conducted as per Joint Commission's Universal Protocol (UP.01.01.01). "Time-out" Date & Time: 11/15/2016; 0840 hrs.  Description of Procedure #1 Process:   Time-out: "Time-out" completed before starting procedure, as per protocol. Position: Prone Target Area: For Lumbar Facet blocks, the target is the groove formed by the junction of the transverse process and superior articular process. For the L5 dorsal ramus, the target is the notch between superior articular process and sacral ala. For the S1 dorsal ramus, the target is the superior and lateral edge of the posterior S1 Sacral foramen. Approach: Paramedial approach. Area Prepped: Entire Posterior Lumbosacral Region Prepping solution: ChloraPrep (2% chlorhexidine gluconate and 70% isopropyl alcohol) Safety Precautions: Aspiration looking for blood return was conducted prior to all injections. At no point did we inject any substances, as a needle was being advanced. No attempts were made at seeking any paresthesias. Safe injection practices and  needle disposal techniques used. Medications properly checked for expiration dates. SDV (single dose vial) medications used.  Description of the Procedure: Protocol guidelines were followed. The patient was placed in position over the fluoroscopy table. The target area was identified and the area prepped in the usual manner. Skin desensitized using vapocoolant spray. Skin & deeper tissues infiltrated with local anesthetic. Appropriate amount of time allowed to pass for local anesthetics to take effect. The procedure needle was introduced through  the skin, ipsilateral to the reported pain, and advanced to the target area. Employing the "Medial Branch Technique", the needles were advanced to the angle made by the superior and medial portion of the transverse process, and the lateral and inferior portion of the superior articulating process of the targeted vertebral bodies. This area is known as "Burton's Eye" or the "Eye of the [place] Dog". A procedure needle was introduced through the skin, and this time advanced to the angle made by the superior and medial border of the sacral ala, and the lateral border of the S1 vertebral body. This last needle was later repositioned at the superior and lateral border of the posterior S1 foramen. Negative aspiration confirmed. Solution injected in intermittent fashion, asking for systemic symptoms every 0.5cc of injectate. The needles were then removed and the area cleansed, making sure to leave some of the prepping solution back to take advantage of its long term bactericidal properties. Start Time: 0840 hrs. Materials:  Needle(s) Type: Regular needle Gauge: 22G Length: 7-in Medication(s): We administered lactated ringers, midazolam, fentaNYL, lidocaine, triamcinolone acetonide, ropivacaine (PF) 2 mg/mL (0.2%), triamcinolone acetonide, ropivacaine (PF) 2 mg/mL (0.2%), ropivacaine (PF) 2 mg/mL (0.2%), methylPREDNISolone acetate, orphenadrine, and ketorolac. Please see  chart orders for dosing details.  Description of Procedure # 2 Process:   Position: Prone Target Area: For upper sacroiliac joint block(s), the target is the superior and posterior margin of the sacroiliac joint. Approach: Ipsilateral approach. Area Prepped: Entire Posterior Lumbosacral Region Prepping solution: ChloraPrep (2% chlorhexidine gluconate and 70% isopropyl alcohol) Safety Precautions: Aspiration looking for blood return was conducted prior to all injections. At no point did we inject any substances, as a needle was being advanced. No attempts were made at seeking any paresthesias. Safe injection practices and needle disposal techniques used. Medications properly checked for expiration dates. SDV (single dose vial) medications used. Description of the Procedure: Protocol guidelines were followed. The patient was placed in position over the fluoroscopy table. The target area was identified and the area prepped in the usual manner. Skin desensitized using vapocoolant spray. Skin & deeper tissues infiltrated with local anesthetic. Appropriate amount of time allowed to pass for local anesthetics to take effect. The procedure needle was advanced under fluoroscopic guidance into the sacroiliac joint until a firm endpoint was obtained. Proper needle placement secured. Negative aspiration confirmed. Solution injected in intermittent fashion, asking for systemic symptoms every 0.5cc of injectate. The needles were then removed and the area cleansed, making sure to leave some of the prepping solution back to take advantage of its long term bactericidal properties. Vitals:   11/15/16 0910 11/15/16 0920 11/15/16 0930 11/15/16 0940  BP: 136/79 131/74 109/64 113/76  Pulse: 96     Resp: 16 12 19 15   Temp:  (!) 97.4 F (36.3 C)    TempSrc:  Temporal    SpO2: 96% 97% 96% 98%  Weight:      Height:        End Time: 0910 hrs. Materials:  Needle(s) Type: Regular needle Gauge: 22G Length:  7-in Medication(s): We administered lactated ringers, midazolam, fentaNYL, lidocaine, triamcinolone acetonide, ropivacaine (PF) 2 mg/mL (0.2%), triamcinolone acetonide, ropivacaine (PF) 2 mg/mL (0.2%), ropivacaine (PF) 2 mg/mL (0.2%), methylPREDNISolone acetate, orphenadrine, and ketorolac. Please see chart orders for dosing details.  Imaging Guidance (Spinal):  Type of Imaging Technique: Fluoroscopy Guidance (Spinal) Indication(s): Assistance in needle guidance and placement for procedures requiring needle placement in or near specific anatomical locations not easily accessible without such assistance. Exposure  Time: Please see nurses notes. Contrast: None used. Fluoroscopic Guidance: I was personally present during the use of fluoroscopy. "Tunnel Vision Technique" used to obtain the best possible view of the target area. Parallax error corrected before commencing the procedure. "Direction-depth-direction" technique used to introduce the needle under continuous pulsed fluoroscopy. Once target was reached, antero-posterior, oblique, and lateral fluoroscopic projection used confirm needle placement in all planes. Images permanently stored in EMR. Interpretation: No contrast injected. I personally interpreted the imaging intraoperatively. Adequate needle placement confirmed in multiple planes. Permanent images saved into the patient's record.  Antibiotic Prophylaxis:  Indication(s): None identified Antibiotic given: None  Post-operative Assessment:  EBL: None Complications: No immediate post-treatment complications observed by team, or reported by patient. The patient did experience some transient increase in pain on the right SI joint, referred towards the right groin, after the procedure. This is likely to have been secondary to a possible rupture in the anterior portion of the SI joint capsule leading to leakage of content into the anterior pelvic area. This is occasionally seen with  intra-articular SI joint injections over the top portion of the SI joint as we did today. We provided the patient with a Toradol/Norflex 60/60 IM and informed the patient not put any weight on the leg if this was numb. This can occasionally occur as a consequence of leakage of local anesthetic to the anterior portion of the capsule. The local anesthetic and end up in the area of the lower lumbar plexus/sciatic nerve leading to numbness and weakness. This discomfort should last for a couple days and therefore we provided the patient with a work excuse for a couple days. Note: The patient tolerated the entire procedure well. A repeat set of vitals were taken after the procedure and the patient was kept under observation following institutional policy, for this type of procedure. Post-procedural neurological assessment was performed, showing return to baseline, prior to discharge. The patient was provided with post-procedure discharge instructions, including a section on how to identify potential problems. Should any problems arise concerning this procedure, the patient was given instructions to immediately contact us, at any time, without hesitation. In any case, we plan to contact the patient by telephone for a follow-up status report regarding this interventional procedure. Comments:  No additional relevant information.  Plan of Care    Imaging Orders     DG C-Arm 1-60 Min-No Report  Procedure Orders     LUMBAR FACET(MEDIAL BRANCH NERVE BLOCK) MBNB     SACROILIAC JOINT INJECTINS  Medications ordered for procedure: Meds ordered this encounter  Medications  . lactated ringers infusion 1,000 mL  . midazolam (VERSED) 5 MG/5ML injection 1-2 mg    Make sure Flumazenil is available in the pyxis when using this medication. If oversedation occurs, administer 0.2 mg IV over 15 sec. If after 45 sec no response, administer 0.2 mg again over 1 min; may repeat at 1 min intervals; not to exceed 4 doses (1 mg)   . fentaNYL (SUBLIMAZE) injection 25-50 mcg    Make sure Narcan is available in the pyxis when using this medication. In the event of respiratory depression (RR< 8/min): Titrate NARCAN (naloxone) in increments of 0.1 to 0.2 mg IV at 2-3 minute intervals, until desired degree of reversal.  . lidocaine (XYLOCAINE) 2 % (with pres) injection 200 mg  . triamcinolone acetonide (KENALOG-40) injection 40 mg  . ropivacaine (PF) 2 mg/mL (0.2%) (NAROPIN) injection 9 mL  . triamcinolone acetonide (KENALOG-40) injection 40 mg  .  ropivacaine (PF) 2 mg/mL (0.2%) (NAROPIN) injection 9 mL  . ropivacaine (PF) 2 mg/mL (0.2%) (NAROPIN) injection 9 mL  . methylPREDNISolone acetate (DEPO-MEDROL) injection 80 mg  . orphenadrine (NORFLEX) injection 60 mg  . ketorolac (TORADOL) injection 60 mg   Medications administered: We administered lactated ringers, midazolam, fentaNYL, lidocaine, triamcinolone acetonide, ropivacaine (PF) 2 mg/mL (0.2%), triamcinolone acetonide, ropivacaine (PF) 2 mg/mL (0.2%), ropivacaine (PF) 2 mg/mL (0.2%), methylPREDNISolone acetate, orphenadrine, and ketorolac.  See the medical record for exact dosing, route, and time of administration.  New Prescriptions   No medications on file   Disposition: Discharge home  Discharge Date & Time: 11/15/2016; 0955 hrs.   Physician-requested Follow-up: Return for post-procedure eval by Dr. Dossie Arbour in 2 wks. Future Appointments Date Time Provider [place]  12/03/2016 2:00 PM Milinda Pointer, MD [facility] None   Primary Care Physician: Wayland Denis, PA-C Location: [facility] Note by: Gaspar Cola, MD Date: 11/15/2016; Time: 11:01 AM  Disclaimer:  Medicine is not an exact science. The only guarantee in medicine is that nothing is guaranteed. It is important to note that the decision to proceed with this intervention was based on the information collected from the patient. The Data and conclusions  were drawn from the patient's questionnaire, the interview, and the physical examination. Because the information was provided in large part by the patient, it cannot be guaranteed that it has not been purposely or unconsciously manipulated. Every effort has been made to obtain as much relevant data as possible for this evaluation. It is important to note that the conclusions that lead to this procedure are derived in large part from the available data. Always take into account that the treatment will also be dependent on availability of resources and existing treatment guidelines, considered by other Pain Management Practitioners as being common knowledge and practice, at the time of the intervention. For Medico-Legal purposes, it is also important to point out that variation in procedural techniques and pharmacological choices are the acceptable norm. The indications, contraindications, technique, and results of the above procedure should only be interpreted and judged by a Board-Certified Interventional Pain Specialist with extensive familiarity and expertise in the same exact procedure and technique.

## 2016-11-15 ENCOUNTER — Encounter: Payer: Self-pay | Admitting: Pain Medicine

## 2016-11-15 ENCOUNTER — Ambulatory Visit (HOSPITAL_BASED_OUTPATIENT_CLINIC_OR_DEPARTMENT_OTHER): Payer: BLUE CROSS/BLUE SHIELD | Admitting: Pain Medicine

## 2016-11-15 ENCOUNTER — Ambulatory Visit
Admission: RE | Admit: 2016-11-15 | Discharge: 2016-11-15 | Disposition: A | Discharge status: Home / Self Care | Payer: BLUE CROSS/BLUE SHIELD | Source: Ambulatory Visit | Attending: Pain Medicine | Admitting: Pain Medicine

## 2016-11-15 VITALS — BP 113/76 | HR 96 | Temp 97.4°F | Resp 15 | Ht 69.0 in | Wt >= 6400 oz

## 2016-11-15 DIAGNOSIS — Z885 Allergy status to narcotic agent status: Secondary | ICD-10-CM | POA: Insufficient documentation

## 2016-11-15 DIAGNOSIS — M533 Sacrococcygeal disorders, not elsewhere classified: Secondary | ICD-10-CM | POA: Insufficient documentation

## 2016-11-15 DIAGNOSIS — M5441 Lumbago with sciatica, right side: Secondary | ICD-10-CM

## 2016-11-15 DIAGNOSIS — Z96651 Presence of right artificial knee joint: Secondary | ICD-10-CM | POA: Diagnosis not present

## 2016-11-15 DIAGNOSIS — M47816 Spondylosis without myelopathy or radiculopathy, lumbar region: Secondary | ICD-10-CM | POA: Insufficient documentation

## 2016-11-15 DIAGNOSIS — Z90710 Acquired absence of both cervix and uterus: Secondary | ICD-10-CM | POA: Insufficient documentation

## 2016-11-15 DIAGNOSIS — Z9889 Other specified postprocedural states: Secondary | ICD-10-CM | POA: Insufficient documentation

## 2016-11-15 DIAGNOSIS — G8929 Other chronic pain: Secondary | ICD-10-CM | POA: Insufficient documentation

## 2016-11-15 DIAGNOSIS — R1031 Right lower quadrant pain: Secondary | ICD-10-CM | POA: Insufficient documentation

## 2016-11-15 MED ORDER — ORPHENADRINE CITRATE 30 MG/ML IJ SOLN
60.0000 mg | Freq: Once | INTRAMUSCULAR | Status: AC
  Administered 2016-11-15: 60 mg via INTRAMUSCULAR

## 2016-11-15 MED ORDER — TRIAMCINOLONE ACETONIDE 40 MG/ML IJ SUSP
40.0000 mg | Freq: Once | INTRAMUSCULAR | Status: AC
Start: 2016-11-15 — End: 2016-11-15
  Administered 2016-11-15: 40 mg
  Filled 2016-11-15: qty 1

## 2016-11-15 MED ORDER — METHYLPREDNISOLONE ACETATE 80 MG/ML IJ SUSP
80.0000 mg | Freq: Once | INTRAMUSCULAR | Status: AC
  Administered 2016-11-15: 80 mg via INTRA_ARTICULAR
  Filled 2016-11-15: qty 1

## 2016-11-15 MED ORDER — KETOROLAC TROMETHAMINE 60 MG/2ML IM SOLN
60.0000 mg | Freq: Once | INTRAMUSCULAR | Status: AC
  Administered 2016-11-15: 60 mg via INTRAMUSCULAR

## 2016-11-15 MED ORDER — FENTANYL CITRATE (PF) 100 MCG/2ML IJ SOLN
25.0000 ug | INTRAMUSCULAR | Status: DC | PRN
  Administered 2016-11-15: 100 ug via INTRAVENOUS
  Filled 2016-11-15: qty 2

## 2016-11-15 MED ORDER — LACTATED RINGERS IV SOLN
1000.0000 mL | Freq: Once | INTRAVENOUS | Status: AC
  Administered 2016-11-15: 1000 mL via INTRAVENOUS

## 2016-11-15 MED ORDER — KETOROLAC TROMETHAMINE 60 MG/2ML IM SOLN
INTRAMUSCULAR | Status: AC
  Filled 2016-11-15: qty 2

## 2016-11-15 MED ORDER — TRIAMCINOLONE ACETONIDE 40 MG/ML IJ SUSP
40.0000 mg | Freq: Once | INTRAMUSCULAR | Status: AC
  Administered 2016-11-15: 40 mg
  Filled 2016-11-15: qty 1

## 2016-11-15 MED ORDER — ROPIVACAINE HCL 2 MG/ML IJ SOLN
9.0000 mL | Freq: Once | INTRAMUSCULAR | Status: AC
  Administered 2016-11-15: 9 mL via PERINEURAL
  Filled 2016-11-15: qty 10

## 2016-11-15 MED ORDER — ROPIVACAINE HCL 2 MG/ML IJ SOLN
9.0000 mL | Freq: Once | INTRAMUSCULAR | Status: AC
  Administered 2016-11-15: 9 mL via INTRA_ARTICULAR
  Filled 2016-11-15: qty 10

## 2016-11-15 MED ORDER — MIDAZOLAM HCL 5 MG/5ML IJ SOLN
1.0000 mg | INTRAMUSCULAR | Status: DC | PRN
  Administered 2016-11-15: 3 mg via INTRAVENOUS
  Filled 2016-11-15: qty 5

## 2016-11-15 MED ORDER — ORPHENADRINE CITRATE 30 MG/ML IJ SOLN
INTRAMUSCULAR | Status: AC
  Filled 2016-11-15: qty 2

## 2016-11-15 MED ORDER — LIDOCAINE HCL 2 % IJ SOLN
10.0000 mL | Freq: Once | INTRAMUSCULAR | Status: AC
  Administered 2016-11-15: 200 mg
  Filled 2016-11-15: qty 40

## 2016-11-15 NOTE — Progress Notes (Signed)
Safety precautions to be maintained throughout the outpatient stay will include: orient to surroundings, keep bed in low position, maintain call bell within reach at all times, provide assistance with transfer out of bed and ambulation.  

## 2016-11-15 NOTE — Patient Instructions (Signed)

## 2016-11-15 NOTE — Progress Notes (Signed)
Patient having difficulty with right leg and lifting, creating pain in the groin area.  Reported to Dr Dossie Arbour.  Order for Toradol 60 mg and Norflex 60 mg IM.  Administered left and right gluteal respectively. Patient is able to bare weight on the right leg but was cautioned that with the time the leg may become more numb and to use walker at home until the numbness has subsided.  Patient verbalizes u/o information.

## 2016-11-16 ENCOUNTER — Telehealth: Payer: Self-pay

## 2016-11-16 NOTE — Telephone Encounter (Signed)
Post procedure phone call.  Patient states she is doing fine.  Having some soreness, but overall doing better.

## 2016-12-03 ENCOUNTER — Encounter: Payer: Self-pay | Admitting: Nurse Practitioner

## 2016-12-03 ENCOUNTER — Ambulatory Visit: Payer: BLUE CROSS/BLUE SHIELD | Attending: Pain Medicine | Admitting: Nurse Practitioner

## 2016-12-03 VITALS — BP 157/86 | HR 101 | Temp 98.2°F | Resp 16 | Ht 69.0 in | Wt >= 6400 oz

## 2016-12-03 DIAGNOSIS — Z885 Allergy status to narcotic agent status: Secondary | ICD-10-CM | POA: Insufficient documentation

## 2016-12-03 DIAGNOSIS — M79604 Pain in right leg: Secondary | ICD-10-CM | POA: Diagnosis not present

## 2016-12-03 DIAGNOSIS — I10 Essential (primary) hypertension: Secondary | ICD-10-CM | POA: Insufficient documentation

## 2016-12-03 DIAGNOSIS — G894 Chronic pain syndrome: Secondary | ICD-10-CM | POA: Diagnosis not present

## 2016-12-03 DIAGNOSIS — Z90710 Acquired absence of both cervix and uterus: Secondary | ICD-10-CM | POA: Insufficient documentation

## 2016-12-03 DIAGNOSIS — M5441 Lumbago with sciatica, right side: Secondary | ICD-10-CM

## 2016-12-03 DIAGNOSIS — Z87891 Personal history of nicotine dependence: Secondary | ICD-10-CM | POA: Diagnosis not present

## 2016-12-03 DIAGNOSIS — Z794 Long term (current) use of insulin: Secondary | ICD-10-CM | POA: Diagnosis not present

## 2016-12-03 DIAGNOSIS — R7982 Elevated C-reactive protein (CRP): Secondary | ICD-10-CM | POA: Insufficient documentation

## 2016-12-03 DIAGNOSIS — Z79891 Long term (current) use of opiate analgesic: Secondary | ICD-10-CM | POA: Diagnosis not present

## 2016-12-03 DIAGNOSIS — M542 Cervicalgia: Secondary | ICD-10-CM | POA: Diagnosis not present

## 2016-12-03 DIAGNOSIS — G4733 Obstructive sleep apnea (adult) (pediatric): Secondary | ICD-10-CM | POA: Diagnosis not present

## 2016-12-03 DIAGNOSIS — R1031 Right lower quadrant pain: Secondary | ICD-10-CM | POA: Insufficient documentation

## 2016-12-03 DIAGNOSIS — Z79899 Other long term (current) drug therapy: Secondary | ICD-10-CM | POA: Diagnosis not present

## 2016-12-03 DIAGNOSIS — M533 Sacrococcygeal disorders, not elsewhere classified: Secondary | ICD-10-CM | POA: Diagnosis not present

## 2016-12-03 DIAGNOSIS — E559 Vitamin D deficiency, unspecified: Secondary | ICD-10-CM | POA: Insufficient documentation

## 2016-12-03 DIAGNOSIS — J449 Chronic obstructive pulmonary disease, unspecified: Secondary | ICD-10-CM | POA: Insufficient documentation

## 2016-12-03 DIAGNOSIS — M79605 Pain in left leg: Secondary | ICD-10-CM

## 2016-12-03 DIAGNOSIS — M419 Scoliosis, unspecified: Secondary | ICD-10-CM | POA: Diagnosis not present

## 2016-12-03 DIAGNOSIS — E785 Hyperlipidemia, unspecified: Secondary | ICD-10-CM | POA: Diagnosis not present

## 2016-12-03 DIAGNOSIS — Z6841 Body Mass Index (BMI) 40.0 and over, adult: Secondary | ICD-10-CM | POA: Insufficient documentation

## 2016-12-03 DIAGNOSIS — E782 Mixed hyperlipidemia: Secondary | ICD-10-CM | POA: Insufficient documentation

## 2016-12-03 DIAGNOSIS — Z833 Family history of diabetes mellitus: Secondary | ICD-10-CM | POA: Insufficient documentation

## 2016-12-03 DIAGNOSIS — F329 Major depressive disorder, single episode, unspecified: Secondary | ICD-10-CM | POA: Diagnosis not present

## 2016-12-03 DIAGNOSIS — M48061 Spinal stenosis, lumbar region without neurogenic claudication: Secondary | ICD-10-CM | POA: Insufficient documentation

## 2016-12-03 DIAGNOSIS — G8929 Other chronic pain: Secondary | ICD-10-CM | POA: Diagnosis not present

## 2016-12-03 DIAGNOSIS — E1142 Type 2 diabetes mellitus with diabetic polyneuropathy: Secondary | ICD-10-CM | POA: Insufficient documentation

## 2016-12-03 DIAGNOSIS — M545 Low back pain: Secondary | ICD-10-CM | POA: Insufficient documentation

## 2016-12-03 DIAGNOSIS — Z96651 Presence of right artificial knee joint: Secondary | ICD-10-CM | POA: Diagnosis not present

## 2016-12-03 DIAGNOSIS — F411 Generalized anxiety disorder: Secondary | ICD-10-CM | POA: Insufficient documentation

## 2016-12-03 DIAGNOSIS — M5116 Intervertebral disc disorders with radiculopathy, lumbar region: Secondary | ICD-10-CM | POA: Insufficient documentation

## 2016-12-03 DIAGNOSIS — M4726 Other spondylosis with radiculopathy, lumbar region: Secondary | ICD-10-CM | POA: Insufficient documentation

## 2016-12-03 DIAGNOSIS — M7541 Impingement syndrome of right shoulder: Secondary | ICD-10-CM | POA: Insufficient documentation

## 2016-12-03 MED ORDER — OXYCODONE HCL 5 MG PO TABS: 5.0000 mg | ORAL_TABLET | Freq: Three times a day (TID) | ORAL | 0 refills | Status: DC | PRN

## 2016-12-03 MED ORDER — CYCLOBENZAPRINE HCL 10 MG PO TABS: 10.0000 mg | ORAL_TABLET | Freq: Three times a day (TID) | ORAL | 0 refills | Status: AC | PRN

## 2016-12-03 NOTE — Progress Notes (Signed)
Patient's Name: Lorraine Hutchinson  MRN: [id number]  Referring Provider: Wayland Denis, PA-C  DOB: 1960/11/20  PCP: Wayland Denis, PA-C  DOS: 12/03/2016  Note by: Vevelyn Francois NP  Service setting: Ambulatory outpatient  Specialty: Interventional Pain Management  Location: [facility] (AMB) Pain Management Facility    Patient type: Established    Primary Reason(s) for Visit: Encounter for prescription drug management & post-procedure evaluation of chronic illness with mild to moderate exacerbation(Level of risk: moderate) CC: Back Pain (lower bilateral)  HPI  Lorraine Hutchinson is a 56 y.o. year old, female patient, who comes today for a post-procedure evaluation and medication management. She has Chronic depression; COPD, moderate ([place]); Essential hypertension; Former cigarette smoker; History of type 2 diabetes mellitus; Obesity, morbid, BMI 50 or higher ([place]); OSA (obstructive sleep apnea); Vitamin D deficiency, unspecified; Long term current use of opiate analgesic; Long term prescription opiate use; Opiate use; Chronic sacroiliac joint pain (Bilateral) (R>L); Chronic neck pain (Tertiary Area of Pain)( (R>L); Encounter for screening for nutritional disorder; Chronic knee pain (Right); Shoulder impingement syndrome (Right); Lumbar facet osteoarthritis (Bilateral); Lumbar facet syndrome (Bilateral) (R>L); Lumbar facet arthralgia (Bilateral) (R>L); Lumbar facet arthropathy (Bilateral); DDD (degenerative disc disease), lumbar (L2-3); Spondylosis of lumbar spine; Chronic pain of lower extremity (Secondary Area of Pain) (R>L); Chronic shoulder pain (Fourth Area of Pain) (R>L); Elevated C-reactive protein (CRP); Hypomagnesemia; Chronic pain syndrome; Chronic lumbar radicular pain (Right) (L4 Dermatome); Chronic low back pain (Primary Area of Pain) (Bilateral) (R>L); Generalized anxiety disorder; Hyperlipidemia, mixed; Type 2 diabetes mellitus with peripheral neuropathy ([place]); Pain medication agreement signed;  Abnormal MRI, lumbar spine (10/21/2016); 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 Her primarily concern today is the Back Pain (lower bilateral)  Pain Assessment: Location: Lower, Left, Right Back Radiating: down both legs to the calf  Onset: More than a month ago Duration: Chronic pain Quality: Sharp, Cramping, Spasm, Burning Severity: 4 /10 (self-reported pain score)  Note: Reported level is compatible with observation.                          Effect on ADL: upon rising from certain positions pain becomes worse. activity makes worse Timing: Intermittent Modifying factors: nothing is currently helping the pain  Lorraine Hutchinson was last seen on 09/18/2016 for a procedure. During today's appointment we reviewed Lorraine Hutchinson's post-procedure results, as well as her outpatient medication regimen. She is having left sided back pain that is worse. She states that the leg pain seems to be worse. She is having muscle spasms. She is also having burning. She has failed the first set of injections. She had failed interventional therapy in the past at others clinics.    Further details on both, my assessment(s), as well as the proposed treatment plan, please see below.  Controlled Substance Pharmacotherapy Assessment REMS (Risk Evaluation and Mitigation Strategy)  Analgesic: Oxycodone IR 5 mg 1 tablet by mouth every 8 hours (15 mg/day of oxycodone) (22.5 MME/day) MME/day: 22.5 mg/day.  [name], RN  12/03/2016  3:22 PM  Sign at close encounter Nursing Pain Medication Assessment:  Safety precautions to be maintained throughout the outpatient stay will include: orient to surroundings, keep bed in low position, maintain call bell within reach at all times, provide assistance with transfer out of bed and ambulation.  Medication Inspection Compliance: Pill count conducted under aseptic conditions, in front of the patient. Neither the pills  nor  the bottle was removed from the patient's sight at any time. Once count was completed pills were immediately returned to the patient in their original bottle.  Medication: Oxycodone IR Pill/Patch Count: 4 of 90 pills remain Pill/Patch Appearance: Markings consistent with prescribed medication Bottle Appearance: Standard pharmacy container. Clearly labeled. Filled Date: 61 / 15 / 2018 Last Medication intake:  Today   Pharmacokinetics: Liberation and absorption (onset of action): WNL Distribution (time to peak effect): WNL Metabolism and excretion (duration of action): WNL         Pharmacodynamics: Desired effects: Analgesia: Lorraine Hutchinson reports >50% benefit. Functional ability: Patient reports that medication allows her to accomplish basic ADLs Clinically meaningful improvement in function (CMIF): Sustained CMIF goals met Perceived effectiveness: Described as relatively effective, allowing for increase in activities of daily living (ADL) Undesirable effects: Side-effects or Adverse reactions: None reported Monitoring: [place] PMP: Online review of the past 54-monthperiod conducted. Compliant with practice rules and regulations Last UDS on record: Summary  Date Value Ref Range Status  09/18/2016 FINAL  Final    Comment:    ==================================================================== TOXAS[facility]RE COMP DRUG ANALYSIS,UR ==================================================================== Test                             Result       Flag       Units Drug Present and Declared for Prescription Verification   Duloxetine                     PRESENT      EXPECTED   Trazodone                      PRESENT      EXPECTED   1,3 chlorophenyl piperazine    PRESENT      EXPECTED    1,3-chlorophenyl piperazine is an expected metabolite of    trazodone.   Dextromethorphan               PRESENT      EXPECTED   Dextrorphan/Levorphanol        PRESENT      EXPECTED    Dextrorphan is an  expected metabolite of dextromethorphan, an    over-the-counter or prescription cough suppressant. Dextrorphan    cannot be distinguished from the scheduled prescription    medication levorphanol by the method used for analysis.   Diltiazem                      PRESENT      EXPECTED   Guaifenesin                    PRESENT      EXPECTED    Guaifenesin may be administered as an over-the-counter or    prescription drug; it may also be present as a breakdown product    of methocarbamol. Drug Present not Declared for Prescription Verification   Gabapentin                     PRESENT      UNEXPECTED   Citalopram                     PRESENT      UNEXPECTED   Desmethylcitalopram            PRESENT      UNEXPECTED    Desmethylcitalopram  is an expected metabolite of citalopram or    the enantiomeric form, escitalopram.   Acetaminophen                  PRESENT      UNEXPECTED Drug Absent but Declared for Prescription Verification   Diclofenac                     Not Detected UNEXPECTED    Diclofenac, as indicated in the declared medication list, is not    always detected even when used as directed. ==================================================================== Test                      Result    Flag   Units      Ref Range   Creatinine              174              mg/dL      >=20 ==================================================================== Declared Medications:  The flagging and interpretation on this report are based on the  following declared medications.  Unexpected results may arise from  inaccuracies in the declared medications.  **Note: The testing scope of this panel includes these medications:  Dextromethorphan  Diltiazem  Duloxetine  Guaifenesin  Trazodone  **Note: The testing scope of this panel does not include small to  moderate amounts of these reported medications:  Diclofenac  **Note: The testing scope of this panel does not include following  reported  medications:  Calcium (Calcium/Vitamin D)  Cholecalciferol  Chromium  Cinnamon Bark  Folic acid  Hydrochlorothiazide (Lisinopril-HCTZ)  Insulin (NovoLog)  Lisinopril (Lisinopril-HCTZ)  Lutein  Melatonin  Metformin  Multivitamin  Omeprazole  Potassium  Pravastatin  Supplement (Lipoic Acid)  Vitamin B  Vitamin B (Biotin)  Vitamin D (Calcium/Vitamin D) ==================================================================== For clinical consultation, please call [phone]. ====================================================================    UDS interpretation: Compliant          Medication Assessment Form: Reviewed. Patient indicates being compliant with therapy Treatment compliance: Compliant Risk Assessment Profile: Aberrant behavior: See prior evaluations. None observed or detected today Comorbid factors increasing risk of overdose: See prior notes. No additional risks detected today Risk of substance use disorder (SUD): Low Opioid Risk Tool - 10/24/16 1023      Family History of Substance Abuse   Alcohol  Negative    Illegal Drugs  Negative    Rx Drugs  Negative      Personal History of Substance Abuse   Alcohol  Negative    Illegal Drugs  Negative    Rx Drugs  Negative      Age   Age between 52-45 years   No      History of Preadolescent Sexual Abuse   History of Preadolescent Sexual Abuse  Negative or Female      Psychological Disease   Psychological Disease  Negative    ADD  Negative    OCD  Negative    Bipolar  Negative    Schizophrenia  Negative    Depression  Positive      Total Score   Opioid Risk Tool Scoring  1    Opioid Risk Interpretation  Low Risk      ORT Scoring interpretation table:  Score <3 = Low Risk for SUD  Score between 4-7 = Moderate Risk for SUD  Score >8 = High Risk for Opioid Abuse   Risk Mitigation Strategies:  Patient Counseling: Covered Patient-Prescriber Agreement (PPA):  Present and active  Notification to other  healthcare providers: Done  Pharmacologic Plan: No change in therapy, at this time  Post-Procedure Assessment  110/01/2016 Procedure: LESI and SI joint  Pre-procedure pain score:  5/10 Post-procedure pain score: 0/10         Influential Factors: BMI: 59.07 kg/m Intra-procedural challenges: None observed.         Assessment challenges: None detected.              Reported side-effects: None.        Post-procedural adverse reactions or complications: None reported         Sedation: Please see nurses note. When no sedatives are used, the analgesic levels obtained are directly associated to the effectiveness of the local anesthetics. However, when sedation is provided, the level of analgesia obtained during the initial 1 hour following the intervention, is believed to be the result of a combination of factors. These factors may include, but are not limited to: 1. The effectiveness of the local anesthetics used. 2. The effects of the analgesic(s) and/or anxiolytic(s) used. 3. The degree of discomfort experienced by the patient at the time of the procedure. 4. The patients ability and reliability in recalling and recording the events. 5. The presence and influence of possible secondary gains and/or psychosocial factors. Reported result: Relief experienced during the 1st hour after the procedure: 0 % (Ultra-Short Term Relief)            Interpretative annotation: Clinically appropriate result. Analgesia during this period is likely to be Local Anesthetic and/or IV Sedative (Analgesic/Anxiolytic) related.          Effects of local anesthetic: The analgesic effects attained during this period are directly associated to the localized infiltration of local anesthetics and therefore cary significant diagnostic value as to the etiological location, or anatomical origin, of the pain. Expected duration of relief is directly dependent on the pharmacodynamics of the local anesthetic used. Long-acting (4-6  hours) anesthetics used.  Reported result: Relief during the next 4 to 6 hour after the procedure: 0 % (Short-Term Relief)            Interpretative annotation: Clinically appropriate result. Analgesia during this period is likely to be Local Anesthetic-related.          Long-term benefit: Defined as the period of time past the expected duration of local anesthetics (1 hour for short-acting and 4-6 hours for long-acting). With the possible exception of prolonged sympathetic blockade from the local anesthetics, benefits during this period are typically attributed to, or associated with, other factors such as analgesic sensory neuropraxia, antiinflammatory effects, or beneficial biochemical changes provided by agents other than the local anesthetics.  Reported result: Extended relief following procedure: 0 % (Long-Term Relief)            Interpretative annotation: Clinically appropriate result. Good relief. No permanent benefit expected. Inflammation plays a part in the etiology to the pain.          Current benefits: Defined as reported results that persistent at this point in time.   Analgesia: 0 %            Function: No benefit ROM: No benefit Interpretative annotation: No benefit.  Results would suggest further treatment needed.          Interpretation: Results would suggest failure of therapy in achieving desired goal(s).                  Plan:  Please  see "Plan of Care" for details.        Laboratory Chemistry  Inflammation Markers (CRP: Acute Phase) (ESR: Chronic Phase) Lab Results  Component Value Date   CRP 7.2 (H) 09/18/2016   ESRSEDRATE 27 09/18/2016                 Renal Function Markers Lab Results  Component Value Date   BUN 19 09/18/2016   CREATININE 1.00 09/18/2016   GFRAA 72 09/18/2016   GFRNONAA 62 09/18/2016                 Hepatic Function Markers Lab Results  Component Value Date   AST 17 09/18/2016   ALT 18 09/18/2016   ALBUMIN 4.3 09/18/2016   ALKPHOS  33 (L) 09/18/2016                 Electrolytes Lab Results  Component Value Date   NA 145 (H) 09/18/2016   K 4.9 09/18/2016   CL 104 09/18/2016   CALCIUM 9.7 09/18/2016   MG 1.4 (L) 09/18/2016                 Neuropathy Markers Lab Results  Component Value Date   VITAMINB12 1,105 09/18/2016                 Bone Pathology Markers Lab Results  Component Value Date   ALKPHOS 33 (L) 09/18/2016   25OHVITD1 33 09/18/2016   25OHVITD2 <1.0 09/18/2016   25OHVITD3 33 09/18/2016   CALCIUM 9.7 09/18/2016                 Rheumatology Markers No results found for: LABURIC, URICUR              Coagulation Parameters No results found for: INR, LABPROT, APTT, PLT, DDIMER               Cardiovascular Markers No results found for: BNP, CKTOTAL, CKMB, TROPONINI, HGB, HCT               CA Markers No results found for: CEA, CA125, LABCA2               Note: Lab results reviewed.  Recent Diagnostic Imaging Results  DG C-Arm 1-60 Min-No Report Fluoroscopy was utilized by the requesting physician.  No radiographic  interpretation.   Complexity Note: Imaging results reviewed. Results shared with Ms. Demma, using [employer].                         Meds   Current Outpatient Medications:  .  Alpha-Lipoic Acid 100 MG CAPS, Take by mouth daily. , Disp: , Rfl:  .  B Complex-Biotin-FA (TH VITAMIN B 50/B-COMPLEX) TABS, Take 1 tablet by mouth daily., Disp: , Rfl:  .  B-D UF III MINI PEN NEEDLES 31G X 5 MM MISC, U UTD 2 XD, Disp: , Rfl: 3 .  busPIRone (BUSPAR) 7.5 MG tablet, Take 7.5 mg by mouth 2 (two) times daily. , Disp: , Rfl:  .  Calcium-Vitamin D 600-200 MG-UNIT tablet, Take 1 tablet by mouth 2 (two) times daily., Disp: , Rfl:  .  Cinnamon 500 MG capsule, Take 2,000 mg by mouth daily. , Disp: , Rfl:  .  Dextromethorphan-Guaifenesin 60-1200 MG 12hr tablet, Take 1 tablet by mouth as needed. , Disp: , Rfl:  .  diclofenac (VOLTAREN) 75 MG EC tablet, Take 75 mg by mouth 2 (two)  times daily., Disp: ,  Rfl:  .  diltiazem (DILACOR XR) 180 MG 24 hr capsule, Take 180 mg by mouth 2 (two) times daily., Disp: , Rfl:  .  DULoxetine (CYMBALTA) 30 MG capsule, Take 30 mg by mouth 2 (two) times daily., Disp: , Rfl:  .  insulin aspart protamine - aspart (NOVOLOG MIX 70/30 FLEXPEN) (70-30) 100 UNIT/ML FlexPen, Inject 60 Units into the skin daily before breakfast. 62 units before supper, Disp: , Rfl:  .  lisinopril-hydrochlorothiazide (PRINZIDE,ZESTORETIC) 20-25 MG tablet, Take 1 tablet by mouth daily., Disp: , Rfl:  .  Magnesium Oxide 500 MG CAPS, Take 1 capsule (500 mg total) by mouth 2 (two) times daily at 8 am and 10 pm., Disp: 90 capsule, Rfl: 0 .  Melatonin 10 MG TABS, Take by mouth every evening., Disp: , Rfl:  .  metFORMIN (GLUCOPHAGE-XR) 500 MG 24 hr tablet, TAKE 3 TABLETS BY MOUTH DAILY WITH DINNER, Disp: , Rfl:  .  Multiple Vitamins-Minerals (ABC PLUS SENIOR ADULTS 50+ PO), Take by mouth., Disp: , Rfl:  .  omeprazole (PRILOSEC) 20 MG capsule, Take 20 mg by mouth daily., Disp: , Rfl:  .  Potassium 99 MG TABS, Take 1 tablet by mouth daily., Disp: , Rfl:  .  pravastatin (PRAVACHOL) 40 MG tablet, Take 40 mg by mouth daily., Disp: , Rfl:  .  traZODone (DESYREL) 50 MG tablet, Take 50 mg by mouth daily., Disp: , Rfl:  .  cyclobenzaprine (FLEXERIL) 10 MG tablet, Take 1 tablet (10 mg total) 3 (three) times daily as needed by mouth for muscle spasms., Disp: 90 tablet, Rfl: 0 .  metFORMIN (GLUCOPHAGE) 1000 MG tablet, Take 1,000 mg by mouth 2 (two) times daily., Disp: , Rfl:  .  oxyCODONE (OXY IR/ROXICODONE) 5 MG immediate release tablet, Take 1 tablet (5 mg total) every 8 (eight) hours as needed by mouth for severe pain., Disp: 90 tablet, Rfl: 0  ROS  Constitutional: Denies any fever or chills Gastrointestinal: No reported hemesis, hematochezia, vomiting, or acute GI distress Musculoskeletal: Denies any acute onset joint swelling, redness, loss of ROM, or weakness Neurological: No  reported episodes of acute onset apraxia, aphasia, dysarthria, agnosia, amnesia, paralysis, loss of coordination, or loss of consciousness  Allergies  Ms. Helms is allergic to codeine.  [place]  Drug: Ms. Stapp  has no drug history on file. Alcohol:  reports that she does not drink alcohol. Tobacco:  reports that she quit smoking about 22 months ago. Her smoking use included cigarettes. She has a 40.00 pack-year smoking history. she has never used smokeless tobacco. Medical:  has a past medical history of Cancer ([place]), Diabetes mellitus without complication ([place]), Hyperlipidemia, Hypertension, Scoliosis, and Sleep apnea. Surgical: Ms. Terris  has a past surgical history that includes Replacement total knee (Right); Appendectomy; Cesarean section; Abdominal hysterectomy; and Tonsillectomy. Family: family history includes Cancer in her mother; Diabetes in her father and mother.  Constitutional Exam  General appearance: Well nourished, well developed, and well hydrated. In no apparent acute distress Vitals:   12/03/16 1522  BP: (!) 157/86  Pulse: (!) 101  Resp: 16  Temp: 98.2 F (36.8 C)  TempSrc: Oral  SpO2: 100%  Weight: (!) 400 lb (181.4 kg)  Height: _0  (1.753 m)   BMI Assessment: Estimated body mass index is 59.07 kg/m as calculated from the following:   Height as of this encounter: _1  (1.753 m).   Weight as of this encounter: 400 lb (181.4 kg).  BMI interpretation table: BMI level Category Range  association with higher incidence of chronic pain  <18 kg/m2 Underweight   18.5-24.9 kg/m2 Ideal body weight   25-29.9 kg/m2 Overweight Increased incidence by 20%  30-34.9 kg/m2 Obese (Class I) Increased incidence by 68%  35-39.9 kg/m2 Severe obesity (Class II) Increased incidence by 136%  >40 kg/m2 Extreme obesity (Class III) Increased incidence by 254%   BMI Readings from Last 4 Encounters:  12/03/16 59.07 kg/m  11/15/16 59.07 kg/m  10/24/16 59.07 kg/m   09/26/16 56.71 kg/m   Wt Readings from Last 4 Encounters:  12/03/16 (!) 400 lb (181.4 kg)  11/15/16 (!) 400 lb (181.4 kg)  10/24/16 (!) 400 lb (181.4 kg)  09/26/16 (!) 384 lb (174.2 kg)  Psych/Mental status: Alert, oriented x 3 (person, place, & time)       Eyes: PERLA Respiratory: No evidence of acute respiratory distress  Cervical Spine Area Exam  Skin & Axial Inspection: No masses, redness, edema, swelling, or associated skin lesions Alignment: Symmetrical Functional ROM: Unrestricted ROM      Stability: No instability detected Muscle Tone/Strength: Functionally intact. No obvious neuro-muscular anomalies detected. Sensory (Neurological): Unimpaired Palpation: No palpable anomalies              Upper Extremity (UE) Exam    Side: Right upper extremity  Side: Left upper extremity  Skin & Extremity Inspection: Skin color, temperature, and hair growth are WNL. No peripheral edema or cyanosis. No masses, redness, swelling, asymmetry, or associated skin lesions. No contractures.  Skin & Extremity Inspection: Skin color, temperature, and hair growth are WNL. No peripheral edema or cyanosis. No masses, redness, swelling, asymmetry, or associated skin lesions. No contractures.  Functional ROM: Unrestricted ROM          Functional ROM: Unrestricted ROM          Muscle Tone/Strength: Functionally intact. No obvious neuro-muscular anomalies detected.  Muscle Tone/Strength: Functionally intact. No obvious neuro-muscular anomalies detected.  Sensory (Neurological): Unimpaired          Sensory (Neurological): Unimpaired          Palpation: No palpable anomalies              Palpation: No palpable anomalies              Specialized Test(s): Deferred         Specialized Test(s): Deferred          Thoracic Spine Area Exam  Skin & Axial Inspection: No masses, redness, or swelling Alignment: Symmetrical Functional ROM: Unrestricted ROM Stability: No instability detected Muscle Tone/Strength:  Functionally intact. No obvious neuro-muscular anomalies detected. Sensory (Neurological): Unimpaired Muscle strength & Tone: No palpable anomalies  Lumbar Spine Area Exam  Skin & Axial Inspection: No masses, redness, or swelling Alignment: Symmetrical Functional ROM: Unrestricted ROM      Stability: No instability detected Muscle Tone/Strength: Functionally intact. No obvious neuro-muscular anomalies detected. Sensory (Neurological): Unimpaired Palpation: No palpable anomalies       Provocative Tests: Lumbar Hyperextension and rotation test: evaluation deferred today       Lumbar Lateral bending test: evaluation deferred today       Patrick's Maneuver: evaluation deferred today                    Gait & Posture Assessment  Ambulation: Unassisted Gait: Relatively normal for age and body habitus Posture: WNL   Lower Extremity Exam    Side: Right lower extremity  Side: Left lower extremity  Skin & Extremity Inspection: Skin  color, temperature, and hair growth are WNL. No peripheral edema or cyanosis. No masses, redness, swelling, asymmetry, or associated skin lesions. No contractures.  Skin & Extremity Inspection: Skin color, temperature, and hair growth are WNL. No peripheral edema or cyanosis. No masses, redness, swelling, asymmetry, or associated skin lesions. No contractures.  Functional ROM: Unrestricted ROM          Functional ROM: Unrestricted ROM          Muscle Tone/Strength: Functionally intact. No obvious neuro-muscular anomalies detected.  Muscle Tone/Strength: Functionally intact. No obvious neuro-muscular anomalies detected.  Sensory (Neurological): Unimpaired  Sensory (Neurological): Unimpaired  Palpation: No palpable anomalies  Palpation: No palpable anomalies   Assessment  Primary Diagnosis & Pertinent Problem List: The primary encounter diagnosis was Chronic low back pain (Primary Area of Pain) (Bilateral) (R>L). Diagnoses of Chronic pain of lower extremity (Secondary  Area of Pain) (R>L), Chronic neck pain (Tertiary Area of Pain)( (R>L), and Chronic pain syndrome were also pertinent to this visit.  Status Diagnosis  Worsening Worsening Controlled 1. Chronic low back pain (Primary Area of Pain) (Bilateral) (R>L)   2. Chronic pain of lower extremity (Secondary Area of Pain) (R>L)   3. Chronic neck pain (Tertiary Area of Pain)( (R>L)   4. Chronic pain syndrome     Problems updated and reviewed during this visit: No problems updated. Plan of Care  Pharmacotherapy (Medications Ordered): Meds ordered this encounter  Medications  . cyclobenzaprine (FLEXERIL) 10 MG tablet    Sig: Take 1 tablet (10 mg total) 3 (three) times daily as needed by mouth for muscle spasms.    Dispense:  90 tablet    Refill:  0    Do not place this medication, or any other prescription from our practice, on "Automatic Refill". Patient may have prescription filled one day early if pharmacy is closed on scheduled refill date.    Order Specific Question:   Supervising Provider    Answer:   Milinda Pointer [phone]  . oxyCODONE (OXY IR/ROXICODONE) 5 MG immediate release tablet    Sig: Take 1 tablet (5 mg total) every 8 (eight) hours as needed by mouth for severe pain.    Dispense:  90 tablet    Refill:  0    Do not place this medication, or any other prescription from our practice, on "Automatic Refill". Patient may have prescription filled one day early if pharmacy is closed on scheduled refill date. Do not fill until: 12/03/2016 To last until: 01/02/2017    Order Specific Question:   Supervising Provider    Answer:   Milinda Pointer [phone]  This SmartLink is deprecated. Use AVSMEDLIST instead to display the medication list for a patient. Medications administered today: Kjerstin P. Langlinais had no medications administered during this visit. Lab-work, procedure(s), and/or referral(s): No orders of the defined types were placed in this encounter.  Imaging and/or  referral(s): None  Interventional management options: Planned, scheduled, and/or pending:   Not at this time secondary to failed procedure   Considering:   Diagnostic right-sided lumbar facet block  Possible right sided lumbar facet RFA  Diagnostic right-sided sacroiliac joint block  Possible right-sided sacroiliac joint RFA  Diagnostic right L2-L3 interlaminar lumbar epidural steroid injection Diagnostic right-sided L2-3 and L3-4 transforaminal epidural steroid injection Diagnostic right-sided intra-articular knee joint injection  Possible series of 5 right-sided intra-articular Hyalgan knee injections  Diagnostic right-sided genicular nerve block  Possible right-sided genicular nerve RFA  Diagnostic right-sided cervical epidural steroid injection  Diagnostic bilateral  cervical facet block  Possible bilateral cervical facet RFA  Diagnostic right-sided intra-articular shoulder joint injection  Diagnostic right-sided suprascapular nerve block  Possible right-sided suprascapular nerve RFA    Palliative PRN treatment(s):   None at this time      Provider-requested follow-up: Return in about 4 weeks (around 12/31/2016) for MedMgmt with Dr Lowella Dandy secondary to interventional treatment not effectitve. .  Future Appointments  Date Time Provider [place]  12/31/2016  8:15 AM Milinda Pointer, MD [facility] None   Primary Care Physician: Wayland Denis, PA-C Location: [facility] Note by: Vevelyn Francois NP Date: 12/03/2016; Time: 1:59 PM  Pain Score Disclaimer: We use the NRS-11 scale. This is a self-reported, subjective measurement of pain severity with only modest accuracy. It is used primarily to identify changes within a particular patient. It must be understood that outpatient pain scales are significantly less accurate that those used for research, where they can be applied under ideal controlled circumstances with minimal exposure  to variables. In reality, the score is likely to be a combination of pain intensity and pain affect, where pain affect describes the degree of emotional arousal or changes in action readiness caused by the sensory experience of pain. Factors such as social and work situation, setting, emotional state, anxiety levels, expectation, and prior pain experience may influence pain perception and show large inter-individual differences that may also be affected by time variables.  Patient instructions provided during this appointment: Patient Instructions    ____________________________________________________________________________________________  Medication Rules  Applies to: All patients receiving prescriptions (written or electronic).  Pharmacy of record: Pharmacy where electronic prescriptions will be sent. If written prescriptions are taken to a different pharmacy, please inform the nursing staff. The pharmacy listed in the electronic medical record should be the one where you would like electronic prescriptions to be sent.  Prescription refills: Only during scheduled appointments. Applies to both, written and electronic prescriptions.  NOTE: The following applies primarily to controlled substances (Opioid* Pain Medications).   Patient's responsibilities: 1. Pain Pills: Bring all pain pills to every appointment (except for procedure appointments). 2. Pill Bottles: Bring pills in original pharmacy bottle. Always bring newest bottle. Bring bottle, even if empty. 3. Medication refills: You are responsible for knowing and keeping track of what medications you need refilled. The day before your appointment, write a list of all prescriptions that need to be refilled. Bring that list to your appointment and give it to the admitting nurse. Prescriptions will be written only during appointments. If you forget a medication, it will not be "Called in", "Faxed", or "electronically sent". You will need to  get another appointment to get these prescribed. 4. Prescription Accuracy: You are responsible for carefully inspecting your prescriptions before leaving our office. Have the discharge nurse carefully go over each prescription with you, before taking them home. Make sure that your name is accurately spelled, that your address is correct. Check the name and dose of your medication to make sure it is accurate. Check the number of pills, and the written instructions to make sure they are clear and accurate. Make sure that you are given enough medication to last until your next medication refill appointment. 5. Taking Medication: Take medication as prescribed. Never take more pills than instructed. Never take medication more frequently than prescribed. Taking less pills or less frequently is permitted and encouraged, when it comes to controlled substances (written prescriptions).  6. Inform other Doctors: Always inform, all of your healthcare providers,  of all the medications you take. 7. Pain Medication from other Providers: You are not allowed to accept any additional pain medication from any other Doctor or Healthcare provider. There are two exceptions to this rule. (see below) In the event that you require additional pain medication, you are responsible for notifying us, as stated below. 8. Medication Agreement: You are responsible for carefully reading and following our Medication Agreement. This must be signed before receiving any prescriptions from our practice. Safely store a copy of your signed Agreement. Violations to the Agreement will result in no further prescriptions. (Additional copies of our Medication Agreement are available upon request.) 9. Laws, Rules, & Regulations: All patients are expected to follow all Federal and [employer], Rules, [employer]. Ignorance of the Laws does not constitute a valid excuse. The use of any illegal substances is prohibited. 10. Adopted CDC guidelines  & recommendations: Target dosing levels will be at or below 60 MME/day. Use of benzodiazepines** is not recommended.  Exceptions: There are only two exceptions to the rule of not receiving pain medications from other Healthcare Providers. 1. Exception #1 (Emergencies): In the event of an emergency (i.e.: accident requiring emergency care), you are allowed to receive additional pain medication. However, you are responsible for: As soon as you are able, call our office [phone], at any time of the day or night, and leave a message stating your name, the date and nature of the emergency, and the name and dose of the medication prescribed. In the event that your call is answered by a member of our staff, make sure to document and save the date, time, and the name of the person that took your information.  2. Exception #2 (Planned Surgery): In the event that you are scheduled by another doctor or dentist to have any type of surgery or procedure, you are allowed (for a period no longer than 30 days), to receive additional pain medication, for the acute post-op pain. However, in this case, you are responsible for picking up a copy of our "Post-op Pain Management for Surgeons" handout, and giving it to your surgeon or dentist. This document is available at our office, and does not require an appointment to obtain it. Simply go to our office during business hours (Monday-Thursday from 8:00 AM to 4:00 PM) (Friday 8:00 AM to 12:00 Noon) or if you have a scheduled appointment with [place], prior to your surgery, and ask for it by name. In addition, you will need to provide [place] with your name, name of your surgeon, type of surgery, and date of procedure or surgery.  *Opioid medications include: morphine, codeine, oxycodone, oxymorphone, hydrocodone, hydromorphone, meperidine, tramadol, tapentadol, buprenorphine, fentanyl, methadone. **Benzodiazepine medications include: diazepam (Valium), alprazolam (Xanax), clonazepam  (Klonopine), lorazepam (Ativan), clorazepate (Tranxene), chlordiazepoxide (Librium), estazolam (Prosom), oxazepam (Serax), temazepam (Restoril), triazolam (Halcion)  ____________________________________________________________________________________________  ____________________________________________________________________________________________  Pain Scale  Introduction: The pain score used by this practice is the Verbal Numerical Rating Scale (VNRS-11). This is an 11-point scale. It is for adults and children 10 years or older. There are significant differences in how the pain score is reported, used, and applied. Forget everything you learned in the past and learn this scoring system.  General Information: The scale should reflect your current level of pain. Unless you are specifically asked for the level of your worst pain, or your average pain. If you are asked for one of these two, then it should be understood that it is over the past 24  hours.  Basic Activities of Daily Living (ADL): Personal hygiene, dressing, eating, transferring, and using restroom.  Instructions: Most patients tend to report their level of pain as a combination of two factors, their physical pain and their psychosocial pain. This last one is also known as "suffering" and it is reflection of how physical pain affects you socially and psychologically. From now on, report them separately. From this point on, when asked to report your pain level, report only your physical pain. Use the following table for reference.  Pain Clinic Pain Levels (0-5/10)  Pain Level Score  Description  No Pain 0   Mild pain 1 Nagging, annoying, but does not interfere with basic activities of daily living (ADL). Patients are able to eat, bathe, get dressed, toileting (being able to get on and off the toilet and perform personal hygiene functions), transfer (move in and out of bed or a chair without assistance), and maintain continence (able  to control bladder and bowel functions). Blood pressure and heart rate are unaffected. A normal heart rate for a healthy adult ranges from 60 to 100 bpm (beats per minute).   Mild to moderate pain 2 Noticeable and distracting. Impossible to hide from other people. More frequent flare-ups. Still possible to adapt and function close to normal. It can be very annoying and may have occasional stronger flare-ups. With discipline, patients may get used to it and adapt.   Moderate pain 3 Interferes significantly with activities of daily living (ADL). It becomes difficult to feed, bathe, get dressed, get on and off the toilet or to perform personal hygiene functions. Difficult to get in and out of bed or a chair without assistance. Very distracting. With effort, it can be ignored when deeply involved in activities.   Moderately severe pain 4 Impossible to ignore for more than a few minutes. With effort, patients may still be able to manage work or participate in some social activities. Very difficult to concentrate. Signs of autonomic nervous system discharge are evident: dilated pupils (mydriasis); mild sweating (diaphoresis); sleep interference. Heart rate becomes elevated (>115 bpm). Diastolic blood pressure (lower number) rises above 100 mmHg. Patients find relief in laying down and not moving.   Severe pain 5 Intense and extremely unpleasant. Associated with frowning face and frequent crying. Pain overwhelms the senses.  Ability to do any activity or maintain social relationships becomes significantly limited. Conversation becomes difficult. Pacing back and forth is common, as getting into a comfortable position is nearly impossible. Pain wakes you up from deep sleep. Physical signs will be obvious: pupillary dilation; increased sweating; goosebumps; brisk reflexes; cold, clammy hands and feet; nausea, vomiting or dry heaves; loss of appetite; significant sleep disturbance with inability to fall asleep or to  remain asleep. When persistent, significant weight loss is observed due to the complete loss of appetite and sleep deprivation.  Blood pressure and heart rate becomes significantly elevated. Caution: If elevated blood pressure triggers a pounding headache associated with blurred vision, then the patient should immediately seek attention at an urgent or emergency care unit, as these may be signs of an impending stroke.    Emergency Department Pain Levels (6-10/10)  Emergency Room Pain 6 Severely limiting. Requires emergency care and should not be seen or managed at an outpatient pain management facility. Communication becomes difficult and requires great effort. Assistance to reach the emergency department may be required. Facial flushing and profuse sweating along with potentially dangerous increases in heart rate and blood pressure will be evident.  Distressing pain 7 Self-care is very difficult. Assistance is required to transport, or use restroom. Assistance to reach the emergency department will be required. Tasks requiring coordination, such as bathing and getting dressed become very difficult.   Disabling pain 8 Self-care is no longer possible. At this level, pain is disabling. The individual is unable to do even the most "basic" activities such as walking, eating, bathing, dressing, transferring to a bed, or toileting. Fine motor skills are lost. It is difficult to think clearly.   Incapacitating pain 9 Pain becomes incapacitating. Thought processing is no longer possible. Difficult to remember your own name. Control of movement and coordination are lost.   The worst pain imaginable 10 At this level, most patients pass out from pain. When this level is reached, collapse of the autonomic nervous system occurs, leading to a sudden drop in blood pressure and heart rate. This in turn results in a temporary and dramatic drop in blood flow to the brain, leading to a loss of consciousness. Fainting is  one of the body's self defense mechanisms. Passing out puts the brain in a calmed state and causes it to shut down for a while, in order to begin the healing process.    Summary: 1. Refer to this scale when providing [place] with your pain level. 2. Be accurate and careful when reporting your pain level. This will help with your care. 3. Over-reporting your pain level will lead to loss of credibility. 4. Even a level of 1/10 means that there is pain and will be treated at our facility. 5. High, inaccurate reporting will be documented as "Symptom Exaggeration", leading to loss of credibility and suspicions of possible secondary gains such as obtaining more narcotics, or wanting to appear disabled, for fraudulent reasons. 6. Only pain levels of 5 or below will be seen at our facility. 7. Pain levels of 6 and above will be sent to the Emergency Department and the appointment cancelled. ____________________________________________________________________________________________

## 2016-12-03 NOTE — Progress Notes (Signed)
Nursing Pain Medication Assessment:  Safety precautions to be maintained throughout the outpatient stay will include: orient to surroundings, keep bed in low position, maintain call bell within reach at all times, provide assistance with transfer out of bed and ambulation.  Medication Inspection Compliance: Pill count conducted under aseptic conditions, in front of the patient. Neither the pills nor the bottle was removed from the patient's sight at any time. Once count was completed pills were immediately returned to the patient in their original bottle.  Medication: Oxycodone IR Pill/Patch Count: 4 of 90 pills remain Pill/Patch Appearance: Markings consistent with prescribed medication Bottle Appearance: Standard pharmacy container. Clearly labeled. Filled Date: 12 / 15 / 2018 Last Medication intake:  Today

## 2016-12-03 NOTE — Patient Instructions (Signed)

## 2016-12-30 DIAGNOSIS — G8929 Other chronic pain: Secondary | ICD-10-CM | POA: Insufficient documentation

## 2016-12-30 DIAGNOSIS — M7918 Myalgia, other site: Secondary | ICD-10-CM

## 2016-12-30 DIAGNOSIS — M792 Neuralgia and neuritis, unspecified: Secondary | ICD-10-CM | POA: Insufficient documentation

## 2016-12-30 NOTE — Progress Notes (Deleted)
Patient's Name: Lorraine Hutchinson  MRN: [id number]  Referring Provider: Wayland Denis, PA-C  DOB: 15-Jun-1960  PCP: Wayland Denis, PA-C  DOS: 12/31/2016  Note by: Gaspar Cola, MD  Service setting: Ambulatory outpatient  Specialty: Interventional Pain Management  Location: [facility] (AMB) Pain Management Facility    Patient type: Established   Primary Reason(s) for Visit: Encounter for prescription drug management. (Level of risk: moderate)  CC: No chief complaint on file.  HPI  [name] is a 56 y.o. year old, female patient, who comes today for a medication management evaluation. She has Chronic depression; COPD, moderate ([place]); Essential hypertension; Former cigarette smoker; History of type 2 diabetes mellitus; Obesity, morbid, BMI 50 or higher ([place]); OSA (obstructive sleep apnea); Vitamin D deficiency, unspecified; Long term current use of opiate analgesic; Long term prescription opiate use; Opiate use; Chronic sacroiliac joint pain (Bilateral) (R>L); Chronic neck pain (Tertiary Area of Pain)( (R>L); Encounter for screening for nutritional disorder; Chronic knee pain (Right); Shoulder impingement syndrome (Right); Lumbar facet osteoarthritis (Bilateral); Lumbar facet syndrome (Bilateral) (R>L); Lumbar facet arthralgia (Bilateral) (R>L); Lumbar facet arthropathy (Bilateral); DDD (degenerative disc disease), lumbar (L2-3); Osteoarthritis of lumbar spine; Chronic pain of lower extremity (Secondary Area of Pain) (R>L); Chronic shoulder pain (Fourth Area of Pain) (R>L); Elevated C-reactive protein (CRP); Hypomagnesemia; Chronic pain syndrome; 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 Her primarily concern today is the No chief complaint on file.  Pain Assessment: Location:     Radiating:   Onset:   Duration:   Quality:   Severity:  /10 (self-reported pain score)  Note: Reported level is compatible with observation.                         When using our objective Pain Scale, levels between 6 and 10/10 are said to belong in an emergency room, as it progressively worsens from a 6/10, described as severely limiting, requiring emergency care not usually available at an outpatient pain management facility. At a 6/10 level, communication becomes difficult and requires great effort. Assistance to reach the emergency department may be required. Facial flushing and profuse sweating along with potentially dangerous increases in heart rate and blood pressure will be evident. Effect on ADL:   Timing:   Modifying factors:    [name] was last scheduled for an appointment on 11/15/2016 for medication management. During today's appointment we reviewed [name]'s chronic pain status, as well as her outpatient medication regimen.  The patient  has no drug history on file. Her body mass index is unknown because there is no height or weight on file.   Note: Today I covered the following topics with  [name]: the appropriate use of the pain scale, [name]'s primary cause of pain, the treatment plan, treatment alternatives, the risks and possible complications of proposed treatment, medication side effects, the results, interpretation and significance of  her recent diagnostic interventional treatment(s), the goals of pain management (increased in functionality), the need to bring and keep the BMI below 30, the importance of providing [place] with accurate post-procedure information and the need to collect and read the AVS material.  Further details on  both, my assessment(s), as well as the  proposed treatment plan, please see below.  Controlled Substance Pharmacotherapy Assessment REMS (Risk Evaluation and Mitigation Strategy)  Analgesic:Oxycodone IR 5 mg 1 tablet by mouth every 8 hours (35m/dayof oxycodone) (22.5MME/day) MME/day:22.541mday.  No notes on file Pharmacokinetics: Liberation and absorption (onset of action): WNL Distribution (time to peak effect): WNL Metabolism and excretion (duration of action): WNL         Pharmacodynamics: Desired effects: Analgesia: [name] >50% benefit. Functional ability: Patient reports that medication allows her to accomplish basic ADLs Clinically meaningful improvement in function (CMIF): Sustained CMIF goals met Perceived effectiveness: Described as relatively effective, allowing for increase in activities of daily living (ADL) Undesirable effects: Side-effects or Adverse reactions: None reported Monitoring:  PMP: Online review of the past 1210-monthriod conducted. Compliant with practice rules and regulations Last UDS on record: Summary  Date Value Ref Range Status  09/18/2016 FINAL  Final    Comment:    ==================================================================== [facility]RE COMP DRUG ANALYSIS,UR ==================================================================== Test                             Result       Flag       Units Drug Present and Declared for Prescription Verification   Duloxetine                     PRESENT      EXPECTED   Trazodone                      PRESENT      EXPECTED   1,3 chlorophenyl piperazine    PRESENT      EXPECTED    1,3-chlorophenyl piperazine is an expected metabolite of    trazodone.   Dextromethorphan               PRESENT      EXPECTED   Dextrorphan/Levorphanol        PRESENT      EXPECTED    Dextrorphan is an expected metabolite of dextromethorphan, an    over-the-counter or prescription cough suppressant. Dextrorphan    cannot be distinguished from the  scheduled prescription    medication levorphanol by the method used for analysis.   Diltiazem                      PRESENT      EXPECTED   Guaifenesin                    PRESENT      EXPECTED    Guaifenesin may be administered as an over-the-counter or    prescription drug; it may also be present as a breakdown product    of methocarbamol. Drug Present not Declared for Prescription Verification   Gabapentin                     PRESENT      UNEXPECTED   Citalopram                     PRESENT      UNEXPECTED   Desmethylcitalopram            PRESENT      UNEXPECTED    Desmethylcitalopram is an expected metabolite of citalopram or    the enantiomeric form, escitalopram.   Acetaminophen  PRESENT      UNEXPECTED Drug Absent but Declared for Prescription Verification   Diclofenac                     Not Detected UNEXPECTED    Diclofenac, as indicated in the declared medication list, is not    always detected even when used as directed. ==================================================================== Test                      Result    Flag   Units      Ref Range   Creatinine              174              mg/dL      >=20 ==================================================================== Declared Medications:  The flagging and interpretation on this report are based on the  following declared medications.  Unexpected results may arise from  inaccuracies in the declared medications.  **Note: The testing scope of this panel includes these medications:  Dextromethorphan  Diltiazem  Duloxetine  Guaifenesin  Trazodone  **Note: The testing scope of this panel does not include small to  moderate amounts of these reported medications:  Diclofenac  **Note: The testing scope of this panel does not include following  reported medications:  Calcium (Calcium/Vitamin D)  Cholecalciferol  Chromium  Cinnamon Bark  Folic acid  Hydrochlorothiazide (Lisinopril-HCTZ)  Insulin  (NovoLog)  Lisinopril (Lisinopril-HCTZ)  Lutein  Melatonin  Metformin  Multivitamin  Omeprazole  Potassium  Pravastatin  Supplement (Lipoic Acid)  Vitamin B  Vitamin B (Biotin)  Vitamin D (Calcium/Vitamin D) ==================================================================== For clinical consultation, please call [phone]. ====================================================================    UDS interpretation: Compliant          Medication Assessment Form: Reviewed. Patient indicates being compliant with therapy Treatment compliance: Compliant Risk Assessment Profile: Aberrant behavior: See prior evaluations. None observed or detected today Comorbid factors increasing risk of overdose: See prior notes. No additional risks detected today Risk of substance use disorder (SUD): Low  ORT Scoring interpretation table:  Score <3 = Low Risk for SUD  Score between 4-7 = Moderate Risk for SUD  Score >8 = High Risk for Opioid Abuse   Risk Mitigation Strategies:  Patient Counseling: Covered Patient-Prescriber Agreement (PPA): Present and active  Notification to other healthcare providers: Done  Pharmacologic Plan: No change in therapy, at this time  Laboratory Chemistry  Inflammation Markers (CRP: Acute Phase) (ESR: Chronic Phase) Lab Results  Component Value Date   CRP 7.2 (H) 09/18/2016   ESRSEDRATE 27 09/18/2016                 Renal Function Markers Lab Results  Component Value Date   BUN 19 09/18/2016   CREATININE 1.00 09/18/2016   GFRAA 72 09/18/2016   GFRNONAA 62 09/18/2016                 Hepatic Function Markers Lab Results  Component Value Date   AST 17 09/18/2016   ALT 18 09/18/2016   ALBUMIN 4.3 09/18/2016   ALKPHOS 33 (L) 09/18/2016                 Electrolytes Lab Results  Component Value Date   NA 145 (H) 09/18/2016   K 4.9 09/18/2016   CL 104 09/18/2016   CALCIUM 9.7 09/18/2016   MG 1.4 (L) 09/18/2016  Neuropathy  Markers Lab Results  Component Value Date   VITAMINB12 1,105 09/18/2016                 Bone Pathology Markers Lab Results  Component Value Date   25OHVITD1 33 09/18/2016   25OHVITD2 <1.0 09/18/2016   25OHVITD3 33 09/18/2016                 Note: Lab results reviewed.  Recent Diagnostic Imaging Results  DG C-Arm 1-60 Min-No Report Fluoroscopy was utilized by the requesting physician.  No radiographic  interpretation.   Complexity Note: Imaging results reviewed. Results shared with Ms. Kotlarz, using [employer].                         Meds   Current Outpatient Medications:  .  Alpha-Lipoic Acid 100 MG CAPS, Take by mouth daily. , Disp: , Rfl:  .  B Complex-Biotin-FA (TH VITAMIN B 50/B-COMPLEX) TABS, Take 1 tablet by mouth daily., Disp: , Rfl:  .  B-D UF III MINI PEN NEEDLES 31G X 5 MM MISC, U UTD 2 XD, Disp: , Rfl: 3 .  busPIRone (BUSPAR) 7.5 MG tablet, Take 7.5 mg by mouth 2 (two) times daily. , Disp: , Rfl:  .  Calcium-Vitamin D 600-200 MG-UNIT tablet, Take 1 tablet by mouth 2 (two) times daily., Disp: , Rfl:  .  Cinnamon 500 MG capsule, Take 2,000 mg by mouth daily. , Disp: , Rfl:  .  cyclobenzaprine (FLEXERIL) 10 MG tablet, Take 1 tablet (10 mg total) 3 (three) times daily as needed by mouth for muscle spasms., Disp: 90 tablet, Rfl: 0 .  Dextromethorphan-Guaifenesin 60-1200 MG 12hr tablet, Take 1 tablet by mouth as needed. , Disp: , Rfl:  .  diclofenac (VOLTAREN) 75 MG EC tablet, Take 75 mg by mouth 2 (two) times daily., Disp: , Rfl:  .  diltiazem (DILACOR XR) 180 MG 24 hr capsule, Take 180 mg by mouth 2 (two) times daily., Disp: , Rfl:  .  DULoxetine (CYMBALTA) 30 MG capsule, Take 30 mg by mouth 2 (two) times daily., Disp: , Rfl:  .  insulin aspart protamine - aspart (NOVOLOG MIX 70/30 FLEXPEN) (70-30) 100 UNIT/ML FlexPen, Inject 60 Units into the skin daily before breakfast. 62 units before supper, Disp: , Rfl:  .  lisinopril-hydrochlorothiazide  (PRINZIDE,ZESTORETIC) 20-25 MG tablet, Take 1 tablet by mouth daily., Disp: , Rfl:  .  Melatonin 10 MG TABS, Take by mouth every evening., Disp: , Rfl:  .  metFORMIN (GLUCOPHAGE) 1000 MG tablet, Take 1,000 mg by mouth 2 (two) times daily., Disp: , Rfl:  .  metFORMIN (GLUCOPHAGE-XR) 500 MG 24 hr tablet, TAKE 3 TABLETS BY MOUTH DAILY WITH DINNER, Disp: , Rfl:  .  Multiple Vitamins-Minerals (ABC PLUS SENIOR ADULTS 50+ PO), Take by mouth., Disp: , Rfl:  .  omeprazole (PRILOSEC) 20 MG capsule, Take 20 mg by mouth daily., Disp: , Rfl:  .  oxyCODONE (OXY IR/ROXICODONE) 5 MG immediate release tablet, Take 1 tablet (5 mg total) every 8 (eight) hours as needed by mouth for severe pain., Disp: 90 tablet, Rfl: 0 .  Potassium 99 MG TABS, Take 1 tablet by mouth daily., Disp: , Rfl:  .  pravastatin (PRAVACHOL) 40 MG tablet, Take 40 mg by mouth daily., Disp: , Rfl:  .  traZODone (DESYREL) 50 MG tablet, Take 50 mg by mouth daily., Disp: , Rfl:   ROS  Constitutional: Denies any fever or chills  Gastrointestinal: No reported hemesis, hematochezia, vomiting, or acute GI distress Musculoskeletal: Denies any acute onset joint swelling, redness, loss of ROM, or weakness Neurological: No reported episodes of acute onset apraxia, aphasia, dysarthria, agnosia, amnesia, paralysis, loss of coordination, or loss of consciousness  Allergies  Ms. Fitzgibbon is allergic to codeine.  [place]  Drug: Ms. Banwart  has no drug history on file. Alcohol:  reports that she does not drink alcohol. Tobacco:  reports that she quit smoking about 1 years ago. Her smoking use included cigarettes. She has a 40.00 pack-year smoking history. she has never used smokeless tobacco. Medical:  has a past medical history of Cancer ([place]), Diabetes mellitus without complication ([place]), Hyperlipidemia, Hypertension, Scoliosis, and Sleep apnea. Surgical: Ms. Sokolow  has a past surgical history that includes Replacement total knee (Right);  Appendectomy; Cesarean section; Abdominal hysterectomy; and Tonsillectomy. Family: family history includes Cancer in her mother; Diabetes in her father and mother.  Constitutional Exam  General appearance: Well nourished, well developed, and well hydrated. In no apparent acute distress There were no vitals filed for this visit. BMI Assessment: Estimated body mass index is 59.07 kg/m as calculated from the following:   Height as of 12/03/16: _0  (1.753 m).   Weight as of 12/03/16: 400 lb (181.4 kg).  BMI interpretation table: BMI level Category Range association with higher incidence of chronic pain  <18 kg/m2 Underweight   18.5-24.9 kg/m2 Ideal body weight   25-29.9 kg/m2 Overweight Increased incidence by 20%  30-34.9 kg/m2 Obese (Class I) Increased incidence by 68%  35-39.9 kg/m2 Severe obesity (Class II) Increased incidence by 136%  >40 kg/m2 Extreme obesity (Class III) Increased incidence by 254%   BMI Readings from Last 4 Encounters:  12/03/16 59.07 kg/m  11/15/16 59.07 kg/m  10/24/16 59.07 kg/m  09/26/16 56.71 kg/m   Wt Readings from Last 4 Encounters:  12/03/16 (!) 400 lb (181.4 kg)  11/15/16 (!) 400 lb (181.4 kg)  10/24/16 (!) 400 lb (181.4 kg)  09/26/16 (!) 384 lb (174.2 kg)  Psych/Mental status: Alert, oriented x 3 (person, place, & time)       Eyes: PERLA Respiratory: No evidence of acute respiratory distress  Cervical Spine Area Exam  Skin & Axial Inspection: No masses, redness, edema, swelling, or associated skin lesions Alignment: Symmetrical Functional ROM: Unrestricted ROM      Stability: No instability detected Muscle Tone/Strength: Functionally intact. No obvious neuro-muscular anomalies detected. Sensory (Neurological): Unimpaired Palpation: No palpable anomalies              Upper Extremity (UE) Exam    Side: Right upper extremity  Side: Left upper extremity  Skin & Extremity Inspection: Skin color, temperature, and hair growth are WNL. No  peripheral edema or cyanosis. No masses, redness, swelling, asymmetry, or associated skin lesions. No contractures.  Skin & Extremity Inspection: Skin color, temperature, and hair growth are WNL. No peripheral edema or cyanosis. No masses, redness, swelling, asymmetry, or associated skin lesions. No contractures.  Functional ROM: Unrestricted ROM          Functional ROM: Unrestricted ROM          Muscle Tone/Strength: Functionally intact. No obvious neuro-muscular anomalies detected.  Muscle Tone/Strength: Functionally intact. No obvious neuro-muscular anomalies detected.  Sensory (Neurological): Unimpaired          Sensory (Neurological): Unimpaired          Palpation: No palpable anomalies  Palpation: No palpable anomalies              Specialized Test(s): Deferred         Specialized Test(s): Deferred          Thoracic Spine Area Exam  Skin & Axial Inspection: No masses, redness, or swelling Alignment: Symmetrical Functional ROM: Unrestricted ROM Stability: No instability detected Muscle Tone/Strength: Functionally intact. No obvious neuro-muscular anomalies detected. Sensory (Neurological): Unimpaired Muscle strength & Tone: No palpable anomalies  Lumbar Spine Area Exam  Skin & Axial Inspection: No masses, redness, or swelling Alignment: Symmetrical Functional ROM: Limited ROM      Stability: No instability detected Muscle Tone/Strength: Functionally intact. No obvious neuro-muscular anomalies detected. Sensory (Neurological): Movement-associated pain Palpation: Complains of area being tender to palpation       Provocative Tests: Lumbar Hyperextension and rotation test: Positive bilaterally for facet joint pain. Lumbar Lateral bending test: evaluation deferred today       Patrick's Maneuver: evaluation deferred today                    Gait & Posture Assessment  Ambulation: Unassisted Gait: Relatively normal for age and body habitus Posture: WNL   Lower Extremity  Exam    Side: Right lower extremity  Side: Left lower extremity  Skin & Extremity Inspection: Skin color, temperature, and hair growth are WNL. No peripheral edema or cyanosis. No masses, redness, swelling, asymmetry, or associated skin lesions. No contractures.  Skin & Extremity Inspection: Skin color, temperature, and hair growth are WNL. No peripheral edema or cyanosis. No masses, redness, swelling, asymmetry, or associated skin lesions. No contractures.  Functional ROM: Unrestricted ROM          Functional ROM: Unrestricted ROM          Muscle Tone/Strength: Functionally intact. No obvious neuro-muscular anomalies detected.  Muscle Tone/Strength: Functionally intact. No obvious neuro-muscular anomalies detected.  Sensory (Neurological): Unimpaired  Sensory (Neurological): Unimpaired  Palpation: No palpable anomalies  Palpation: No palpable anomalies   Assessment  Primary Diagnosis & Pertinent Problem List: The primary encounter diagnosis was Chronic low back pain (Primary Area of Pain) (Bilateral) (R>L). 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  Status Diagnosis  Worsening Stable Stable 1. Chronic low back pain (Primary Area of Pain) (Bilateral) (R>L)   2. 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 DDD (degenerative disc disease), lumbar (L2-3)   4. Chronic sacroiliac joint pain (Bilateral) (R>L)   5. Osteoarthritis of lumbar spine   6. Lumbar facet arthralgia (Bilateral) (R>L)   7. Lumbar facet syndrome (Bilateral) (R>L)   8. Abnormal MRI,  lumbar spine (10/21/2016)   9. Chronic pain syndrome   10. Neurogenic pain   11. Chronic musculoskeletal pain   12. Hypomagnesemia   13. Obesity, morbid, BMI 50 or higher ([place])     Problems updated and reviewed during this visit: Problem  Neurogenic Pain  Chronic Musculoskeletal Pain  Osteoarthritis of lumbar spine   Plan of Care  Pharmacotherapy (Medications Ordered): No orders of the defined types were placed in this encounter.  Medications administered today: Taylee P. Shahin had no medications administered during  this visit.  Procedure Orders    No procedure(s) ordered today   Lab Orders  No laboratory test(s) ordered today   Imaging Orders  No imaging studies ordered today   Referral Orders  No referral(s) requested today    Interventional management options: Planned, scheduled, and/or pending:   Gabapentin 100 mg HS Trial. Baclofen 10 mg TID PRN Trial. Diagnostic right-sided L2-3 and L3-4 transforaminal epidural steroid injection, as soon as the patient brings her BMI below 35. No more procedures until then. Nutritionist and Bariatric Surgery referral to bring BMI down.   Considering:   Diagnostic right-sided lumbar facet block Possible right sided lumbar facet RFA Diagnostic right-sided sacroiliac joint block Possible right-sided sacroiliac joint RFA Diagnostic right L2-L3 interlaminar lumbar epidural steroid injection  Diagnostic right-sided L2-3 and L3-4 transforaminal epidural steroid injection  Diagnostic right-sided intra-articular knee joint injection Possible series of 5 right-sided intra-articular Hyalgan knee injections Diagnostic right-sided genicular nerve block Possible right-sided genicular nerve RFA Diagnostic right-sided cervical epidural steroid injection Diagnostic bilateral cervical facet block Possible bilateral cervical facet RFA Diagnostic right-sided intra-articular shoulder joint injection Diagnostic right-sided  suprascapular nerve block Possible right-sided suprascapular nerve RFA   Palliative PRN treatment(s):   None at this time   Provider-requested follow-up: No Follow-up on file.  Future Appointments  Date Time Provider [place]  12/31/2016  8:15 AM Milinda Pointer, MD [facility] None   Primary Care Physician: Wayland Denis, PA-C Location: [facility] Note by: Gaspar Cola, MD Date: 12/31/2016; Time: 11:39 PM

## 2016-12-31 ENCOUNTER — Ambulatory Visit: Payer: BLUE CROSS/BLUE SHIELD | Admitting: Pain Medicine

## 2017-01-09 ENCOUNTER — Other Ambulatory Visit: Payer: Self-pay | Admitting: Pain Medicine

## 2017-03-23 ENCOUNTER — Emergency Department: Payer: BLUE CROSS/BLUE SHIELD

## 2017-03-23 ENCOUNTER — Encounter: Payer: Self-pay | Admitting: Emergency Medicine

## 2017-03-23 ENCOUNTER — Emergency Department
Admission: EM | Admit: 2017-03-23 | Discharge: 2017-03-23 | Disposition: A | Discharge status: Home / Self Care | Payer: BLUE CROSS/BLUE SHIELD | Attending: Emergency Medicine | Admitting: Emergency Medicine

## 2017-03-23 ENCOUNTER — Other Ambulatory Visit: Payer: Self-pay

## 2017-03-23 DIAGNOSIS — M549 Dorsalgia, unspecified: Secondary | ICD-10-CM

## 2017-03-23 DIAGNOSIS — Z87891 Personal history of nicotine dependence: Secondary | ICD-10-CM | POA: Diagnosis not present

## 2017-03-23 DIAGNOSIS — E119 Type 2 diabetes mellitus without complications: Secondary | ICD-10-CM | POA: Insufficient documentation

## 2017-03-23 DIAGNOSIS — Y998 Other external cause status: Secondary | ICD-10-CM | POA: Insufficient documentation

## 2017-03-23 DIAGNOSIS — Y92000 Kitchen of unspecified non-institutional (private) residence as  the place of occurrence of the external cause: Secondary | ICD-10-CM | POA: Insufficient documentation

## 2017-03-23 DIAGNOSIS — E785 Hyperlipidemia, unspecified: Secondary | ICD-10-CM | POA: Diagnosis not present

## 2017-03-23 DIAGNOSIS — W010XXA Fall on same level from slipping, tripping and stumbling without subsequent striking against object, initial encounter: Secondary | ICD-10-CM | POA: Insufficient documentation

## 2017-03-23 DIAGNOSIS — N390 Urinary tract infection, site not specified: Secondary | ICD-10-CM | POA: Insufficient documentation

## 2017-03-23 DIAGNOSIS — Z794 Long term (current) use of insulin: Secondary | ICD-10-CM | POA: Diagnosis not present

## 2017-03-23 DIAGNOSIS — Z79899 Other long term (current) drug therapy: Secondary | ICD-10-CM | POA: Insufficient documentation

## 2017-03-23 DIAGNOSIS — Y939 Activity, unspecified: Secondary | ICD-10-CM | POA: Insufficient documentation

## 2017-03-23 DIAGNOSIS — I10 Essential (primary) hypertension: Secondary | ICD-10-CM | POA: Insufficient documentation

## 2017-03-23 DIAGNOSIS — W19XXXA Unspecified fall, initial encounter: Secondary | ICD-10-CM

## 2017-03-23 DIAGNOSIS — M5489 Other dorsalgia: Secondary | ICD-10-CM | POA: Diagnosis not present

## 2017-03-23 LAB — URINALYSIS, COMPLETE (UACMP) WITH MICROSCOPIC
BILIRUBIN URINE: NEGATIVE
Glucose, UA: NEGATIVE mg/dL
Hgb urine dipstick: NEGATIVE
Ketones, ur: NEGATIVE mg/dL
NITRITE: POSITIVE — AB
PROTEIN: NEGATIVE mg/dL
Specific Gravity, Urine: 1.016 (ref 1.005–1.030)
pH: 5 (ref 5.0–8.0)

## 2017-03-23 MED ORDER — CYCLOBENZAPRINE HCL 10 MG PO TABS
ORAL_TABLET | ORAL | Status: AC
  Filled 2017-03-23: qty 1

## 2017-03-23 MED ORDER — CYCLOBENZAPRINE HCL 10 MG PO TABS: 10.0000 mg | ORAL_TABLET | Freq: Three times a day (TID) | ORAL | 0 refills | Status: DC | PRN

## 2017-03-23 MED ORDER — CEFTRIAXONE SODIUM 1 G IJ SOLR
INTRAMUSCULAR | Status: AC
  Administered 2017-03-23: 1 g via INTRAMUSCULAR
  Filled 2017-03-23: qty 10

## 2017-03-23 MED ORDER — CEPHALEXIN 500 MG PO CAPS: 500.0000 mg | ORAL_CAPSULE | Freq: Three times a day (TID) | ORAL | 0 refills | Status: DC

## 2017-03-23 MED ORDER — PHENAZOPYRIDINE HCL 200 MG PO TABS: 200.0000 mg | ORAL_TABLET | Freq: Three times a day (TID) | ORAL | 0 refills | Status: DC | PRN

## 2017-03-23 MED ORDER — CYCLOBENZAPRINE HCL 10 MG PO TABS
10.0000 mg | ORAL_TABLET | Freq: Once | ORAL | Status: AC
  Administered 2017-03-23: 10 mg via ORAL

## 2017-03-23 MED ORDER — FENTANYL CITRATE (PF) 100 MCG/2ML IJ SOLN
100.0000 ug | Freq: Once | INTRAMUSCULAR | Status: AC
  Administered 2017-03-23: 100 ug via INTRAMUSCULAR
  Filled 2017-03-23: qty 2

## 2017-03-23 NOTE — ED Notes (Signed)
Pt to be discharged - now c/o uti symptoms.

## 2017-03-23 NOTE — ED Provider Notes (Signed)
[facility] Emergency Department Provider Note   ____________________________________________   I have reviewed the triage vital signs and the nursing notes.   HISTORY  Chief Complaint Fall and Back Pain   History limited by: Not Limited   HPI Lorraine Hutchinson is a 57 y.o. female who presents to the emergency department today because of concerns for back pain after a fall.  The patient was in her kitchen and there was water on the floor.  Her feet went opposite directions and she fell onto her back.  Having more pain in the middle of her back in around the right buttock area.  She initially had some pain radiating down her leg but denies any current pain that radiates down her leg.  Denies any weakness or numbness in her legs.  She did not have any chest pain shortness of breath or palpitations.  No recent illness.  Per medical record review patient has a history of DM, scoliosis  Past Medical History:  Diagnosis Date  . Cancer ([place])    cervical  . Diabetes mellitus without complication ([place])   . Hyperlipidemia   . Hypertension   . Scoliosis   . Sleep apnea     Patient Active Problem List   Diagnosis Date Noted  . Neurogenic pain 12/30/2016  . Chronic musculoskeletal pain 12/30/2016  . Groin pain (Right) 11/15/2016  . Abnormal MRI, lumbar spine (10/21/2016) 10/24/2016  . 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 Pain medication agreement signed 10/05/2016  . Generalized anxiety disorder 10/03/2016  . Hyperlipidemia, mixed 10/03/2016  . Type 2 diabetes mellitus with peripheral neuropathy ([place]) 10/03/2016  . Chronic low back pain (Primary Area of Pain) (Bilateral) (R>L) 09/27/2016  . Chronic lumbar radicular pain (Right) (L4 Dermatome) 09/26/2016  . Shoulder impingement syndrome (Right) 09/25/2016  . Lumbar facet osteoarthritis (Bilateral) 09/25/2016  . Lumbar facet syndrome (Bilateral) (R>L) 09/25/2016  . Lumbar facet  arthralgia (Bilateral) (R>L) 09/25/2016  . Lumbar facet arthropathy (Bilateral) 09/25/2016  . DDD (degenerative disc disease), lumbar (L2-3) 09/25/2016  . Osteoarthritis of lumbar spine 09/25/2016  . Chronic pain of lower extremity (Secondary Area of Pain) (R>L) 09/25/2016  . Chronic shoulder pain (Fourth Area of Pain) (R>L) 09/25/2016  . Elevated C-reactive protein (CRP) 09/25/2016  . Hypomagnesemia 09/25/2016  . Chronic pain syndrome 09/25/2016  . Long term current use of opiate analgesic 09/18/2016  . Long term prescription opiate use 09/18/2016  . Opiate use 09/18/2016  . Chronic sacroiliac joint pain (Bilateral) (R>L) 09/18/2016  . Chronic neck pain [facility] Area of Pain)( (R>L) 09/18/2016  . Encounter for screening for nutritional disorder 09/18/2016  . Chronic knee pain (Right) 09/18/2016  . OSA (obstructive sleep apnea) 05/17/2015  . Vitamin D deficiency, unspecified 05/17/2015  . Chronic depression 05/03/2015  . COPD, moderate ([place]) 05/03/2015  . Essential hypertension 05/03/2015  . Former cigarette smoker 05/03/2015  . History of type 2 diabetes mellitus 05/03/2015  . Obesity, morbid, BMI 50 or higher ([place]) 05/03/2015    Past Surgical History:  Procedure Laterality Date  . ABDOMINAL HYSTERECTOMY    . APPENDECTOMY    . CESAREAN SECTION    . REPLACEMENT TOTAL KNEE Right   . TONSILLECTOMY      Prior to Admission medications   Medication Sig Start Date End Date Taking? Authorizing Provider  Alpha-Lipoic Acid 100 MG CAPS Take by mouth daily.     [provider]  B Complex-Biotin-FA (TH VITAMIN B 50/B-COMPLEX) TABS Take 1  tablet by mouth daily.    [provider]  B-D UF III MINI PEN NEEDLES 31G X 5 MM MISC U UTD 2 XD 09/04/16   [provider]  busPIRone (BUSPAR) 7.5 MG tablet Take 7.5 mg by mouth 2 (two) times daily.  10/03/16 10/03/17  [provider]  Calcium-Vitamin D 600-200 MG-UNIT tablet Take 1 tablet by mouth 2 (two) times daily.     [provider]  Cinnamon 500 MG capsule Take 2,000 mg by mouth daily.     [provider]  Dextromethorphan-Guaifenesin 60-1200 MG 12hr tablet Take 1 tablet by mouth as needed.     [provider]  diclofenac (VOLTAREN) 75 MG EC tablet Take 75 mg by mouth 2 (two) times daily.    [provider]  diltiazem (DILACOR XR) 180 MG 24 hr capsule Take 180 mg by mouth 2 (two) times daily.    [provider]  DULoxetine (CYMBALTA) 30 MG capsule Take 30 mg by mouth 2 (two) times daily.    [provider]  insulin aspart protamine - aspart (NOVOLOG MIX 70/30 FLEXPEN) (70-30) 100 UNIT/ML FlexPen Inject 60 Units into the skin daily before breakfast. 62 units before supper 06/21/15   [provider]  lisinopril-hydrochlorothiazide (PRINZIDE,ZESTORETIC) 20-25 MG tablet Take 1 tablet by mouth daily. 04/13/15   [provider]  Melatonin 10 MG TABS Take by mouth every evening.    [provider]  metFORMIN (GLUCOPHAGE) 1000 MG tablet Take 1,000 mg by mouth 2 (two) times daily. 06/21/15 06/20/16  [provider]  metFORMIN (GLUCOPHAGE-XR) 500 MG 24 hr tablet TAKE 3 TABLETS BY MOUTH DAILY WITH DINNER 09/06/16   [provider]  Multiple Vitamins-Minerals (ABC PLUS SENIOR ADULTS 50+ PO) Take by mouth.    [provider]  omeprazole (PRILOSEC) 20 MG capsule Take 20 mg by mouth daily. 04/13/15   [provider]  oxyCODONE (OXY IR/ROXICODONE) 5 MG immediate release tablet Take 1 tablet (5 mg total) every 8 (eight) hours as needed by mouth for severe pain. 12/03/16 01/02/17  Vevelyn Francois, NP  Potassium 99 MG TABS Take 1 tablet by mouth daily.    [provider]  pravastatin (PRAVACHOL) 40 MG tablet Take 40 mg by mouth daily.    [provider]  traZODone (DESYREL) 50 MG tablet Take 50 mg by mouth daily. 06/01/15   [provider]    Allergies Codeine  Family History  Problem  Relation Age of Onset  . Diabetes Father   . Diabetes Mother   . Cancer Mother     Social History Social History   Tobacco Use  . Smoking status: Former Smoker    Packs/day: 1.00    Years: 40.00    Pack years: 40.00    Types: Cigarettes    Last attempt to quit: 01/16/2015    Years since quitting: 2.1  . Smokeless tobacco: Never Used  Substance Use Topics  . Alcohol use: No    Alcohol/week: 0.0 oz  . Drug use: Not on file    Review of Systems Constitutional: No fever/chills Eyes: No visual changes. ENT: No sore throat. Cardiovascular: Denies chest pain. Respiratory: Denies shortness of breath. Gastrointestinal: No abdominal pain.  No nausea, no vomiting.  No diarrhea.   Genitourinary: Negative for dysuria. Musculoskeletal: Positive for back pain. Skin: Negative for rash. Neurological: Negative for headaches, focal weakness or numbness.  ____________________________________________   PHYSICAL EXAM:  VITAL SIGNS: ED Triage Vitals  Enc Vitals  Group     BP 03/23/17 1656 121/75     Pulse Rate 03/23/17 1620 99     Resp 03/23/17 1620 (!) 22     Temp 03/23/17 1620 97.7 F (36.5 C)     Temp src --      SpO2 03/23/17 1620 95 %     Weight 03/23/17 1615 (!) 400 lb (181.4 kg)     Height 03/23/17 1615 5\' 9"  (1.753 m)     Head Circumference --      Peak Flow --      Pain Score 03/23/17 1614 7   Constitutional: Alert and oriented. Appears uncomfortable.  Eyes: Conjunctivae are normal.  ENT   Head: Normocephalic and atraumatic.   Nose: No congestion/rhinnorhea.   Mouth/Throat: Mucous membranes are moist.   Neck: No stridor. Hematological/Lymphatic/Immunilogical: No cervical lymphadenopathy. Cardiovascular: Normal rate, regular rhythm.  No murmurs, rubs, or gallops.  Respiratory: Normal respiratory effort without tachypnea nor retractions. Breath sounds are clear and equal bilaterally. No wheezes/rales/rhonchi. Gastrointestinal: Soft and non tender. No  rebound. No guarding.  Genitourinary: Deferred Musculoskeletal: Normal range of motion in all extremities. No lower extremity edema. Neurologic:  Normal speech and language. No gross focal neurologic deficits are appreciated.  Skin:  Skin is warm, dry and intact. No rash noted. Psychiatric: Mood and affect are normal. Speech and behavior are normal. Patient exhibits appropriate insight and judgment.  ____________________________________________    LABS (pertinent positives/negatives)  UA positive nitrite, large leukocytes, too numerous to count WBC  ____________________________________________   EKG  None  ____________________________________________    RADIOLOGY  Lumbar and Thoracic spine x-ray No acute osseous injury  ____________________________________________   PROCEDURES  Procedures  ____________________________________________   INITIAL IMPRESSION / ASSESSMENT AND PLAN / ED COURSE  Pertinent labs & imaging results that were available during my care of the patient were reviewed by me and considered in my medical decision making (see chart for details).  Patient presented to the emergency department today because of concerns for back pain after a mechanical fall.  X-rays of the lumbar and thoracic spine were negative.  As has getting ready to discharge the patient daughter also then complained that her urine had been foul-smelling.  Did check UA which is consistent with infection. Will give dose of IM abx in the emergency department. Will discharge with prescription for abx. Discussed finding and plan with patient.   ____________________________________________   FINAL CLINICAL IMPRESSION(S) / ED DIAGNOSES  Final diagnoses:  Fall, initial encounter  Back pain, unspecified back location, unspecified back pain laterality, unspecified chronicity  Lower urinary tract infectious disease     Note: This dictation was prepared with Dragon dictation. Any  transcriptional errors that result from this process are unintentional     Nance Pear, MD 03/24/17 [phone]

## 2017-03-23 NOTE — Discharge Instructions (Addendum)
Please seek medical attention for any high fevers, chest pain, shortness of breath, change in behavior, persistent vomiting, bloody stool or any other new or concerning symptoms.  

## 2017-03-23 NOTE — ED Notes (Signed)
As pt signed discharge she noticed one of her lens missing from glasses. Checked the pt's chair and bed, went through all the sheets. Took pt out to car and she checked the pt's medication bag as that is where the glasses originally were. Daughter came back in with nurse and we both searched again = no lense found. Pt unsure if lens was there originally. Daughter will check at home as well.

## 2017-03-23 NOTE — ED Notes (Signed)
Back from xr

## 2017-03-23 NOTE — ED Triage Notes (Signed)
Fall - slipped in water, fell on back and has scapula pain

## 2017-04-16 ENCOUNTER — Ambulatory Visit: Payer: BLUE CROSS/BLUE SHIELD | Admitting: Psychiatry

## 2017-04-16 ENCOUNTER — Other Ambulatory Visit: Payer: Self-pay

## 2017-04-16 ENCOUNTER — Encounter: Payer: Self-pay | Admitting: Psychiatry

## 2017-04-16 VITALS — BP 156/89 | HR 125 | Temp 97.8°F | Wt >= 6400 oz

## 2017-04-16 DIAGNOSIS — G4701 Insomnia due to medical condition: Secondary | ICD-10-CM | POA: Diagnosis not present

## 2017-04-16 DIAGNOSIS — F331 Major depressive disorder, recurrent, moderate: Secondary | ICD-10-CM

## 2017-04-16 DIAGNOSIS — F411 Generalized anxiety disorder: Secondary | ICD-10-CM

## 2017-04-16 MED ORDER — TRAZODONE HCL 100 MG PO TABS: 100.0000 mg | ORAL_TABLET | Freq: Every day | ORAL | 1 refills | Status: DC

## 2017-04-16 MED ORDER — DULOXETINE HCL 30 MG PO CPEP: 30.0000 mg | ORAL_CAPSULE | Freq: Every day | ORAL | 2 refills | Status: DC

## 2017-04-16 MED ORDER — DULOXETINE HCL 60 MG PO CPEP: 60.0000 mg | ORAL_CAPSULE | Freq: Every day | ORAL | 2 refills | Status: DC

## 2017-04-16 MED ORDER — ARIPIPRAZOLE 2 MG PO TABS: 2.0000 mg | ORAL_TABLET | Freq: Every day | ORAL | 1 refills | Status: DC

## 2017-04-16 MED ORDER — HYDROXYZINE PAMOATE 25 MG PO CAPS: 25.0000 mg | ORAL_CAPSULE | Freq: Three times a day (TID) | ORAL | 1 refills | Status: DC | PRN
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
## 2017-04-16 NOTE — Patient Instructions (Signed)
Aripiprazole tablets What is this medicine? ARIPIPRAZOLE (ay ri PIP ray zole) is an atypical antipsychotic. It is used to treat schizophrenia and bipolar disorder, also known as manic-depression. It is also used to treat Tourette's disorder and some symptoms of autism. This medicine may also be used in combination with antidepressants to treat major depressive disorder. This medicine may be used for other purposes; ask your health care provider or pharmacist if you have questions. COMMON BRAND NAME(S): Abilify What should I tell my health care provider before I take this medicine? They need to know if you have any of these conditions: -dehydration -dementia -diabetes -heart disease -history of stroke -low blood counts, like low white cell, platelet, or red cell counts -Parkinson's disease -seizures -suicidal thoughts, plans, or attempt; a previous suicide attempt by you or a family member -an unusual or allergic reaction to aripiprazole, other medicines, foods, dyes, or preservatives -pregnant or trying to get pregnant -breast-feeding How should I use this medicine? Take this medicine by mouth with a glass of water. Follow the directions on the prescription label. You can take this medicine with or without food. Take your doses at regular intervals. Do not take your medicine more often than directed. Do not stop taking except on the advice of your doctor or health care professional. A special MedGuide will be given to you by the pharmacist with each prescription and refill. Be sure to read this information carefully each time. Talk to your pediatrician regarding the use of this medicine in children. While this drug may be prescribed for children as young as 6 years of age for selected conditions, precautions do apply. Overdosage: If you think you have taken too much of this medicine contact a poison control center or emergency room at once. NOTE: This medicine is only for you. Do not share  this medicine with others. What if I miss a dose? If you miss a dose, take it as soon as you can. If it is almost time for your next dose, take only that dose. Do not take double or extra doses. What may interact with this medicine? Do not take this medicine with any of the following medications: -brexpiprazole -cisapride -dofetilide -dronedarone -metoclopramide -pimozide -thioridazine This medicine may also interact with the following medications: -alcohol -carbamazepine -certain medicines for anxiety or sleep -certain medicines for blood pressure -certain medicines for fungal infections like ketoconazole, fluconazole, posaconazole, and itraconazole -clarithromycin -fluoxetine -other medicines that prolong the QT interval (cause an abnormal heart rhythm) -paroxetine -quinidine -rifampin This list may not describe all possible interactions. Give your health care provider a list of all the medicines, herbs, non-prescription drugs, or dietary supplements you use. Also tell them if you smoke, drink alcohol, or use illegal drugs. Some items may interact with your medicine. What should I watch for while using this medicine? Visit your doctor or health care professional for regular checks on your progress. It may be several weeks before you see the full effects of this medicine. Do not suddenly stop taking this medicine. You may need to gradually reduce the dose. Patients and their families should watch out for worsening depression or thoughts of suicide. Also watch out for sudden changes in feelings such as feeling anxious, agitated, panicky, irritable, hostile, aggressive, impulsive, severely restless, overly excited and hyperactive, or not being able to sleep. If this happens, especially at the beginning of antidepressant treatment or after a change in dose, call your health care professional. You may get dizzy or drowsy. Do   not drive, use machinery, or do anything that needs mental  alertness until you know how this medicine affects you. Do not stand or sit up quickly, especially if you are an older patient. This reduces the risk of dizzy or fainting spells. Alcohol can increase dizziness and drowsiness. Avoid alcoholic drinks. This medicine can reduce the response of your body to heat or cold. Dress warm in cold weather and stay hydrated in hot weather. If possible, avoid extreme temperatures like saunas, hot tubs, very hot or cold showers, or activities that can cause dehydration such as vigorous exercise. This medicine may cause dry eyes and blurred vision. If you wear contact lenses you may feel some discomfort. Lubricating drops may help. See your eye doctor if the problem does not go away or is severe. If you notice an increased hunger or thirst, different from your normal hunger or thirst, or if you find that you have to urinate more frequently, you should contact your health care provider as soon as possible. You may need to have your blood sugar monitored. This medicine may cause changes in your blood sugar levels. You should monitor you blood sugar frequently if you have diabetes. There have been reports of uncontrollable and strong urges to gamble, binge eat, shop, and have sex while taking this medicine. If you experience any of these or other uncontrollable and strong urges while taking this medicine, you should report it to your health care provider as soon as possible. What side effects may I notice from receiving this medicine? Side effects that you should report to your doctor or health care professional as soon as possible: -allergic reactions like skin rash, itching or hives, swelling of the face, lips, or tongue -breathing problems -confusion -feeling faint or lightheaded, falls -fever or chills, sore throat -increased hunger or thirst -increased urination -joint pain -muscles pain, spasms -problems with balance, talking, walking -restlessness or need to  keep moving -seizures -suicidal thoughts or other mood changes -trouble swallowing -uncontrollable and excessive urges (examples: gambling, binge eating, shopping, having sex) -uncontrollable head, mouth, neck, arm, or leg movements -unusually weak or tired Side effects that usually do not require medical attention (report to your doctor or health care professional if they continue or are bothersome): -blurred vision -constipation -headache -nausea, vomiting -trouble sleeping -weight gain This list may not describe all possible side effects. Call your doctor for medical advice about side effects. You may report side effects to FDA at 1-800-FDA-1088. Where should I keep my medicine? Keep out of the reach of children. Store at room temperature between 15 and 30 degrees C (59 and 86 degrees F). Throw away any unused medicine after the expiration date. NOTE: This sheet is a summary. It may not cover all possible information. If you have questions about this medicine, talk to your doctor, pharmacist, or health care provider.  2018 Elsevier/Gold Standard (2015-12-18 11:45:05)  

## 2017-04-29 ENCOUNTER — Telehealth: Payer: Self-pay

## 2017-04-29 MED ORDER — BUSPIRONE HCL 10 MG PO TABS: 10.0000 mg | ORAL_TABLET | Freq: Two times a day (BID) | ORAL | 1 refills | Status: DC

## 2017-04-29 NOTE — Telephone Encounter (Signed)
Outpatient Medication Detail    Disp Refills Start End   hydrOXYzine (VISTARIL) 25 MG capsule 90 capsule 1 04/16/2017    Sig - Route: Take 1 capsule (25 mg total) by mouth 3 (three) times daily as needed (only for severe anxiety sx). - Oral   Sent to pharmacy as: hydrOXYzine (VISTARIL) 25 MG capsule   E-Prescribing Status: Receipt confirmed by pharmacy (04/16/2017 2:36 PM EDT)    pt daughter called states that her mother husband just passed away last week and that her mother is taing 4 instead of 3 of the hydroxyzine. She wants to make sure that it is ok if not she will need something for her nerves.  She states that her husband passed away suddenly.  And she is not handling well.

## 2017-04-29 NOTE — Telephone Encounter (Signed)
Will increase BUspar to 10 mg po bid. Please let her know its OK to take vistaril 4 times a day. Please ask her to take her mom to emergency care , if she is in a crisis.

## 2017-04-30 ENCOUNTER — Telehealth: Payer: Self-pay

## 2017-04-30 NOTE — Telephone Encounter (Signed)
pt daughter was notified of the medication changes for her mom

## 2017-05-13 ENCOUNTER — Ambulatory Visit (INDEPENDENT_AMBULATORY_CARE_PROVIDER_SITE_OTHER): Payer: BLUE CROSS/BLUE SHIELD | Admitting: Licensed Clinical Social Worker

## 2017-05-13 ENCOUNTER — Encounter: Payer: Self-pay | Admitting: Psychiatry

## 2017-05-13 ENCOUNTER — Ambulatory Visit: Payer: BLUE CROSS/BLUE SHIELD | Admitting: Psychiatry

## 2017-05-13 ENCOUNTER — Other Ambulatory Visit: Payer: Self-pay

## 2017-05-13 VITALS — BP 149/80 | HR 121 | Temp 98.2°F | Ht 69.0 in

## 2017-05-13 DIAGNOSIS — G4701 Insomnia due to medical condition: Secondary | ICD-10-CM

## 2017-05-13 DIAGNOSIS — Z634 Disappearance and death of family member: Secondary | ICD-10-CM | POA: Diagnosis not present

## 2017-05-13 DIAGNOSIS — F411 Generalized anxiety disorder: Secondary | ICD-10-CM

## 2017-05-13 DIAGNOSIS — F331 Major depressive disorder, recurrent, moderate: Secondary | ICD-10-CM | POA: Diagnosis not present

## 2017-05-13 MED ORDER — DOXEPIN HCL 10 MG PO CAPS: 10.0000 mg | ORAL_CAPSULE | Freq: Every evening | ORAL | 2 refills | Status: DC | PRN

## 2017-05-13 MED ORDER — ARIPIPRAZOLE 5 MG PO TABS: 5.0000 mg | ORAL_TABLET | Freq: Every day | ORAL | 1 refills | Status: DC
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
## 2017-05-27 ENCOUNTER — Encounter: Payer: Self-pay | Admitting: Psychiatry

## 2017-05-27 ENCOUNTER — Ambulatory Visit: Payer: BLUE CROSS/BLUE SHIELD | Admitting: Psychiatry

## 2017-05-27 ENCOUNTER — Other Ambulatory Visit: Payer: Self-pay

## 2017-05-27 VITALS — BP 151/80 | HR 118 | Temp 98.3°F | Wt >= 6400 oz

## 2017-05-27 DIAGNOSIS — F411 Generalized anxiety disorder: Secondary | ICD-10-CM

## 2017-05-27 DIAGNOSIS — Z634 Disappearance and death of family member: Secondary | ICD-10-CM | POA: Diagnosis not present

## 2017-05-27 DIAGNOSIS — F331 Major depressive disorder, recurrent, moderate: Secondary | ICD-10-CM | POA: Diagnosis not present

## 2017-05-27 DIAGNOSIS — G4701 Insomnia due to medical condition: Secondary | ICD-10-CM | POA: Diagnosis not present

## 2017-05-27 MED ORDER — ARIPIPRAZOLE 2 MG PO TABS: 2.0000 mg | ORAL_TABLET | Freq: Every day | ORAL | 2 refills | Status: DC

## 2017-05-27 MED ORDER — AMITRIPTYLINE HCL 10 MG PO TABS: 10.0000 mg | ORAL_TABLET | Freq: Every evening | ORAL | 1 refills | Status: DC | PRN

## 2017-05-27 NOTE — Patient Instructions (Signed)
Amitriptyline tablets What is this medicine? AMITRIPTYLINE (a mee TRIP ti leen) is used to treat depression. This medicine may be used for other purposes; ask your health care provider or pharmacist if you have questions. COMMON BRAND NAME(S): Elavil, Vanatrip What should I tell my health care provider before I take this medicine? They need to know if you have any of these conditions: -an alcohol problem -asthma, difficulty breathing -bipolar disorder or schizophrenia -difficulty passing urine, prostate trouble -glaucoma -heart disease or previous heart attack -liver disease -over active thyroid -seizures -thoughts or plans of suicide, a previous suicide attempt, or family history of suicide attempt -an unusual or allergic reaction to amitriptyline, other medicines, foods, dyes, or preservatives -pregnant or trying to get pregnant -breast-feeding How should I use this medicine? Take this medicine by mouth with a drink of water. Follow the directions on the prescription label. You can take the tablets with or without food. Take your medicine at regular intervals. Do not take it more often than directed. Do not stop taking this medicine suddenly except upon the advice of your doctor. Stopping this medicine too quickly may cause serious side effects or your condition may worsen. A special MedGuide will be given to you by the pharmacist with each prescription and refill. Be sure to read this information carefully each time. Talk to your pediatrician regarding the use of this medicine in children. Special care may be needed. Overdosage: If you think you have taken too much of this medicine contact a poison control center or emergency room at once. NOTE: This medicine is only for you. Do not share this medicine with others. What if I miss a dose? If you miss a dose, take it as soon as you can. If it is almost time for your next dose, take only that dose. Do not take double or extra doses. What  may interact with this medicine? Do not take this medicine with any of the following medications: -arsenic trioxide -certain medicines used to regulate abnormal heartbeat or to treat other heart conditions -cisapride -droperidol -halofantrine -linezolid -MAOIs like Carbex, Eldepryl, Marplan, Nardil, and Parnate -methylene blue -other medicines for mental depression -phenothiazines like perphenazine, thioridazine and chlorpromazine -pimozide -probucol -procarbazine -sparfloxacin -St. John's Wort -ziprasidone This medicine may also interact with the following medications: -atropine and related drugs like hyoscyamine, scopolamine, tolterodine and others -barbiturate medicines for inducing sleep or treating seizures, like phenobarbital -cimetidine -disulfiram -ethchlorvynol -thyroid hormones such as levothyroxine This list may not describe all possible interactions. Give your health care provider a list of all the medicines, herbs, non-prescription drugs, or dietary supplements you use. Also tell them if you smoke, drink alcohol, or use illegal drugs. Some items may interact with your medicine. What should I watch for while using this medicine? Tell your doctor if your symptoms do not get better or if they get worse. Visit your doctor or health care professional for regular checks on your progress. Because it may take several weeks to see the full effects of this medicine, it is important to continue your treatment as prescribed by your doctor. Patients and their families should watch out for new or worsening thoughts of suicide or depression. Also watch out for sudden changes in feelings such as feeling anxious, agitated, panicky, irritable, hostile, aggressive, impulsive, severely restless, overly excited and hyperactive, or not being able to sleep. If this happens, especially at the beginning of treatment or after a change in dose, call your health care professional. You may get   drowsy or  dizzy. Do not drive, use machinery, or do anything that needs mental alertness until you know how this medicine affects you. Do not stand or sit up quickly, especially if you are an older patient. This reduces the risk of dizzy or fainting spells. Alcohol may interfere with the effect of this medicine. Avoid alcoholic drinks. Do not treat yourself for coughs, colds, or allergies without asking your doctor or health care professional for advice. Some ingredients can increase possible side effects. Your mouth may get dry. Chewing sugarless gum or sucking hard candy, and drinking plenty of water will help. Contact your doctor if the problem does not go away or is severe. This medicine may cause dry eyes and blurred vision. If you wear contact lenses you may feel some discomfort. Lubricating drops may help. See your eye doctor if the problem does not go away or is severe. This medicine can cause constipation. Try to have a bowel movement at least every 2 to 3 days. If you do not have a bowel movement for 3 days, call your doctor or health care professional. This medicine can make you more sensitive to the sun. Keep out of the sun. If you cannot avoid being in the sun, wear protective clothing and use sunscreen. Do not use sun lamps or tanning beds/booths. What side effects may I notice from receiving this medicine? Side effects that you should report to your doctor or health care professional as soon as possible: -allergic reactions like skin rash, itching or hives, swelling of the face, lips, or tongue -anxious -breathing problems -changes in vision -confusion -elevated mood, decreased need for sleep, racing thoughts, impulsive behavior -eye pain -fast, irregular heartbeat -feeling faint or lightheaded, falls -feeling agitated, angry, or irritable -fever with increased sweating -hallucination, loss of contact with reality -seizures -stiff muscles -suicidal thoughts or other mood  changes -tingling, pain, or numbness in the feet or hands -trouble passing urine or change in the amount of urine -trouble sleeping -unusually weak or tired -vomiting -yellowing of the eyes or skin Side effects that usually do not require medical attention (report to your doctor or health care professional if they continue or are bothersome): -change in sex drive or performance -change in appetite or weight -constipation -dizziness -dry mouth -nausea -tired -tremors -upset stomach This list may not describe all possible side effects. Call your doctor for medical advice about side effects. You may report side effects to FDA at 1-800-FDA-1088. Where should I keep my medicine? Keep out of the reach of children. Store at room temperature between 20 and 25 degrees C (68 and 77 degrees F). Throw away any unused medicine after the expiration date. NOTE: This sheet is a summary. It may not cover all possible information. If you have questions about this medicine, talk to your doctor, pharmacist, or health care provider.  2018 Elsevier/Gold Standard (2015-06-03 12:14:15)

## 2017-05-27 NOTE — Progress Notes (Signed)
[facility] OP Progress Note  05/27/2017 5:41 PM Lorraine Hutchinson  MRN:  [id number]  Chief Complaint: ' I am here for follow up." Chief Complaint    Follow-up; Medication Refill     HPI: Lorraine Hutchinson is a 57 year old Caucasian female, widowed, lives in [place], has a history of depression, anxiety, OSA on CPAP, chronic pain, morbid obesity, history of cervical cancer, presented to the clinic today for a follow-up visit.  Today reports she continues to struggle with her sleep.  She tried the doxepin however it has been making her groggy.  Patient reports mood wise she is improving.  She reports she is coping with her husband's loss better.  She reports she continues to take the Abilify but she has not changed the dose to 5 mg as advised last visit.  She reports she continues to take 2 mg.  She reports the 2 mg is helpful.  Patient denies any side effects to the medications.  Patient continues to be in therapy with Ms. Peacock.  She also has good social support from a family friend and also her daughter.  Patient denies any suicidality.   Visit Diagnosis:    ICD-10-CM   1. MDD (major depressive disorder), recurrent episode, moderate (HCC) F33.1   2. Generalized anxiety disorder F41.1   3. Bereavement Z63.4   4. Insomnia due to medical condition G47.01     Past Psychiatric History: Reviewed past psychiatric history from my progress note on 04/16/2017.  Past Medical History:  Past Medical History:  Diagnosis Date  . Anxiety   . Cancer (HCC)    cervical  . Depression   . Diabetes mellitus without complication ([place])   . Hyperlipidemia   . Hypertension   . Scoliosis   . Sleep apnea     Past Surgical History:  Procedure Laterality Date  . ABDOMINAL HYSTERECTOMY    . APPENDECTOMY    . CESAREAN SECTION    . REPLACEMENT TOTAL KNEE Right   . TONSILLECTOMY      Family Psychiatric History: Reviewed family psychiatric history from my progress note on 04/16/2017  Family History:  Family History   Problem Relation Age of Onset  . Diabetes Father   . Diabetes Mother   . Cancer Mother   . Anxiety disorder Sister   . Depression Sister     Social History: Reviewed social history from my progress note on 04/16/2017 Social History   Socioeconomic History  . Marital status: Married    Spouse name: arthur  . Number of children: 2  . Years of education: Not on file  . Highest education level: 11th grade  Occupational History  . Not on file  Social Needs  . Financial resource strain: Somewhat hard  . Food insecurity:    Worry: Sometimes true    Inability: Sometimes true  . Transportation needs:    Medical: No    Non-medical: No  Tobacco Use  . Smoking status: Current Some Day Smoker    Packs/day: 1.00    Years: 40.00    Pack years: 40.00    Types: Cigarettes    Last attempt to quit: 01/16/2015    Years since quitting: 2.3  . Smokeless tobacco: Never Used  Substance and Sexual Activity  . Alcohol use: No    Alcohol/week: 0.0 oz  . Drug use: Yes    Types: Marijuana  . Sexual activity: Not Currently  Lifestyle  . Physical activity:    Days per week: 0 days  Minutes per session: 0 min  . Stress: Very much  Relationships  . Social connections:    Talks on phone: More than three times a week    Gets together: Three times a week    Attends religious service: Never    Active member of club or organization: No    Attends meetings of clubs or organizations: Never    Relationship status: Married  Other Topics Concern  . Not on file  Social History Narrative  . Not on file    Allergies:  Allergies  Allergen Reactions  . Codeine Hives    Metabolic Disorder Labs: No results found for: HGBA1C, MPG No results found for: PROLACTIN No results found for: CHOL, TRIG, HDL, CHOLHDL, VLDL, LDLCALC No results found for: TSH  Therapeutic Level Labs: No results found for: LITHIUM No results found for: VALPROATE No components found for:  CBMZ  Current  Medications: Current Outpatient Medications  Medication Sig Dispense Refill  . Alpha-Lipoic Acid 100 MG CAPS Take by mouth daily.     . B Complex-Biotin-FA (TH VITAMIN B 50/B-COMPLEX) TABS Take 1 tablet by mouth daily.    . B-D UF III MINI PEN NEEDLES 31G X 5 MM MISC U UTD 2 XD  3  . busPIRone (BUSPAR) 10 MG tablet Take 1 tablet (10 mg total) by mouth 2 (two) times daily. 60 tablet 1  . Calcium-Vitamin D 600-200 MG-UNIT tablet Take 1 tablet by mouth 2 (two) times daily.    . Cinnamon 500 MG capsule Take 2,000 mg by mouth daily.     . cyclobenzaprine (FLEXERIL) 10 MG tablet Take 1 tablet (10 mg total) by mouth 3 (three) times daily as needed for muscle spasms. 30 tablet 0  . Dextromethorphan-Guaifenesin 60-1200 MG 12hr tablet Take 1 tablet by mouth as needed.     . diclofenac (VOLTAREN) 75 MG EC tablet Take 75 mg by mouth 2 (two) times daily.    Marland Kitchen diltiazem (DILACOR XR) 180 MG 24 hr capsule Take 180 mg by mouth 2 (two) times daily.    . DULoxetine (CYMBALTA) 30 MG capsule Take 1 capsule (30 mg total) by mouth daily. To be taken with 60 mg 30 capsule 2  . DULoxetine (CYMBALTA) 60 MG capsule Take 1 capsule (60 mg total) by mouth daily. To be taken with 30 mg daily 30 capsule 2  . hydrOXYzine (VISTARIL) 25 MG capsule Take 1 capsule (25 mg total) by mouth 3 (three) times daily as needed (only for severe anxiety sx). 90 capsule 1  . insulin aspart protamine - aspart (NOVOLOG MIX 70/30 FLEXPEN) (70-30) 100 UNIT/ML FlexPen Inject 60 Units into the skin daily before breakfast. 62 units before supper    . lisinopril-hydrochlorothiazide (PRINZIDE,ZESTORETIC) 20-25 MG tablet Take 1 tablet by mouth daily.    . Melatonin 10 MG TABS Take by mouth every evening.    . metFORMIN (GLUCOPHAGE-XR) 500 MG 24 hr tablet TAKE 3 TABLETS BY MOUTH DAILY WITH DINNER    . Multiple Vitamins-Minerals (ABC PLUS SENIOR ADULTS 50+ PO) Take by mouth.    Marland Kitchen omeprazole (PRILOSEC) 20 MG capsule Take 20 mg by mouth daily.    .  Potassium 99 MG TABS Take 1 tablet by mouth daily.    . pravastatin (PRAVACHOL) 40 MG tablet Take 40 mg by mouth daily.    Marland Kitchen amitriptyline (ELAVIL) 10 MG tablet Take 1-2 tablets (10-20 mg total) by mouth at bedtime as needed for sleep. 60 tablet 1  . ARIPiprazole (ABILIFY) 2  MG tablet Take 1 tablet (2 mg total) by mouth at bedtime. 30 tablet 2  . metFORMIN (GLUCOPHAGE) 1000 MG tablet Take 1,000 mg by mouth 2 (two) times daily.     No current facility-administered medications for this visit.      Musculoskeletal: Strength & Muscle Tone: within normal limits Gait & Station: normal Patient leans: N/A  Psychiatric Specialty Exam: Review of Systems  Psychiatric/Behavioral: Positive for depression. The patient has insomnia.   All other systems reviewed and are negative.   Blood pressure (!) 151/80, pulse (!) 118, temperature 98.3 F (36.8 C), temperature source Oral, weight (!) 405 lb 12.8 oz (184.1 kg).Body mass index is 59.93 kg/m.  General Appearance: Casual  Eye Contact:  Fair  Speech:  Normal Rate  Volume:  Normal  Mood:  Anxious and Dysphoric  Affect:  Congruent  Thought Process:  Goal Directed and Descriptions of Associations: Intact  Orientation:  Full (Time, Place, and Person)  Thought Content: Logical   Suicidal Thoughts:  No  Homicidal Thoughts:  No  Memory:  Immediate;   Fair Recent;   Fair Remote;   Fair  Judgement:  Fair  Insight:  Fair  Psychomotor Activity:  Normal  Concentration:  Concentration: Fair and Attention Span: Fair  Recall:  [employer] of Knowledge: Fair  Language: Fair  Akathisia:  No  Handed:  Right  AIMS (if indicated): denies tremors, rigidity  Assets:  Communication Skills Desire for Improvement Housing  ADL's:  Intact  Cognition: WNL  Sleep:  Poor   Screenings: PHQ2-9     Office Visit from 12/03/2016 in [facility] Procedure visit from 11/15/2016 in [facility] Office Visit from 10/24/2016 in [facility] Office Visit from 09/26/2016 in [facility] Nutrition from 07/07/2015 in [facility]  PHQ-2 Total Score  0  0  0  0  6  PHQ-9 Total Score  -  -  -  -  15       Assessment and Plan: Meleni is a 56 year old Caucasian female, recently widowed, lives in [place], currently taking time off from work, has a history of depression, anxiety, morbid obesity, chronic pain, OSA on CPAP, presented to the clinic today for a follow-up visit.  Patient continues to grieve the loss of her husband who passed away recently.  Patient however reports she is coping better.  She continues to struggle with sleep hence we will make the medication changes as noted below.  Plan as noted below.  Plan MDD Continue Cymbalta 90 mg p.o. daily in divided doses Continue BuSpar 10 mg p.o. twice daily Continue Abilify 2 mg p.o. nightly, patient reports she did not start taking 5 mg as prescribed last visit.  For GAD Cymbalta as prescribed BuSpar as prescribed Continue hydroxyzine 25 mg p.o. 3 times daily as needed for severe anxiety symptoms   Insomnia Will discontinue doxepin for lack of efficacy We will start Elavil 10-20 mg p.o. nightly as needed  For bereavement Patient has been referred to Ms. Peacock for psychotherapy.  Follow-up in clinic in 1 month or sooner if needed.  More than 50 % of the time was spent for psychoeducation and supportive psychotherapy and care coordination.  This note was generated in part or whole with voice recognition software. Voice recognition is usually quite accurate but there are transcription errors that can and very often do  occur. I apologize for any typographical errors that were not detected and corrected.        Ursula Alert, MD 05/28/2017, 9:00 AM

## 2017-05-28 ENCOUNTER — Encounter: Payer: Self-pay | Admitting: Psychiatry

## 2017-06-22 ENCOUNTER — Other Ambulatory Visit: Payer: Self-pay | Admitting: Psychiatry

## 2017-06-27 ENCOUNTER — Encounter

## 2017-06-27 ENCOUNTER — Ambulatory Visit: Payer: BLUE CROSS/BLUE SHIELD | Admitting: Licensed Clinical Social Worker

## 2017-06-27 ENCOUNTER — Ambulatory Visit: Payer: BLUE CROSS/BLUE SHIELD | Admitting: Psychiatry

## 2017-07-02 ENCOUNTER — Other Ambulatory Visit: Payer: Self-pay | Admitting: Psychiatry

## 2017-07-02 DIAGNOSIS — F411 Generalized anxiety disorder: Secondary | ICD-10-CM

## 2017-07-02 DIAGNOSIS — F331 Major depressive disorder, recurrent, moderate: Secondary | ICD-10-CM

## 2017-07-02 NOTE — Telephone Encounter (Signed)
Sent Vistaril refill to pharmacy

## 2017-07-08 ENCOUNTER — Ambulatory Visit: Payer: Self-pay | Admitting: Podiatry

## 2017-07-15 ENCOUNTER — Other Ambulatory Visit: Payer: Self-pay

## 2017-07-15 ENCOUNTER — Ambulatory Visit: Payer: BLUE CROSS/BLUE SHIELD | Admitting: Psychiatry

## 2017-07-15 ENCOUNTER — Encounter: Payer: Self-pay | Admitting: Psychiatry

## 2017-07-15 VITALS — BP 175/95 | HR 144 | Temp 97.7°F | Wt 386.6 lb

## 2017-07-15 DIAGNOSIS — F331 Major depressive disorder, recurrent, moderate: Secondary | ICD-10-CM

## 2017-07-15 DIAGNOSIS — F411 Generalized anxiety disorder: Secondary | ICD-10-CM

## 2017-07-15 MED ORDER — DULOXETINE HCL 30 MG PO CPEP: 30.0000 mg | ORAL_CAPSULE | Freq: Every day | ORAL | 1 refills | Status: DC

## 2017-07-15 MED ORDER — QUETIAPINE FUMARATE 25 MG PO TABS: 25.0000 mg | ORAL_TABLET | Freq: Every day | ORAL | 1 refills | Status: DC

## 2017-07-15 MED ORDER — QUETIAPINE FUMARATE 50 MG PO TABS: 25.0000 mg | ORAL_TABLET | Freq: Every day | ORAL | 0 refills | Status: DC

## 2017-07-15 MED ORDER — BUSPIRONE HCL 10 MG PO TABS: 10.0000 mg | ORAL_TABLET | Freq: Two times a day (BID) | ORAL | 0 refills | Status: DC

## 2017-07-15 MED ORDER — DULOXETINE HCL 60 MG PO CPEP: 60.0000 mg | ORAL_CAPSULE | Freq: Every day | ORAL | 0 refills | Status: DC

## 2017-07-15 NOTE — Patient Instructions (Signed)
Quetiapine tablets What is this medicine? QUETIAPINE (kwe TYE a peen) is an antipsychotic. It is used to treat schizophrenia and bipolar disorder, also known as manic-depression. This medicine may be used for other purposes; ask your health care provider or pharmacist if you have questions. COMMON BRAND NAME(S): Seroquel What should I tell my health care provider before I take this medicine? They need to know if you have any of these conditions: -brain tumor or head injury -breast cancer -cataracts -diabetes -difficulty swallowing -heart disease -kidney disease -liver disease -low blood counts, like low white cell, platelet, or red cell counts -low blood pressure or dizziness when standing up -Parkinson's disease -previous heart attack -seizures -suicidal thoughts, plans, or attempt by you or a family member -thyroid disease -an unusual or allergic reaction to quetiapine, other medicines, foods, dyes, or preservatives -pregnant or trying to get pregnant -breast-feeding How should I use this medicine? Take this medicine by mouth. Swallow it with a drink of water. Follow the directions on the prescription label. If it upsets your stomach you can take it with food. Take your medicine at regular intervals. Do not take it more often than directed. Do not stop taking except on the advice of your doctor or health care professional. A special MedGuide will be given to you by the pharmacist with each prescription and refill. Be sure to read this information carefully each time. Talk to your pediatrician regarding the use of this medicine in children. While this drug may be prescribed for children as young as 10 years for selected conditions, precautions do apply. Patients over age 59 years may have a stronger reaction to this medicine and need smaller doses. Overdosage: If you think you have taken too much of this medicine contact a poison control center or emergency room at once. NOTE: This  medicine is only for you. Do not share this medicine with others. What if I miss a dose? If you miss a dose, take it as soon as you can. If it is almost time for your next dose, take only that dose. Do not take double or extra doses. What may interact with this medicine? Do not take this medicine with any of the following medications: -certain medicines for fungal infections like fluconazole, itraconazole, ketoconazole, posaconazole, voriconazole -cisapride -dofetilide -dronedarone -droperidol -grepafloxacin -halofantrine -phenothiazines like chlorpromazine, mesoridazine, thioridazine -pimozide -sparfloxacin -ziprasidone This medicine may also interact with the following medications: -alcohol -antiviral medicines for HIV or AIDS -certain medicines for blood pressure -certain medicines for depression, anxiety, or psychotic disturbances like haloperidol, lorazepam -certain medicines for diabetes -certain medicines for Parkinson's disease -certain medicines for seizures like carbamazepine, phenobarbital, phenytoin -cimetidine -erythromycin -other medicines that prolong the QT interval (cause an abnormal heart rhythm) -rifampin -steroid medicines like prednisone or cortisone This list may not describe all possible interactions. Give your health care provider a list of all the medicines, herbs, non-prescription drugs, or dietary supplements you use. Also tell them if you smoke, drink alcohol, or use illegal drugs. Some items may interact with your medicine. What should I watch for while using this medicine? Visit your doctor or health care professional for regular checks on your progress. It may be several weeks before you see the full effects of this medicine. Your health care provider may suggest that you have your eyes examined prior to starting this medicine, and every 6 months thereafter. If you have been taking this medicine regularly for some time, do not suddenly stop taking it.  You must gradually   reduce the dose or your symptoms may get worse. Ask your doctor or health care professional for advice. Patients and their families should watch out for worsening depression or thoughts of suicide. Also watch out for sudden or severe changes in feelings such as feeling anxious, agitated, panicky, irritable, hostile, aggressive, impulsive, severely restless, overly excited and hyperactive, or not being able to sleep. If this happens, especially at the beginning of antidepressant treatment or after a change in dose, call your health care professional. You may get dizzy or drowsy. Do not drive, use machinery, or do anything that needs mental alertness until you know how this medicine affects you. Do not stand or sit up quickly, especially if you are an older patient. 57 This reduces the risk of dizzy or fainting spells. Alcohol can increase dizziness and drowsiness. Avoid alcoholic drinks. Do not treat yourself for colds, diarrhea or allergies. Ask your doctor or health care professional for advice, some ingredients may increase possible side effects. This medicine can reduce the response of your body to heat or cold. Dress warm in cold weather and stay hydrated in hot weather. If possible, avoid extreme temperatures like saunas, hot tubs, very hot or cold showers, or activities that can cause dehydration such as vigorous exercise. What side effects may I notice from receiving this medicine? Side effects that you should report to your doctor or health care professional as soon as possible: -allergic reactions like skin rash, itching or hives, swelling of the face, lips, or tongue -difficulty swallowing -fast or irregular heartbeat -fever or chills, sore throat -fever with rash, swollen lymph nodes, or swelling of the face -increased hunger or thirst -increased urination -problems with balance, talking, walking -seizures -stiff muscles -suicidal thoughts or other mood  changes -uncontrollable head, mouth, neck, arm, or leg movements -unusually weak or tired Side effects that usually do not require medical attention (report to your doctor or health care professional if they continue or are bothersome): -change in sex drive or performance -constipation -drowsy or dizzy -dry mouth -stomach upset -weight gain This list may not describe all possible side effects. Call your doctor for medical advice about side effects. You may report side effects to FDA at 1-800-FDA-1088. Where should I keep my medicine? Keep out of the reach of children. Store at room temperature between 15 and 30 degrees C (59 and 86 degrees F). Throw away any unused medicine after the expiration date. NOTE: This sheet is a summary. It may not cover all possible information. If you have questions about this medicine, talk to your doctor, pharmacist, or health care provider.  2018 Elsevier/Gold Standard (2014-07-06 13:07:35)  

## 2017-07-15 NOTE — Progress Notes (Signed)
Tat Momoli MD OP Progress Note  07/15/2017 3:29 PM Lorraine Hutchinson  MRN:  [id number]  Chief Complaint: ' I am here for follow up." Chief Complaint    Follow-up; Medication Refill     HPI: Lorraine Hutchinson is a 57 year old Caucasian female, widowed, lives in [place], has a history of depression, anxiety, OSA on CPAP, chronic pain, morbid obesity, history of cervical cancer, presented to the clinic today for a follow-up visit.  Patient today reports she continues to struggle with sleep.  She tried the Elavil which is not helpful.  Patient reports mood symptoms as otherwise getting better.  She has been coping with her husband's loss better.  Patient denies any suicidality.  Patient denies any perceptual disturbances.  Patient reports she continues to have good support system from her friends and her daughter.  Discussed changing her Abilify to Seroquel.  Provided her medication education including the risk of orthostatic hypotension as well as weight gain.  Patient agrees with same. Visit Diagnosis:    ICD-10-CM   1. MDD (major depressive disorder), recurrent episode, moderate (HCC) F33.1 QUEtiapine (SEROQUEL) 50 MG tablet    busPIRone (BUSPAR) 10 MG tablet    DULoxetine (CYMBALTA) 30 MG capsule    DULoxetine (CYMBALTA) 60 MG capsule    QUEtiapine (SEROQUEL) 25 MG tablet  2. Generalized anxiety disorder F41.1 DULoxetine (CYMBALTA) 30 MG capsule    Past Psychiatric History: Reviewed past psychiatric history from my progress note on 04/16/2017.  Past Medical History:  Past Medical History:  Diagnosis Date  . Anxiety   . Cancer (HCC)    cervical  . Depression   . Diabetes mellitus without complication ([place])   . Hyperlipidemia   . Hypertension   . Scoliosis   . Sleep apnea     Past Surgical History:  Procedure Laterality Date  . ABDOMINAL HYSTERECTOMY    . APPENDECTOMY    . CESAREAN SECTION    . REPLACEMENT TOTAL KNEE Right   . TONSILLECTOMY      Family Psychiatric History: Reviewed family  psychiatric history from my progress note on 04/16/2017.  Family History:  Family History  Problem Relation Age of Onset  . Diabetes Father   . Diabetes Mother   . Cancer Mother   . Anxiety disorder Sister   . Depression Sister     Social History: Reviewed social history from my progress note on 04/16/2017. Social History   Socioeconomic History  . Marital status: Married    Spouse name: arthur  . Number of children: 2  . Years of education: Not on file  . Highest education level: 11th grade  Occupational History  . Not on file  Social Needs  . Financial resource strain: Somewhat hard  . Food insecurity:    Worry: Sometimes true    Inability: Sometimes true  . Transportation needs:    Medical: No    Non-medical: No  Tobacco Use  . Smoking status: Current Some Day Smoker    Packs/day: 1.00    Years: 40.00    Pack years: 40.00    Types: Cigarettes    Last attempt to quit: 01/16/2015    Years since quitting: 2.4  . Smokeless tobacco: Never Used  Substance and Sexual Activity  . Alcohol use: No    Alcohol/week: 0.0 oz  . Drug use: Yes    Types: Marijuana  . Sexual activity: Not Currently  Lifestyle  . Physical activity:    Days per week: 0 days    Minutes per  session: 0 min  . Stress: Very much  Relationships  . Social connections:    Talks on phone: More than three times a week    Gets together: Three times a week    Attends religious service: Never    Active member of club or organization: No    Attends meetings of clubs or organizations: Never    Relationship status: Married  Other Topics Concern  . Not on file  Social History Narrative  . Not on file    Allergies:  Allergies  Allergen Reactions  . Codeine Hives    Metabolic Disorder Labs: No results found for: HGBA1C, MPG No results found for: PROLACTIN No results found for: CHOL, TRIG, HDL, CHOLHDL, VLDL, LDLCALC No results found for: TSH  Therapeutic Level Labs: No results found for:  LITHIUM No results found for: VALPROATE No components found for:  CBMZ  Current Medications: Current Outpatient Medications  Medication Sig Dispense Refill  . Alpha-Lipoic Acid 100 MG CAPS Take by mouth daily.     . B Complex-Biotin-FA (TH VITAMIN B 50/B-COMPLEX) TABS Take 1 tablet by mouth daily.    . B-D UF III MINI PEN NEEDLES 31G X 5 MM MISC U UTD 2 XD  3  . busPIRone (BUSPAR) 10 MG tablet Take 1 tablet (10 mg total) by mouth 2 (two) times daily. 180 tablet 0  . Calcium-Vitamin D 600-200 MG-UNIT tablet Take 1 tablet by mouth 2 (two) times daily.    . Cinnamon 500 MG capsule Take 2,000 mg by mouth daily.     . cyclobenzaprine (FLEXERIL) 10 MG tablet Take 1 tablet (10 mg total) by mouth 3 (three) times daily as needed for muscle spasms. 30 tablet 0  . Dextromethorphan-Guaifenesin 60-1200 MG 12hr tablet Take 1 tablet by mouth as needed.     . diclofenac (VOLTAREN) 75 MG EC tablet Take 75 mg by mouth 2 (two) times daily.    Marland Kitchen diltiazem (DILACOR XR) 180 MG 24 hr capsule Take 180 mg by mouth 2 (two) times daily.    . DULoxetine (CYMBALTA) 30 MG capsule Take 1 capsule (30 mg total) by mouth daily. To be taken with 60 mg 90 capsule 1  . DULoxetine (CYMBALTA) 60 MG capsule Take 1 capsule (60 mg total) by mouth daily. To be taken with 30 mg daily 90 capsule 0  . hydrOXYzine (VISTARIL) 25 MG capsule TAKE 1 CAPSULE (25 MG TOTAL) BY MOUTH 3 TIMES DAILY AS NEEDED (ONLY FOR SEVERE ANXIETY SYMPTOMS 90 capsule 2  . insulin aspart protamine - aspart (NOVOLOG MIX 70/30 FLEXPEN) (70-30) 100 UNIT/ML FlexPen Inject 60 Units into the skin daily before breakfast. 62 units before supper    . lisinopril-hydrochlorothiazide (PRINZIDE,ZESTORETIC) 20-25 MG tablet Take 1 tablet by mouth daily.    . Melatonin 10 MG TABS Take by mouth every evening.    . metFORMIN (GLUCOPHAGE-XR) 500 MG 24 hr tablet TAKE 3 TABLETS BY MOUTH DAILY WITH DINNER    . Multiple Vitamins-Minerals (ABC PLUS SENIOR ADULTS 50+ PO) Take by mouth.     Marland Kitchen omeprazole (PRILOSEC) 20 MG capsule Take 20 mg by mouth daily.    . Potassium 99 MG TABS Take 1 tablet by mouth daily.    . pravastatin (PRAVACHOL) 40 MG tablet Take 40 mg by mouth daily.    . metFORMIN (GLUCOPHAGE) 1000 MG tablet Take 1,000 mg by mouth 2 (two) times daily.    . QUEtiapine (SEROQUEL) 25 MG tablet Take 1-2 tablets (25-50 mg total) by  mouth at bedtime. 30 tablet 1  . QUEtiapine (SEROQUEL) 50 MG tablet Take 0.5-1 tablets (25-50 mg total) by mouth at bedtime. 90 tablet 0   No current facility-administered medications for this visit.      Musculoskeletal: Strength & Muscle Tone: within normal limits Gait & Station: normal Patient leans: N/A  Psychiatric Specialty Exam: Review of Systems  Psychiatric/Behavioral: The patient has insomnia.   All other systems reviewed and are negative.   Blood pressure (!) 175/95, pulse (!) 144, temperature 97.7 F (36.5 C), temperature source Oral, weight (!) 386 lb 9.6 oz (175.4 kg).Body mass index is 57.09 kg/m.  General Appearance: Casual  Eye Contact:  Fair  Speech:  Normal Rate  Volume:  Normal  Mood:  Dysphoric  Affect:  Congruent  Thought Process:  Goal Directed and Descriptions of Associations: Intact  Orientation:  Full (Time, Place, and Person)  Thought Content: Logical   Suicidal Thoughts:  No  Homicidal Thoughts:  No  Memory:  Immediate;   Fair Recent;   Fair Remote;   Fair  Judgement:  Fair  Insight:  Fair  Psychomotor Activity:  Normal  Concentration:  Concentration: Fair and Attention Span: Fair  Recall:  [employer] of Knowledge: Fair  Language: Fair  Akathisia:  No  Handed:  Right  AIMS (if indicated): denies tremors , rigidity. stiffness  Assets:  Communication Skills Desire for Improvement Social Support  ADL's:  Intact  Cognition: WNL  Sleep:  Poor   Screenings: PHQ2-9     Office Visit from 12/03/2016 in [facility] Procedure visit from 11/15/2016 in  [facility] Office Visit from 10/24/2016 in [facility] Office Visit from 09/26/2016 in [facility] Nutrition from 07/07/2015 in [facility]  PHQ-2 Total Score  0  0  0  0  6  PHQ-9 Total Score  -  -  -  -  15       Assessment and Plan: Lorraine Hutchinson is a 57 year old Caucasian female, recently widowed, lives in [place], currently taking time off from work, has a history of depression, anxiety, morbid obesity, chronic pain, OSA on CPAP, presented to the clinic today for a follow-up visit.  Patient continues to improve with regards to her mood symptoms.  She however struggles with sleep.  Discussed making medication readjustment.  Plan as noted below.  Plan MDD Continue Cymbalta 90 mg p.o. daily in divided doses.  BuSpar 10 mg p.o. twice daily. Discontinue Abilify for lack of efficacy. Start Seroquel 25-50 mg p.o. nightly for sleep.  It will also augment her Cymbalta.  Discussed with patient to start taking 25 mg and if she needs a dosage increase she can go up to 50 mg in a week or so. PHQ 9 - 12  For GAD Continue Cymbalta and BuSpar as prescribed Continue hydroxyzine 25 mg p.o. 3 times daily as needed for severe anxiety symptoms. GAD 7 = 8  Insomnia Discontinue Elavil for lack of efficacy Start Seroquel 25-50 mg p.o. nightly.  For bereavement Patient will continue psychotherapy with Ms. Peacock.  Provided medication education.   Discussed with patient that writer will not be in clinic parts of July.  Follow-up in clinic in 1 month.  More than 50 % of the time was spent for psychoeducation and supportive psychotherapy and care coordination.  This note was generated in part or whole with voice  recognition software. Voice recognition is usually quite accurate but there are transcription errors that can and very often do occur. I apologize for  any typographical errors that were not detected and corrected.          Ursula Alert, MD 07/15/2017, 3:29 PM

## 2017-07-17 ENCOUNTER — Other Ambulatory Visit: Payer: Self-pay | Admitting: Student

## 2017-07-17 DIAGNOSIS — Z1239 Encounter for other screening for malignant neoplasm of breast: Secondary | ICD-10-CM

## 2017-07-22 ENCOUNTER — Telehealth: Payer: Self-pay | Admitting: Psychiatry

## 2017-07-23 ENCOUNTER — Ambulatory Visit: Payer: BLUE CROSS/BLUE SHIELD | Admitting: Cardiovascular Disease

## 2017-07-29 ENCOUNTER — Ambulatory Visit: Payer: BLUE CROSS/BLUE SHIELD | Admitting: Podiatry

## 2017-07-29 ENCOUNTER — Encounter: Payer: Self-pay | Admitting: Podiatry

## 2017-07-29 DIAGNOSIS — M779 Enthesopathy, unspecified: Secondary | ICD-10-CM

## 2017-07-29 DIAGNOSIS — R609 Edema, unspecified: Secondary | ICD-10-CM

## 2017-07-29 DIAGNOSIS — E1142 Type 2 diabetes mellitus with diabetic polyneuropathy: Secondary | ICD-10-CM

## 2017-07-29 DIAGNOSIS — M2142 Flat foot [pes planus] (acquired), left foot: Secondary | ICD-10-CM | POA: Diagnosis not present

## 2017-07-29 DIAGNOSIS — M2141 Flat foot [pes planus] (acquired), right foot: Secondary | ICD-10-CM | POA: Diagnosis not present

## 2017-07-29 MED ORDER — MELOXICAM 7.5 MG PO TABS: 7.5000 mg | ORAL_TABLET | Freq: Every day | ORAL | 0 refills | Status: DC

## 2017-07-29 NOTE — Progress Notes (Signed)
This patient presents to the office with chief complaint of painful ankles both feet.  She says she has significant swelling both feet after a days activity.  She also says that she is experiencing sharp pain along the inside of her right ankle.  She says there is a constant pain and throbbing noted especially in her right ankle more than her left ankle. She says this pain has been going on for 4 months.  She denies any history of trauma or injury to the foot.  She has taken ibuprofen and elevated her foot but the pain continues.  She presents the office today for an evaluation and treatment of her painful ankles feet. Patient is diabetic taking insulin.  General Appearance  Alert, conversant and in no acute stress.  Vascular  Dorsalis pedis and posterior tibial  pulses are not  palpable due to foot/leg swelling both feet. bilaterally.  Capillary return is within normal limits  bilaterally. Temperature is within normal limits  bilaterally.  Neurologic  Senn-Weinstein monofilament wire diminished   bilaterally. Muscle power within normal limits bilaterally.  Nails Thick disfigured discolored nails with subungual debris  from hallux to fifth toes bilaterally. No evidence of bacterial infection or drainage bilaterally.  Orthopedic  No limitations of motion of motion feet .  No crepitus or effusions noted.  No bony pathology or digital deformities noted. Pes planus foot type.  Pain noted along the course of the posterior tibial tendon right foot/ankle.  Skin  normotropic skin with no porokeratosis noted bilaterally.  No signs of infections or ulcers noted.    PTTD  B/L  Swelling  B/l.  IE  Evaluation of her foot reveals pain along the course of the posterior tibial tendon, right foot.  There is also severe swelling noted both feet.  Told this patient that her swelling calms from above and is not caused by her foot.  Patient was treated with power step insoles to be worn in her shoe to help limit her  pronation and improve her PTTD.  Patient was also prescribed Mobic 7.5 to be taken 1 daily.  RTC 3 weeks.   Gardiner Barefoot DPM

## 2017-08-02 ENCOUNTER — Other Ambulatory Visit: Payer: Self-pay | Admitting: Psychiatry

## 2017-08-02 DIAGNOSIS — F331 Major depressive disorder, recurrent, moderate: Secondary | ICD-10-CM

## 2017-08-02 DIAGNOSIS — F411 Generalized anxiety disorder: Secondary | ICD-10-CM

## 2017-08-10 ENCOUNTER — Other Ambulatory Visit: Payer: Self-pay | Admitting: Psychiatry

## 2017-08-10 DIAGNOSIS — F331 Major depressive disorder, recurrent, moderate: Secondary | ICD-10-CM

## 2017-08-19 ENCOUNTER — Ambulatory Visit: Payer: BLUE CROSS/BLUE SHIELD | Admitting: Podiatry

## 2017-08-20 ENCOUNTER — Other Ambulatory Visit: Payer: Self-pay | Admitting: Psychiatry

## 2017-08-20 DIAGNOSIS — F411 Generalized anxiety disorder: Secondary | ICD-10-CM

## 2017-08-20 DIAGNOSIS — F331 Major depressive disorder, recurrent, moderate: Secondary | ICD-10-CM

## 2017-08-21 ENCOUNTER — Telehealth (HOSPITAL_COMMUNITY): Payer: Self-pay

## 2017-08-21 NOTE — Telephone Encounter (Signed)
Pt was given 90 days supply of seroquel through mail delivery pharmacy in July. She is no longer on elavil/amytriptline

## 2017-08-21 NOTE — Telephone Encounter (Signed)
Received a refill request from the pharmacy for Quetiapine 25mg  and Amitriptyline 10mg . No future appointment is scheduled. Please advise

## 2017-08-26 ENCOUNTER — Other Ambulatory Visit: Payer: Self-pay | Admitting: Podiatry

## 2017-08-26 NOTE — Telephone Encounter (Signed)
ERR

## 2017-08-26 NOTE — Telephone Encounter (Signed)
Dr Prudence Davidson, ok to refill?

## 2017-11-05 ENCOUNTER — Telehealth (HOSPITAL_COMMUNITY): Payer: Self-pay

## 2017-11-05 DIAGNOSIS — F411 Generalized anxiety disorder: Secondary | ICD-10-CM

## 2017-11-05 DIAGNOSIS — F331 Major depressive disorder, recurrent, moderate: Secondary | ICD-10-CM

## 2017-11-05 NOTE — Telephone Encounter (Signed)
Pharmacy called saying patient needs a refill on Duloxetine 30mg  tabs and 60mg  tabs.  Please send to [facility]

## 2017-11-06 MED ORDER — DULOXETINE HCL 60 MG PO CPEP: 60.0000 mg | ORAL_CAPSULE | Freq: Every day | ORAL | 0 refills | Status: DC

## 2017-11-06 MED ORDER — DULOXETINE HCL 30 MG PO CPEP: 30.0000 mg | ORAL_CAPSULE | Freq: Every day | ORAL | 0 refills | Status: DC

## 2017-11-06 NOTE — Telephone Encounter (Signed)
Received request for refill for cymbalta. Will only give 30 days - pt needs appointment to be seen .

## 2017-11-07 NOTE — Telephone Encounter (Signed)
left message that she will need to contact our office to make an appt to be seen and that a 30 day supply has been sent into pharmacy but an appt will need to be made before any more refills.

## 2017-12-02 ENCOUNTER — Other Ambulatory Visit: Payer: Self-pay | Admitting: Psychiatry

## 2017-12-02 DIAGNOSIS — F411 Generalized anxiety disorder: Secondary | ICD-10-CM

## 2017-12-02 DIAGNOSIS — F331 Major depressive disorder, recurrent, moderate: Secondary | ICD-10-CM

## 2017-12-09 ENCOUNTER — Other Ambulatory Visit: Payer: Self-pay | Admitting: Psychiatry

## 2017-12-09 DIAGNOSIS — F411 Generalized anxiety disorder: Secondary | ICD-10-CM

## 2017-12-09 DIAGNOSIS — F331 Major depressive disorder, recurrent, moderate: Secondary | ICD-10-CM

## 2017-12-11 ENCOUNTER — Other Ambulatory Visit: Payer: Self-pay

## 2017-12-11 ENCOUNTER — Ambulatory Visit: Payer: BLUE CROSS/BLUE SHIELD | Admitting: Psychiatry

## 2017-12-11 ENCOUNTER — Encounter: Payer: Self-pay | Admitting: Psychiatry

## 2017-12-11 VITALS — BP 108/78 | HR 108 | Temp 98.0°F | Wt 363.2 lb

## 2017-12-11 DIAGNOSIS — F411 Generalized anxiety disorder: Secondary | ICD-10-CM

## 2017-12-11 DIAGNOSIS — F331 Major depressive disorder, recurrent, moderate: Secondary | ICD-10-CM | POA: Diagnosis not present

## 2017-12-11 DIAGNOSIS — Z634 Disappearance and death of family member: Secondary | ICD-10-CM

## 2017-12-11 DIAGNOSIS — G4701 Insomnia due to medical condition: Secondary | ICD-10-CM

## 2017-12-11 MED ORDER — ESZOPICLONE 1 MG PO TABS: 1.0000 mg | ORAL_TABLET | Freq: Every evening | ORAL | 0 refills | Status: DC | PRN

## 2017-12-11 MED ORDER — HYDROXYZINE PAMOATE 25 MG PO CAPS: ORAL_CAPSULE | ORAL | 0 refills | Status: DC

## 2017-12-11 MED ORDER — BUSPIRONE HCL 10 MG PO TABS
10.0000 mg | ORAL_TABLET | Freq: Two times a day (BID) | ORAL | 0 refills | Status: DC
Start: 2017-12-11 — End: 2018-04-01

## 2017-12-11 MED ORDER — DULOXETINE HCL 60 MG PO CPEP: 60.0000 mg | ORAL_CAPSULE | Freq: Two times a day (BID) | ORAL | 0 refills | Status: DC

## 2017-12-11 NOTE — Progress Notes (Signed)
[facility] MD OP Progress Note  12/11/2017 2:20 PM Lorraine Hutchinson  MRN:  [id number]  Chief Complaint: ' I am here for follow up." Chief Complaint    Follow-up; Medication Refill; Medication Problem     HPI: Bre is a 57 year old Caucasian female, widowed, lives in [place], has a history of depression, anxiety, history of OSA on CPAP, chronic pain, morbid obesity, history of cervical cancer, presented to the clinic today for a follow-up visit.  Patient reports she continues to have on and off crying spells and sadness.  She however has been making progress.  She has been compliant with her Cymbalta.  She reports she has been taking 60 mg twice a day since the past few days and likes the effect and wants her dosage to be increased.  She reports she does have some anxiety symptoms on and off about her situational stressors.  She does have some financial issues and she is working on getting her health insurance approved for the coming year.  She reports that makes her anxious.  Patient reports sleep continues to be a problem.  She continues to wake up several times at night.  She stopped taking the Seroquel due to weight gain and does not want to be continued on that.  She reports she tried melatonin and that did not help.  She has tried medications like trazodone, Elavil, Pamelor and Ambien in the past. Discussed Lunesta and patient agrees with plan.  Patient denies any suicidality.  Patient denies any perceptual disturbances.   Visit Diagnosis:    ICD-10-CM   1. MDD (major depressive disorder), recurrent episode, moderate (HCC) F33.1 busPIRone (BUSPAR) 10 MG tablet    DULoxetine (CYMBALTA) 60 MG capsule    eszopiclone (LUNESTA) 1 MG TABS tablet  2. Generalized anxiety disorder F41.1 busPIRone (BUSPAR) 10 MG tablet    DULoxetine (CYMBALTA) 60 MG capsule    hydrOXYzine (VISTARIL) 25 MG capsule   improving  3. Insomnia due to medical condition G47.01   4. Bereavement Z63.4     Past Psychiatric  History: Have reviewed past psychiatric history from my progress note on 04/16/2017.  Trials of medications like Elavil, Seroquel, Pamelor, Ambien, trazodone, melatonin  Past Medical History:  Past Medical History:  Diagnosis Date  . Anxiety   . Cancer (HCC)    cervical  . Depression   . Diabetes mellitus without complication ([place])   . Hyperlipidemia   . Hypertension   . Scoliosis   . Sleep apnea     Past Surgical History:  Procedure Laterality Date  . ABDOMINAL HYSTERECTOMY    . APPENDECTOMY    . CESAREAN SECTION    . REPLACEMENT TOTAL KNEE Right   . TONSILLECTOMY      Family Psychiatric History: Have reviewed family psychiatric history from my progress note on 04/16/2017  Family History:  Family History  Problem Relation Age of Onset  . Diabetes Father   . Diabetes Mother   . Cancer Mother   . Anxiety disorder Sister   . Depression Sister     Social History: Reviewed social history from my progress note on 04/16/2017 Social History   Socioeconomic History  . Marital status: Widowed    Spouse name: arthur  . Number of children: 2  . Years of education: Not on file  . Highest education level: 11th grade  Occupational History  . Not on file  Social Needs  . Financial resource strain: Somewhat hard  . Food insecurity:    Worry:  Sometimes true    Inability: Sometimes true  . Transportation needs:    Medical: No    Non-medical: No  Tobacco Use  . Smoking status: Former Smoker    Packs/day: 1.00    Years: 40.00    Pack years: 40.00    Types: Cigarettes    Last attempt to quit: 08/15/2017    Years since quitting: 0.3  . Smokeless tobacco: Never Used  Substance and Sexual Activity  . Alcohol use: No    Alcohol/week: 0.0 standard drinks  . Drug use: Yes    Types: Marijuana  . Sexual activity: Not Currently  Lifestyle  . Physical activity:    Days per week: 0 days    Minutes per session: 0 min  . Stress: Very much  Relationships  . Social connections:     Talks on phone: More than three times a week    Gets together: Three times a week    Attends religious service: Never    Active member of club or organization: No    Attends meetings of clubs or organizations: Never    Relationship status: Married  Other Topics Concern  . Not on file  Social History Narrative  . Not on file    Allergies:  Allergies  Allergen Reactions  . Codeine Hives    Metabolic Disorder Labs: No results found for: HGBA1C, MPG No results found for: PROLACTIN No results found for: CHOL, TRIG, HDL, CHOLHDL, VLDL, LDLCALC No results found for: TSH  Therapeutic Level Labs: No results found for: LITHIUM No results found for: VALPROATE No components found for:  CBMZ  Current Medications: Current Outpatient Medications  Medication Sig Dispense Refill  . Alpha-Lipoic Acid 100 MG CAPS Take by mouth daily.     . B Complex-Biotin-FA (TH VITAMIN B 50/B-COMPLEX) TABS Take 1 tablet by mouth daily.    . B-D UF III MINI PEN NEEDLES 31G X 5 MM MISC U UTD 2 XD  3  . busPIRone (BUSPAR) 10 MG tablet Take 1 tablet (10 mg total) by mouth 2 (two) times daily. 180 tablet 0  . Calcium-Vitamin D 600-200 MG-UNIT tablet Take 1 tablet by mouth 2 (two) times daily.    . Cinnamon 500 MG capsule Take 2,000 mg by mouth daily.     . diclofenac (VOLTAREN) 75 MG EC tablet Take 75 mg by mouth 2 (two) times daily.    Marland Kitchen diltiazem (DILACOR XR) 180 MG 24 hr capsule Take 180 mg by mouth 2 (two) times daily.    . DULoxetine (CYMBALTA) 60 MG capsule Take 1 capsule (60 mg total) by mouth 2 (two) times daily. 180 capsule 0  . furosemide (LASIX) 20 MG tablet Take by mouth.    . hydrOXYzine (VISTARIL) 25 MG capsule TAKE 1 CAPSULE (25 MG TOTAL) BY MOUTH 3 TIMES DAILY AS NEEDED (ONLY FOR SEVERE ANXIETY SYMPTOMS) 270 capsule 0  . insulin aspart protamine - aspart (NOVOLOG MIX 70/30 FLEXPEN) (70-30) 100 UNIT/ML FlexPen Inject 60 Units into the skin daily before breakfast. 62 units before supper    .  lisinopril-hydrochlorothiazide (PRINZIDE,ZESTORETIC) 20-25 MG tablet Take 1 tablet by mouth daily.    . metFORMIN (GLUCOPHAGE-XR) 500 MG 24 hr tablet TAKE 3 TABLETS BY MOUTH DAILY WITH DINNER    . Multiple Vitamins-Minerals (ABC PLUS SENIOR ADULTS 50+ PO) Take by mouth.    Marland Kitchen omeprazole (PRILOSEC) 20 MG capsule Take 20 mg by mouth daily.    . Potassium 99 MG TABS Take 1 tablet by  mouth daily.    . pravastatin (PRAVACHOL) 40 MG tablet Take 40 mg by mouth daily.    . eszopiclone (LUNESTA) 1 MG TABS tablet Take 1 tablet (1 mg total) by mouth at bedtime as needed for sleep. Take immediately before bedtime 90 tablet 0   No current facility-administered medications for this visit.      Musculoskeletal: Strength & Muscle Tone: within normal limits Gait & Station: normal Patient leans: N/A  Psychiatric Specialty Exam: Review of Systems  Psychiatric/Behavioral: Positive for depression. The patient has insomnia.   All other systems reviewed and are negative.   Blood pressure 108/78, pulse (!) 108, temperature 98 F (36.7 C), temperature source Oral, weight (!) 363 lb 3.2 oz (164.7 kg).Body mass index is 53.64 kg/m.  General Appearance: Casual  Eye Contact:  Fair  Speech:  Normal Rate  Volume:  Normal  Mood:  Dysphoric  Affect:  Congruent  Thought Process:  Goal Directed and Descriptions of Associations: Intact  Orientation:  Full (Time, Place, and Person)  Thought Content: Logical   Suicidal Thoughts:  No  Homicidal Thoughts:  No  Memory:  Immediate;   Fair Recent;   Fair Remote;   Fair  Judgement:  Fair  Insight:  Fair  Psychomotor Activity:  Normal  Concentration:  Concentration: Fair and Attention Span: Fair  Recall:  [employer] of Knowledge: Fair  Language: Fair  Akathisia:  No  Handed:  Right  AIMS (if indicated): denies tremors, rigidity  Assets:  Communication Skills Desire for Improvement Social Support  ADL's:  Intact  Cognition: WNL  Sleep:  Poor    Screenings: PHQ2-9     Office Visit from 12/03/2016 in [facility] Procedure visit from 11/15/2016 in [facility] Office Visit from 10/24/2016 in [facility] Office Visit from 09/26/2016 in [facility] Nutrition from 07/07/2015 in [facility]  PHQ-2 Total Score  0  0  0  0  6  PHQ-9 Total Score  -  -  -  -  15       Assessment and Plan: Lilybelle is a 57 year old Caucasian female, recently widowed, lives in [place], has a history of depression, anxiety, morbid obesity, chronic pain, OSA on CPAP, presented to the clinic today for a follow-up visit.  Patient continues to have some depression and anxiety symptoms.  She continues to struggle with sleep problems .  Discussed the following medication changes.  Patient with financial issues has been unable to keep up with psychotherapy visits.  Discussed plan as noted below.  Plan MDD Increase Cymbalta to 60 mg p.o. twice daily BuSpar 10 mg p.o. twice daily Discontinue Seroquel for side effects.  For GAD BuSpar 10 mg p.o. twice daily Cymbalta 60 mg p.o. twice daily Hydroxyzine 25 mg p.o. 3 times daily as needed   For insomnia Discontinue Seroquel for side effects. Start Lunesta 1 mg p.o. nightly  For bereavement Patient reports she feels better now. Patient has been unable to keep up with psychotherapy visits due to financial stressors.  Follow-up in clinic in 3 months or sooner if needed  More than 50 % of the time was spent for psychoeducation and supportive psychotherapy and care coordination.  This note was generated in part or whole with voice recognition software. Voice recognition is usually quite accurate but there are transcription errors that can and very often do occur.  I apologize for any typographical errors that were not detected and  corrected.         Ursula Alert, MD 12/11/2017, 2:20 PM

## 2017-12-11 NOTE — Patient Instructions (Signed)
Eszopiclone tablets What is this medicine? ESZOPICLONE (es ZOE pi clone) is used to treat insomnia. This medicine helps you to fall asleep and sleep through the night. This medicine may be used for other purposes; ask your health care provider or pharmacist if you have questions. COMMON BRAND NAME(S): Lunesta What should I tell my health care provider before I take this medicine? They need to know if you have any of these conditions: -depression -history of a drug or alcohol abuse problem -liver disease -lung or breathing disease -suicidal thoughts -an unusual or allergic reaction to eszopiclone, other medicines, foods, dyes, or preservatives -pregnant or trying to get pregnant -breast-feeding How should I use this medicine? Take this medicine by mouth with a glass of water. Follow the directions on the prescription label. It is better to take this medicine on an empty stomach and only when you are ready for bed. Do not take your medicine more often than directed. If you have been taking this medicine for several weeks and suddenly stop taking it, you may get unpleasant withdrawal symptoms. Your doctor or health care professional may want to gradually reduce the dose. Do not stop taking this medicine on your own. Always follow your doctor or health care professional's advice. Talk to your pediatrician regarding the use of this medicine in children. Special care may be needed. Overdosage: If you think you have taken too much of this medicine contact a poison control center or emergency room at once. NOTE: This medicine is only for you. Do not share this medicine with others. What if I miss a dose? This does not apply. This medicine should only be taken immediately before going to sleep. Do not take double or extra doses. What may interact with this medicine? -herbal medicines like kava kava, melatonin, St. John's wort and valerian -lorazepam -medicines for fungal infections like ketoconazole,  fluconazole, or itraconazole -olanzapine This list may not describe all possible interactions. Give your health care provider a list of all the medicines, herbs, non-prescription drugs, or dietary supplements you use. Also tell them if you smoke, drink alcohol, or use illegal drugs. Some items may interact with your medicine. What should I watch for while using this medicine? Visit your doctor or health care professional for regular checks on your progress. Keep a regular sleep schedule by going to bed at about the same time nightly. Avoid caffeine-containing drinks in the evening hours, as caffeine can cause trouble with falling asleep. Talk to your doctor if you still have trouble sleeping. After taking this medicine for sleep, you may get up out of bed while not being fully awake and do an activity that you do not know you are doing. The next morning, you may have no memory of the event. Activities such as driving a car ("sleep-driving"), making and eating food, talking on the phone, sexual activity, and sleep-walking have been reported. Call your doctor right away if you find out you have done any of these activities. Do not take this medicine if you have used alcohol that evening or before bed or taken another medicine for sleep, since your risk of doing these sleep-related activities will be increased. Do not take this medicine unless you are able to stay in bed for a full night (7 to 8 hours) before you must be active again. You may have a decrease in mental alertness the day after use, even if you feel that you are fully awake. Tell your doctor if you will need to  perform activities requiring full alertness, such as driving, the next day. Do not stand or sit up quickly after taking this medicine, especially if you are an older patient. This reduces the risk of dizzy or fainting spells. If you or your family notice any changes in your behavior, such as new or worsening depression, thoughts of harming  yourself, anxiety, other unusual or disturbing thoughts, or memory loss, call your doctor right away. After you stop taking this medicine, you may have trouble falling asleep. This is called rebound insomnia. This problem usually goes away on its own after 1 or 2 nights. What side effects may I notice from receiving this medicine? Side effects that you should report to your doctor or health care professional as soon as possible: -allergic reactions like skin rash, itching or hives, swelling of the face, lips, or tongue -changes in vision -confusion -depressed mood -feeling faint or lightheaded, falls -hallucinations -problems with balance, speaking, walking -restlessness, excitability, or feelings of agitation -unusual activities while asleep like driving, eating, making phone calls Side effects that usually do not require medical attention (report to your doctor or health care professional if they continue or are bothersome): -dizziness, or daytime drowsiness, sometimes called a hangover effect -headache This list may not describe all possible side effects. Call your doctor for medical advice about side effects. You may report side effects to FDA at 1-800-FDA-1088. Where should I keep my medicine? Keep out of the reach of children. This medicine can be abused. Keep your medicine in a safe place to protect it from theft. Do not share this medicine with anyone. Selling or giving away this medicine is dangerous and against the law. This medicine may cause accidental overdose and death if taken by other adults, children, or pets. Mix any unused medicine with a substance like cat litter or coffee grounds. Then throw the medicine away in a sealed container like a sealed bag or a coffee can with a lid. Do not use the medicine after the expiration date. Store at room temperature between 15 and 30 degrees C (59 and 86 degrees F). NOTE: This sheet is a summary. It may not cover all possible information.  If you have questions about this medicine, talk to your doctor, pharmacist, or health care provider.  2018 Elsevier/Gold Standard (2013-09-22 15:22:01)

## 2018-03-13 ENCOUNTER — Ambulatory Visit: Payer: BLUE CROSS/BLUE SHIELD | Admitting: Psychiatry

## 2018-03-31 ENCOUNTER — Telehealth: Payer: Self-pay

## 2018-03-31 DIAGNOSIS — F331 Major depressive disorder, recurrent, moderate: Secondary | ICD-10-CM

## 2018-03-31 DIAGNOSIS — F411 Generalized anxiety disorder: Secondary | ICD-10-CM

## 2018-03-31 NOTE — Telephone Encounter (Signed)
called pt made an appt for 3.31-20 pt will need enough medication to get to that appt.

## 2018-03-31 NOTE — Telephone Encounter (Signed)
pt left message that she needs refills on medications

## 2018-04-01 MED ORDER — ESZOPICLONE 1 MG PO TABS: 1.0000 mg | ORAL_TABLET | Freq: Every evening | ORAL | 0 refills | Status: DC | PRN

## 2018-04-01 MED ORDER — DULOXETINE HCL 60 MG PO CPEP
60.0000 mg | ORAL_CAPSULE | Freq: Two times a day (BID) | ORAL | 0 refills | Status: DC
Start: 2018-04-01 — End: 2018-05-05

## 2018-04-01 MED ORDER — BUSPIRONE HCL 10 MG PO TABS: 10.0000 mg | ORAL_TABLET | Freq: Two times a day (BID) | ORAL | 0 refills | Status: DC

## 2018-04-01 NOTE — Telephone Encounter (Signed)
Sent meds to pharmacy  

## 2018-04-03 ENCOUNTER — Telehealth: Payer: Self-pay

## 2018-04-03 NOTE — Telephone Encounter (Signed)
Medication management - Prior authorization for patient's prescribed Eszopiclone 1 mg, one at bedtime as needed, #90 submitted online with CoverMyMeds.  PA will be reviewed and should have a response within 24 hours.

## 2018-04-07 ENCOUNTER — Telehealth: Payer: Self-pay

## 2018-04-07 DIAGNOSIS — F5105 Insomnia due to other mental disorder: Secondary | ICD-10-CM

## 2018-04-07 NOTE — Telephone Encounter (Signed)
Received message that Lorraine Hutchinson not approved by her plan. Attempted to call patient back - left message to call back to discuss sleep medications options.

## 2018-04-07 NOTE — Telephone Encounter (Signed)
pt ezopiclone table 1 mg was not approved.

## 2018-04-08 MED ORDER — ZALEPLON 5 MG PO CAPS: 5.0000 mg | ORAL_CAPSULE | Freq: Every evening | ORAL | 1 refills | Status: DC | PRN

## 2018-04-08 NOTE — Telephone Encounter (Signed)
Spoke to patient. Sent Sonata for sleep to pharmacy since she cannot afford Lunesta. She wants to reschedule her appointment , will let Sunday Spillers know.

## 2018-04-08 NOTE — Telephone Encounter (Signed)
pt states she is returning dr. Andres Shad call please call [phone]

## 2018-04-15 ENCOUNTER — Ambulatory Visit: Payer: Self-pay | Admitting: Psychiatry

## 2018-05-05 ENCOUNTER — Encounter: Payer: Self-pay | Admitting: Psychiatry

## 2018-05-05 ENCOUNTER — Other Ambulatory Visit: Payer: Self-pay

## 2018-05-05 ENCOUNTER — Ambulatory Visit (INDEPENDENT_AMBULATORY_CARE_PROVIDER_SITE_OTHER): Payer: Self-pay | Admitting: Psychiatry

## 2018-05-05 DIAGNOSIS — F5105 Insomnia due to other mental disorder: Secondary | ICD-10-CM

## 2018-05-05 DIAGNOSIS — F331 Major depressive disorder, recurrent, moderate: Secondary | ICD-10-CM

## 2018-05-05 DIAGNOSIS — F411 Generalized anxiety disorder: Secondary | ICD-10-CM

## 2018-05-05 MED ORDER — BUSPIRONE HCL 10 MG PO TABS: 10.0000 mg | ORAL_TABLET | Freq: Two times a day (BID) | ORAL | 0 refills | Status: DC

## 2018-05-05 MED ORDER — BUPROPION HCL 75 MG PO TABS: 75.0000 mg | ORAL_TABLET | Freq: Every day | ORAL | 0 refills | Status: DC

## 2018-05-05 MED ORDER — DULOXETINE HCL 60 MG PO CPEP: 60.0000 mg | ORAL_CAPSULE | Freq: Two times a day (BID) | ORAL | 0 refills | Status: DC

## 2018-05-05 MED ORDER — ZALEPLON 10 MG PO CAPS: 10.0000 mg | ORAL_CAPSULE | Freq: Every evening | ORAL | 1 refills | Status: DC | PRN

## 2018-05-05 NOTE — Progress Notes (Signed)
Virtual Visit via Telephone Hutchinson  I connected with Lorraine Hutchinson on 05/05/18 at  4:00 PM EDT by telephone and verified that I am speaking with the correct person using two identifiers.   I discussed the limitations, risks, security and privacy concerns of performing an evaluation and management service by telephone and the availability of in person appointments. I also discussed with the patient that there may be a patient responsible charge related to this service. The patient expressed understanding and agreed to proceed.   I discussed the assessment and treatment plan with the patient. The patient was provided an opportunity to ask questions and all were answered. The patient agreed with the plan and demonstrated an understanding of the instructions.   The patient was advised to call back or seek an in-person evaluation if the symptoms worsen or if the condition fails to improve as anticipated.   Lorraine Hutchinson  05/05/2018 4:29 PM Lorraine Hutchinson:  [id number]  Chief Complaint:  Chief Complaint    Follow-up     HPI: Lorraine Hutchinson is a 58 year old Caucasian female, widowed, lives in [place], has a history of depression, anxiety, history of OSA on CPAP, chronic pain, morbid obesity, history of cervical cancer,, was evaluated by phone today.  Patient today reports she continues to struggle with depressive symptoms like anhedonia, lack of motivation and sleep problems.  She reports even before the COVID-19 outbreak she did not have any motivation to get out of her house.  She reports she continues to struggle with it.  She however reports it does not affect her much since she is currently on lockdown due to the coronavirus outbreak.  She reports she has been taking the [facility] for sleep.  She reports she continues to have difficulty falling asleep.  She reports that there are times when she sleeps from 3 AM to 12 PM the next day.  She reports she stays in bed late if she cannot sleep or  she falls asleep late.  Discussed with patient about sleep hygiene.  She however has not been following a good sleep hygiene technique.  Some time was spent providing information about sleep hygiene tips.  Discussed with her to stop watching TV at least 3 hours prior to bedtime.  Discussed with her about meditation techniques, relaxation techniques, reading a book, listening to soft music prior to bedtime rather than watching TV.  Discussed with her to avoid caffeine prior to bedtime.  Discussed with her about starting Wellbutrin and she agrees with plan.  Patient denies any suicidality, homicidality or perceptual disturbances.       Visit Diagnosis:    ICD-10-CM   1. MDD (major depressive disorder), recurrent episode, moderate (HCC) F33.1 buPROPion (WELLBUTRIN) 75 MG tablet    DULoxetine (CYMBALTA) 60 MG capsule    busPIRone (BUSPAR) 10 MG tablet  2. Generalized anxiety disorder F41.1 DULoxetine (CYMBALTA) 60 MG capsule    busPIRone (BUSPAR) 10 MG tablet  3. Insomnia due to mental condition F51.05 zaleplon (SONATA) 10 MG capsule    Past Psychiatric History: I have reviewed past psychiatric history from my progress Hutchinson on 04/16/2017.  Past trials of medications like Elavil, Seroquel, Pamelor, Ambien, trazodone, melatonin.  Past Medical History:  Past Medical History:  Diagnosis Date  . Anxiety   . Cancer (HCC)    cervical  . Depression   . Diabetes mellitus without complication ([place])   . Hyperlipidemia   . Hypertension   . Scoliosis   .  Sleep apnea     Past Surgical History:  Procedure Laterality Date  . ABDOMINAL HYSTERECTOMY    . APPENDECTOMY    . CESAREAN SECTION    . REPLACEMENT TOTAL KNEE Right   . TONSILLECTOMY      Family Psychiatric History: I have reviewed family psychiatric history from my progress Hutchinson on 04/16/2017.  Family History:  Family History  Problem Relation Age of Onset  . Diabetes Father   . Diabetes Mother   . Cancer Mother   . Anxiety disorder  Sister   . Depression Sister     Social History: I have reviewed social history from my progress Hutchinson on 04/16/2017. Social History   Socioeconomic History  . Marital status: Widowed    Spouse name: arthur  . Number of children: 2  . Years of education: Not on file  . Highest education level: 11th grade  Occupational History  . Not on file  Social Needs  . Financial resource strain: Somewhat hard  . Food insecurity:    Worry: Sometimes true    Inability: Sometimes true  . Transportation needs:    Medical: No    Non-medical: No  Tobacco Use  . Smoking status: Former Smoker    Packs/day: 1.00    Years: 40.00    Pack years: 40.00    Types: Cigarettes    Last attempt to quit: 08/15/2017    Years since quitting: 0.7  . Smokeless tobacco: Never Used  Substance and Sexual Activity  . Alcohol use: No    Alcohol/week: 0.0 standard drinks  . Drug use: Yes    Types: Marijuana  . Sexual activity: Not Currently  Lifestyle  . Physical activity:    Days per week: 0 days    Minutes per session: 0 min  . Stress: Very much  Relationships  . Social connections:    Talks on phone: More than three times a week    Gets together: Three times a week    Attends religious service: Never    Active member of club or organization: No    Attends meetings of clubs or organizations: Never    Relationship status: Married  Other Topics Concern  . Not on file  Social History Narrative  . Not on file    Allergies:  Allergies  Allergen Reactions  . Codeine Hives    Metabolic Disorder Labs: No results found for: HGBA1C, MPG No results found for: PROLACTIN No results found for: CHOL, TRIG, HDL, CHOLHDL, VLDL, LDLCALC No results found for: TSH  Therapeutic Level Labs: No results found for: LITHIUM No results found for: VALPROATE No components found for:  CBMZ  Current Medications: Current Outpatient Medications  Medication Sig Dispense Refill  . cyclobenzaprine (FLEXERIL) 10 MG  tablet Take by mouth.    . Alpha-Lipoic Acid 100 MG CAPS Take by mouth daily.     . B Complex-Biotin-FA (TH VITAMIN B 50/B-COMPLEX) TABS Take 1 tablet by mouth daily.    . B-D UF III MINI PEN NEEDLES 31G X 5 MM MISC U UTD 2 XD  3  . buPROPion (WELLBUTRIN) 75 MG tablet Take 1 tablet (75 mg total) by mouth daily with breakfast. 90 tablet 0  . busPIRone (BUSPAR) 10 MG tablet Take 1 tablet (10 mg total) by mouth 2 (two) times daily. 180 tablet 0  . Calcium-Vitamin D 600-200 MG-UNIT tablet Take 1 tablet by mouth 2 (two) times daily.    . Cinnamon 500 MG capsule Take 2,000 mg by  mouth daily.     . diclofenac (VOLTAREN) 75 MG EC tablet Take 75 mg by mouth 2 (two) times daily.    Marland Kitchen diltiazem (DILACOR XR) 180 MG 24 hr capsule Take 180 mg by mouth 2 (two) times daily.    . DULoxetine (CYMBALTA) 60 MG capsule Take 1 capsule (60 mg total) by mouth 2 (two) times daily. 180 capsule 0  . furosemide (LASIX) 20 MG tablet Take by mouth.    . hydrOXYzine (VISTARIL) 25 MG capsule TAKE 1 CAPSULE (25 MG TOTAL) BY MOUTH 3 TIMES DAILY AS NEEDED (ONLY FOR SEVERE ANXIETY SYMPTOMS) 270 capsule 0  . insulin aspart protamine - aspart (NOVOLOG MIX 70/30 FLEXPEN) (70-30) 100 UNIT/ML FlexPen Inject 60 Units into the skin daily before breakfast. 62 units before supper    . lisinopril-hydrochlorothiazide (PRINZIDE,ZESTORETIC) 20-25 MG tablet Take 1 tablet by mouth daily.    . metFORMIN (GLUCOPHAGE-XR) 500 MG 24 hr tablet TAKE 3 TABLETS BY MOUTH DAILY WITH DINNER    . Multiple Vitamins-Minerals (ABC PLUS SENIOR ADULTS 50+ PO) Take by mouth.    Marland Kitchen omeprazole (PRILOSEC) 20 MG capsule Take 20 mg by mouth daily.    . Potassium 99 MG TABS Take 1 tablet by mouth daily.    . pravastatin (PRAVACHOL) 40 MG tablet Take 40 mg by mouth daily.    . zaleplon (SONATA) 10 MG capsule Take 1 capsule (10 mg total) by mouth at bedtime as needed for sleep. 30 capsule 1   No current facility-administered medications for this visit.       Musculoskeletal: Strength & Muscle Tone: UTA Gait & Station: UTA Patient leans: N/A  Psychiatric Specialty Exam: Review of Systems  Psychiatric/Behavioral: Positive for depression. The patient has insomnia.   All other systems reviewed and are negative.   There were no vitals taken for this visit.There is no height or weight on file to calculate BMI.  General Appearance: UTA  Eye Contact:  UTA  Speech:  Clear and Coherent  Volume:  Normal  Mood:  Depressed  Affect:  UTA  Thought Process:  Goal Directed and Descriptions of Associations: Intact  Orientation:  Full (Time, Place, and Person)  Thought Content: Logical   Suicidal Thoughts:  No  Homicidal Thoughts:  No  Memory:  Immediate;   Fair Recent;   Fair Remote;   Fair  Judgement:  Fair  Insight:  Fair  Psychomotor Activity:  UTA  Concentration:  Concentration: Fair and Attention Span: Fair  Recall:  [employer] of Knowledge: Fair  Language: Fair  Akathisia:  No  Handed:  Right  AIMS (if indicated): UTA  Assets:  Communication Skills Desire for Improvement Housing Social Support  ADL's:  Intact  Cognition: WNL  Sleep:  Restless   Screenings: PHQ2-9     Office Visit from 12/03/2016 in [facility] Procedure visit from 11/15/2016 in [facility] Office Visit from 10/24/2016 in [facility] Office Visit from 09/26/2016 in [facility] Nutrition from 07/07/2015 in [facility]  PHQ-2 Total Score  0  0  0  0  6  PHQ-9 Total Score  -  -  -  -  15       Assessment and Plan: Braylen is a 58 year old Caucasian female, recently widowed, lives in [place], has a history of depression, anxiety, morbid obesity, chronic pain, OSA on CPAP was evaluated by  phone today.  Patient continues to struggle with depressive symptoms.  Patient with  several psychosocial stressors including death of her husband, recent coronavirus outbreak.  Patient will benefit from continued medication changes.  Plan MDD-unstable Start Wellbutrin 75 mg p.o. daily. Cymbalta 60 mg p.o. twice daily. BuSpar 10 mg p.o. twice daily  For GAD-some progress BuSpar 10 mg p.o. twice daily Cymbalta 60 mg p.o. twice daily Hydroxyzine 25 mg p.o. 3 times daily as needed  For insomnia-restless Increase Sonata to 10 mg p.o. nightly Discussed sleep hygiene techniques.  Bereavement-improving Patient is coping better  Follow-up in clinic in 2 to 4 weeks or sooner if needed.  I have spent atleast 15 minutes non face to face with patient today. More than 50 % of the time was spent for psychoeducation and supportive psychotherapy and care coordination.  This Hutchinson was generated in part or whole with voice recognition software. Voice recognition is usually quite accurate but there are transcription errors that can and very often do occur. I apologize for any typographical errors that were not detected and corrected.        Ursula Alert, MD 05/05/2018, 4:29 PM

## 2018-06-02 ENCOUNTER — Telehealth: Payer: Self-pay

## 2018-06-02 DIAGNOSIS — F411 Generalized anxiety disorder: Secondary | ICD-10-CM

## 2018-06-02 MED ORDER — HYDROXYZINE PAMOATE 25 MG PO CAPS: ORAL_CAPSULE | ORAL | 0 refills | Status: DC

## 2018-06-02 NOTE — Telephone Encounter (Signed)
Pt called states she needs a refill on medication [facility]zine [facility] of [facility] she is almost out    hydrOXYzine (VISTARIL) 25 MG capsule  Medication  Date: 12/11/2017 Department: [facility] Ordering/Authorizing: Ursula Alert, MD  Order Providers   Prescribing Provider Encounter Provider  Ursula Alert, MD Ursula Alert, MD  Outpatient Medication Detail    Disp Refills Start End   hydrOXYzine (VISTARIL) 25 MG capsule 270 capsule 0 12/11/2017    Sig: TAKE 1 CAPSULE (25 MG TOTAL) BY MOUTH 3 TIMES DAILY AS NEEDED (ONLY FOR SEVERE ANXIETY SYMPTOMS)   Sent to pharmacy as: hydrOXYzine (VISTARIL) 25 MG capsule   E-Prescribing Status: Receipt confirmed by pharmacy (12/11/2017 2:06 PM EST)

## 2018-06-02 NOTE — Telephone Encounter (Signed)
Sent [facility]il to [facility]

## 2018-06-11 ENCOUNTER — Ambulatory Visit: Payer: Self-pay | Admitting: Psychiatry

## 2018-06-12 ENCOUNTER — Other Ambulatory Visit: Payer: Self-pay

## 2018-06-12 ENCOUNTER — Encounter: Payer: Self-pay | Admitting: Psychiatry

## 2018-06-12 ENCOUNTER — Ambulatory Visit (INDEPENDENT_AMBULATORY_CARE_PROVIDER_SITE_OTHER): Payer: PRIVATE HEALTH INSURANCE | Admitting: Psychiatry

## 2018-06-12 DIAGNOSIS — F411 Generalized anxiety disorder: Secondary | ICD-10-CM

## 2018-06-12 DIAGNOSIS — F331 Major depressive disorder, recurrent, moderate: Secondary | ICD-10-CM

## 2018-06-12 DIAGNOSIS — F5105 Insomnia due to other mental disorder: Secondary | ICD-10-CM

## 2018-06-12 MED ORDER — ZALEPLON 10 MG PO CAPS: 10.0000 mg | ORAL_CAPSULE | Freq: Every evening | ORAL | 1 refills | Status: DC | PRN

## 2018-06-12 NOTE — Progress Notes (Signed)
Virtual Visit via Video Note  I connected with Lorraine Hutchinson on 06/12/18 at  4:30 PM EDT by a video enabled telemedicine application and verified that I am speaking with the correct person using two identifiers.   I discussed the limitations of evaluation and management by telemedicine and the availability of in person appointments. The patient expressed understanding and agreed to proceed.    I discussed the assessment and treatment plan with the patient. The patient was provided an opportunity to ask questions and all were answered. The patient agreed with the plan and demonstrated an understanding of the instructions.   The patient was advised to call back or seek an in-person evaluation if the symptoms worsen or if the condition fails to improve as anticipated.  [facility] MD OP Progress Note  06/12/2018 5:00 PM Lorraine Hutchinson  MRN:  [id number]  Chief Complaint:  Chief Complaint    Follow-up     HPI: Jeffie is a 58 year old Caucasian female, widowed, lives in [place], has a history of generalized anxiety disorder, insomnia, OSA on CPAP, chronic pain, morbid obesity, cervical cancer history, was evaluated by phone today.  Attempted to contact with patient by video however was unsuccessful and the consult was changed to a phone call.  Patient today reports she is currently making some progress with regards to her mood.  She reports her lack of motivation as well as anhedonia may have improved.  She denies any significant sadness or crying spells.  She reports she continues to take the Wellbutrin which may be helping to some extent.  She denies any side effects to the medication.  She is compliant on her other medications as prescribed.  She reports sleep is better on the [place].  She continues to have some trouble falling asleep at least some nights.  However once she falls asleep she has been able to stay asleep.  Patient reports good social support system from her family.  Patient denies  any other concerns today.  Visit Diagnosis:    ICD-10-CM   1. MDD (major depressive disorder), recurrent episode, moderate (HCC) F33.1    improving  2. Generalized anxiety disorder F41.1   3. Insomnia due to mental condition F51.05 zaleplon (SONATA) 10 MG capsule    Past Psychiatric History: I have reviewed past psychiatric history from my progress note on 04/16/2017.  Past trials of medications like Elavil, Seroquel, Pamelor, Ambien, trazodone, melatonin  Past Medical History:  Past Medical History:  Diagnosis Date  . Anxiety   . Cancer (HCC)    cervical  . Depression   . Diabetes mellitus without complication ([place])   . Hyperlipidemia   . Hypertension   . Scoliosis   . Sleep apnea     Past Surgical History:  Procedure Laterality Date  . ABDOMINAL HYSTERECTOMY    . APPENDECTOMY    . CESAREAN SECTION    . REPLACEMENT TOTAL KNEE Right   . TONSILLECTOMY      Family Psychiatric History: I have reviewed family psychiatric history from my progress note on 04/16/2017.  Family History:  Family History  Problem Relation Age of Onset  . Diabetes Father   . Diabetes Mother   . Cancer Mother   . Anxiety disorder Sister   . Depression Sister     Social History: Reviewed social history from my progress note on 04/16/2017 Social History   Socioeconomic History  . Marital status: Widowed    Spouse name: arthur  . Number of children: 2  .  Years of education: Not on file  . Highest education level: 11th grade  Occupational History  . Not on file  Social Needs  . Financial resource strain: Somewhat hard  . Food insecurity:    Worry: Sometimes true    Inability: Sometimes true  . Transportation needs:    Medical: No    Non-medical: No  Tobacco Use  . Smoking status: Former Smoker    Packs/day: 1.00    Years: 40.00    Pack years: 40.00    Types: Cigarettes    Last attempt to quit: 08/15/2017    Years since quitting: 0.8  . Smokeless tobacco: Never Used  Substance and  Sexual Activity  . Alcohol use: No    Alcohol/week: 0.0 standard drinks  . Drug use: Yes    Types: Marijuana  . Sexual activity: Not Currently  Lifestyle  . Physical activity:    Days per week: 0 days    Minutes per session: 0 min  . Stress: Very much  Relationships  . Social connections:    Talks on phone: More than three times a week    Gets together: Three times a week    Attends religious service: Never    Active member of club or organization: No    Attends meetings of clubs or organizations: Never    Relationship status: Married  Other Topics Concern  . Not on file  Social History Narrative  . Not on file    Allergies:  Allergies  Allergen Reactions  . Codeine Hives    Metabolic Disorder Labs: No results found for: HGBA1C, MPG No results found for: PROLACTIN No results found for: CHOL, TRIG, HDL, CHOLHDL, VLDL, LDLCALC No results found for: TSH  Therapeutic Level Labs: No results found for: LITHIUM No results found for: VALPROATE No components found for:  CBMZ  Current Medications: Current Outpatient Medications  Medication Sig Dispense Refill  . Alpha-Lipoic Acid 100 MG CAPS Take by mouth daily.     . B Complex-Biotin-FA (TH VITAMIN B 50/B-COMPLEX) TABS Take 1 tablet by mouth daily.    . B-D UF III MINI PEN NEEDLES 31G X 5 MM MISC U UTD 2 XD  3  . buPROPion (WELLBUTRIN) 75 MG tablet Take 1 tablet (75 mg total) by mouth daily with breakfast. 90 tablet 0  . busPIRone (BUSPAR) 10 MG tablet Take 1 tablet (10 mg total) by mouth 2 (two) times daily. 180 tablet 0  . Calcium-Vitamin D 600-200 MG-UNIT tablet Take 1 tablet by mouth 2 (two) times daily.    . Cinnamon 500 MG capsule Take 2,000 mg by mouth daily.     . cyclobenzaprine (FLEXERIL) 10 MG tablet Take by mouth.    . diclofenac (VOLTAREN) 75 MG EC tablet Take 75 mg by mouth 2 (two) times daily.    Marland Kitchen diltiazem (DILACOR XR) 180 MG 24 hr capsule Take 180 mg by mouth 2 (two) times daily.    . DULoxetine  (CYMBALTA) 60 MG capsule Take 1 capsule (60 mg total) by mouth 2 (two) times daily. 180 capsule 0  . furosemide (LASIX) 20 MG tablet Take by mouth.    . hydrOXYzine (VISTARIL) 25 MG capsule TAKE 1 CAPSULE (25 MG TOTAL) BY MOUTH 3 TIMES DAILY AS NEEDED (ONLY FOR SEVERE ANXIETY SYMPTOMS) 270 capsule 0  . insulin aspart protamine - aspart (NOVOLOG MIX 70/30 FLEXPEN) (70-30) 100 UNIT/ML FlexPen Inject 60 Units into the skin daily before breakfast. 62 units before supper    .  lisinopril-hydrochlorothiazide (PRINZIDE,ZESTORETIC) 20-25 MG tablet Take 1 tablet by mouth daily.    . metFORMIN (GLUCOPHAGE-XR) 500 MG 24 hr tablet TAKE 3 TABLETS BY MOUTH DAILY WITH DINNER    . Multiple Vitamins-Minerals (ABC PLUS SENIOR ADULTS 50+ PO) Take by mouth.    Marland Kitchen omeprazole (PRILOSEC) 20 MG capsule Take 20 mg by mouth daily.    . Potassium 99 MG TABS Take 1 tablet by mouth daily.    . pravastatin (PRAVACHOL) 40 MG tablet Take 40 mg by mouth daily.    . zaleplon (SONATA) 10 MG capsule Take 1 capsule (10 mg total) by mouth at bedtime as needed for sleep. 30 capsule 1   No current facility-administered medications for this visit.      Musculoskeletal: Strength & Muscle Tone: UTA Gait & Station: UTA Patient leans: N/A  Psychiatric Specialty Exam: Review of Systems  Psychiatric/Behavioral: The patient is nervous/anxious.   All other systems reviewed and are negative.   There were no vitals taken for this visit.There is no height or weight on file to calculate BMI.  General Appearance: UTA  Eye Contact:  UTA  Speech:  Normal Rate  Volume:  Normal  Mood:  Anxious  Affect:  UTA  Thought Process:  Goal Directed and Descriptions of Associations: Intact  Orientation:  Full (Time, Place, and Person)  Thought Content: Logical   Suicidal Thoughts:  No  Homicidal Thoughts:  No  Memory:  Immediate;   Fair Recent;   Fair Remote;   Fair  Judgement:  Fair  Insight:  Fair  Psychomotor Activity:  UTA   Concentration:  Concentration: Fair and Attention Span: Fair  Recall:  [employer] of Knowledge: Fair  Language: Fair  Akathisia:  No  Handed:  Right  AIMS (if indicated): denies tremors, rigidity  Assets:  Communication Skills Desire for Improvement Social Support  ADL's:  Intact  Cognition: WNL  Sleep:  Fair   Screenings: PHQ2-9     Office Visit from 12/03/2016 in [facility] Procedure visit from 11/15/2016 in [facility] Office Visit from 10/24/2016 in [facility] Office Visit from 09/26/2016 in [facility] Nutrition from 07/07/2015 in [facility]  PHQ-2 Total Score  0  0  0  0  6  PHQ-9 Total Score  -  -  -  -  15       Assessment and Plan: Aki is a 58 year old Caucasian female, widowed, lives in [place], has a history of MDD, GAD was evaluated by telemedicine today.  Patient is currently making progress on the current medication regimen.  She does have psychosocial stressors of the COVID-19 outbreak and the death of her husband.  Patient however continues to have good social support system and is compliant on medications.  Plan MDD-improving Wellbutrin 75 mg p.o. daily Cymbalta 60 mg p.o. twice daily BuSpar 10 mg p.o. twice daily.  For GAD-improving BuSpar 10 mg p.o. twice daily Cymbalta 60 mg p.o. twice daily Hydroxyzine 25 mg p.o. 3 times daily as needed for anxiety symptoms  For insomnia-improving Sonata 10 mg p.o. nightly  Bereavement-improving We will continue to monitor closely.  She is making progress.  Follow-up in clinic in 2 months or sooner if needed.  July 29 at 4:15 PM  I have spent atleast 15 minutes non face to face with patient today. More than 50 % of the time  was spent for psychoeducation and supportive psychotherapy and care coordination.  This note was  generated in part or whole with voice recognition software. Voice recognition is usually quite accurate but there are transcription errors that can and very often do occur. I apologize for any typographical errors that were not detected and corrected.        Ursula Alert, MD 06/12/2018, 5:00 PM

## 2018-06-27 ENCOUNTER — Other Ambulatory Visit: Payer: Self-pay | Admitting: Psychiatry

## 2018-06-27 DIAGNOSIS — F411 Generalized anxiety disorder: Secondary | ICD-10-CM

## 2018-06-27 DIAGNOSIS — F331 Major depressive disorder, recurrent, moderate: Secondary | ICD-10-CM

## 2018-07-22 DIAGNOSIS — K219 Gastro-esophageal reflux disease without esophagitis: Secondary | ICD-10-CM | POA: Insufficient documentation

## 2018-07-24 ENCOUNTER — Other Ambulatory Visit: Payer: Self-pay | Admitting: Psychiatry

## 2018-07-24 DIAGNOSIS — F331 Major depressive disorder, recurrent, moderate: Secondary | ICD-10-CM

## 2018-07-24 DIAGNOSIS — F5105 Insomnia due to other mental disorder: Secondary | ICD-10-CM

## 2018-08-13 ENCOUNTER — Other Ambulatory Visit: Payer: Self-pay

## 2018-08-13 ENCOUNTER — Encounter: Payer: Self-pay | Admitting: Psychiatry

## 2018-08-13 ENCOUNTER — Ambulatory Visit (INDEPENDENT_AMBULATORY_CARE_PROVIDER_SITE_OTHER): Payer: PRIVATE HEALTH INSURANCE | Admitting: Psychiatry

## 2018-08-13 DIAGNOSIS — F411 Generalized anxiety disorder: Secondary | ICD-10-CM | POA: Diagnosis not present

## 2018-08-13 DIAGNOSIS — F331 Major depressive disorder, recurrent, moderate: Secondary | ICD-10-CM | POA: Insufficient documentation

## 2018-08-13 DIAGNOSIS — F5105 Insomnia due to other mental disorder: Secondary | ICD-10-CM | POA: Insufficient documentation

## 2018-08-13 MED ORDER — BUSPIRONE HCL 10 MG PO TABS: 10.0000 mg | ORAL_TABLET | Freq: Two times a day (BID) | ORAL | 0 refills | Status: DC

## 2018-08-13 NOTE — Progress Notes (Signed)
Virtual Visit via Video Note  I connected with Lorraine Hutchinson on 08/13/18 at  4:15 PM EDT by a video enabled telemedicine application and verified that I am speaking with the correct person using two identifiers.   I discussed the limitations of evaluation and management by telemedicine and the availability of in person appointments. The patient expressed understanding and agreed to proceed.   I discussed the assessment and treatment plan with the patient. The patient was provided an opportunity to ask questions and all were answered. The patient agreed with the plan and demonstrated an understanding of the instructions.   The patient was advised to call back or seek an in-person evaluation if the symptoms worsen or if the condition fails to improve as anticipated.   [facility] MD OP Progress Note  08/13/2018 5:27 PM Lorraine Hutchinson  MRN:  [id number]  Chief Complaint:  Chief Complaint    Follow-up     HPI: Alean is a 58 year old Caucasian female, widowed, lives in [place], has a history of GAD, MDD, insomnia, OSA on CPAP, chronic pain, morbid obesity, cervical cancer history, was evaluated by phone today.  A video call was initiated however due to connection problem it had to be changed to a phone call.  Patient today reports she is currently doing well with regards to her mood symptoms.  She denies any significant depressive symptoms.  She reports her she feels more motivated than before.  She denies any sadness or crying spells.  She reports sleep is good on the [place].  Patient does report that recently she felt like she was getting more suntanned when exposed to sun however she does not know if any of her medications is causing it or not.  She has been trying to avoid sun exposure and is making use of precautions at this time.  Discussed with patient to continue to monitor herself and let writer know.  Patient denies any suicidality, homicidality or perceptual disturbances. Visit Diagnosis:     ICD-10-CM   1. MDD (major depressive disorder), recurrent episode, moderate (HCC)  F33.1 busPIRone (BUSPAR) 10 MG tablet   improving  2. Generalized anxiety disorder  F41.1 busPIRone (BUSPAR) 10 MG tablet  3. Insomnia due to mental condition  F51.05   4. MDD (major depressive disorder), recurrent episode, moderate (HCC)  F33.1 busPIRone (BUSPAR) 10 MG tablet    Past Psychiatric History: I have reviewed past psychiatric history from my progress note on 04/16/2017.  Past trials of medications like Elavil, Seroquel, Pamelor, Ambien, trazodone, melatonin.  Past Medical History:  Past Medical History:  Diagnosis Date  . Anxiety   . Cancer (HCC)    cervical  . Depression   . Diabetes mellitus without complication ([place])   . Hyperlipidemia   . Hypertension   . Scoliosis   . Sleep apnea     Past Surgical History:  Procedure Laterality Date  . ABDOMINAL HYSTERECTOMY    . APPENDECTOMY    . CESAREAN SECTION    . REPLACEMENT TOTAL KNEE Right   . TONSILLECTOMY      Family Psychiatric History: I have reviewed family psychiatric history from my progress note on 04/16/2017.  Family History:  Family History  Problem Relation Age of Onset  . Diabetes Father   . Diabetes Mother   . Cancer Mother   . Anxiety disorder Sister   . Depression Sister     Social History: I have reviewed social history from my progress note on 04/16/2017. Social History  Socioeconomic History  . Marital status: Widowed    Spouse name: arthur  . Number of children: 2  . Years of education: Not on file  . Highest education level: 11th grade  Occupational History  . Not on file  Social Needs  . Financial resource strain: Somewhat hard  . Food insecurity    Worry: Sometimes true    Inability: Sometimes true  . Transportation needs    Medical: No    Non-medical: No  Tobacco Use  . Smoking status: Former Smoker    Packs/day: 1.00    Years: 40.00    Pack years: 40.00    Types: Cigarettes    Quit  date: 08/15/2017    Years since quitting: 0.9  . Smokeless tobacco: Never Used  Substance and Sexual Activity  . Alcohol use: No    Alcohol/week: 0.0 standard drinks  . Drug use: Yes    Types: Marijuana  . Sexual activity: Not Currently  Lifestyle  . Physical activity    Days per week: 0 days    Minutes per session: 0 min  . Stress: Very much  Relationships  . Social connections    Talks on phone: More than three times a week    Gets together: Three times a week    Attends religious service: Never    Active member of club or organization: No    Attends meetings of clubs or organizations: Never    Relationship status: Married  Other Topics Concern  . Not on file  Social History Narrative  . Not on file    Allergies:  Allergies  Allergen Reactions  . Codeine Hives    Metabolic Disorder Labs: No results found for: HGBA1C, MPG No results found for: PROLACTIN No results found for: CHOL, TRIG, HDL, CHOLHDL, VLDL, LDLCALC No results found for: TSH  Therapeutic Level Labs: No results found for: LITHIUM No results found for: VALPROATE No components found for:  CBMZ  Current Medications: Current Outpatient Medications  Medication Sig Dispense Refill  . Alpha-Lipoic Acid 100 MG CAPS Take by mouth daily.     . B Complex-Biotin-FA (TH VITAMIN B 50/B-COMPLEX) TABS Take 1 tablet by mouth daily.    . B-D UF III MINI PEN NEEDLES 31G X 5 MM MISC U UTD 2 XD  3  . buPROPion (WELLBUTRIN) 75 MG tablet TAKE 1 TABLET (75 MG TOTAL) BY MOUTH DAILY WITH BREAKFAST. 90 tablet 0  . busPIRone (BUSPAR) 10 MG tablet Take 1 tablet (10 mg total) by mouth 2 (two) times daily. 180 tablet 0  . Calcium-Vitamin D 600-200 MG-UNIT tablet Take 1 tablet by mouth 2 (two) times daily.    . Cinnamon 500 MG capsule Take 2,000 mg by mouth daily.     . cyclobenzaprine (FLEXERIL) 10 MG tablet Take by mouth.    . diclofenac (VOLTAREN) 75 MG EC tablet Take 75 mg by mouth 2 (two) times daily.    Marland Kitchen diltiazem  (DILACOR XR) 180 MG 24 hr capsule Take 180 mg by mouth 2 (two) times daily.    . DULoxetine (CYMBALTA) 60 MG capsule TAKE 1 CAPSULE (60 MG TOTAL) BY MOUTH 2 (TWO) TIMES DAILY. 180 capsule 0  . furosemide (LASIX) 20 MG tablet Take by mouth.    . hydrOXYzine (VISTARIL) 25 MG capsule TAKE 1 CAPSULE (25 MG TOTAL) BY MOUTH 3 TIMES DAILY AS NEEDED (ONLY FOR SEVERE ANXIETY SYMPTOMS) 270 capsule 0  . insulin aspart protamine - aspart (NOVOLOG MIX 70/30 FLEXPEN) (70-30) 100  UNIT/ML FlexPen Inject 60 Units into the skin daily before breakfast. 62 units before supper    . lisinopril-hydrochlorothiazide (PRINZIDE,ZESTORETIC) 20-25 MG tablet Take 1 tablet by mouth daily.    . metFORMIN (GLUCOPHAGE-XR) 500 MG 24 hr tablet TAKE 3 TABLETS BY MOUTH DAILY WITH DINNER    . Multiple Vitamins-Minerals (ABC PLUS SENIOR ADULTS 50+ PO) Take by mouth.    Marland Kitchen omeprazole (PRILOSEC) 20 MG capsule Take 20 mg by mouth daily.    . Potassium 99 MG TABS Take 1 tablet by mouth daily.    . pravastatin (PRAVACHOL) 40 MG tablet Take 40 mg by mouth daily.    . zaleplon (SONATA) 10 MG capsule TAKE 1 CAPSULE (10 MG TOTAL) BY MOUTH AT BEDTIME AS NEEDED FOR SLEEP. 30 capsule 1   No current facility-administered medications for this visit.      Musculoskeletal: Strength & Muscle Tone: UTA Gait & Station: Reports as WNL Patient leans: N/A  Psychiatric Specialty Exam: Review of Systems  Psychiatric/Behavioral: The patient is not nervous/anxious.   All other systems reviewed and are negative.   There were no vitals taken for this visit.There is no height or weight on file to calculate BMI.  General Appearance: UTA  Eye Contact:  UTA  Speech:  Clear and Coherent  Volume:  Normal  Mood:  Euthymic  Affect:  UTA  Thought Process:  Goal Directed and Descriptions of Associations: Intact  Orientation:  Full (Time, Place, and Person)  Thought Content: Logical   Suicidal Thoughts:  No  Homicidal Thoughts:  No  Memory:  Immediate;    Fair Recent;   Fair Remote;   Fair  Judgement:  Fair  Insight:  Fair  Psychomotor Activity:  UTA  Concentration:  Concentration: Fair and Attention Span: Fair  Recall:  [employer] of Knowledge: Fair  Language: Fair  Akathisia:  No  Handed:  Right  AIMS (if indicated): Denies tremors, rigidity  Assets:  Communication Skills Desire for Improvement Social Support  ADL's:  Intact  Cognition: WNL  Sleep:  Fair   Screenings: PHQ2-9     Office Visit from 12/03/2016 in [facility] Procedure visit from 11/15/2016 in [facility] Office Visit from 10/24/2016 in [facility] Office Visit from 09/26/2016 in [facility] Nutrition from 07/07/2015 in [facility]  PHQ-2 Total Score  0  0  0  0  6  PHQ-9 Total Score  -  -  -  -  15       Assessment and Plan: Teara is a 58 year old Caucasian female, widowed, lives in [place], has a history of MDD, GAD was evaluated by telemedicine today.  Patient is currently making progress on the current medication regimen.  Plan as noted below.  Plan For MDD-stable Wellbutrin 75 mg p.o. daily Cymbalta 60 mg p.o. twice daily BuSpar 10 mg p.o. twice daily  For GAD-stable BuSpar 10 mg p.o. twice daily Cymbalta 60 mg p.o. twice daily Hydroxyzine 25 mg p.o. 3 times daily PRN for severe anxiety attacks  Insomnia-stable Sonata 10 mg p.o. nightly  Bereavement-stable We will continue to monitor closely.  Follow-up in clinic in 3 months or sooner if needed.  September 29 at 4:15 PM  I have spent atleast 15 minutes non face to face with patient today. More than 50 % of the time was spent for psychoeducation and supportive psychotherapy and  care coordination.  This note was generated in part or whole with voice recognition software. Voice recognition is usually quite  accurate but there are transcription errors that can and very often do occur. I apologize for any typographical errors that were not detected and corrected.        Ursula Alert, MD 08/13/2018, 5:27 PM

## 2018-09-27 ENCOUNTER — Other Ambulatory Visit: Payer: Self-pay | Admitting: Psychiatry

## 2018-09-27 DIAGNOSIS — F411 Generalized anxiety disorder: Secondary | ICD-10-CM

## 2018-10-14 ENCOUNTER — Ambulatory Visit (INDEPENDENT_AMBULATORY_CARE_PROVIDER_SITE_OTHER): Payer: PRIVATE HEALTH INSURANCE | Admitting: Psychiatry

## 2018-10-14 ENCOUNTER — Other Ambulatory Visit: Payer: Self-pay

## 2018-10-14 DIAGNOSIS — F5105 Insomnia due to other mental disorder: Secondary | ICD-10-CM

## 2018-10-14 DIAGNOSIS — F411 Generalized anxiety disorder: Secondary | ICD-10-CM

## 2018-10-14 DIAGNOSIS — F331 Major depressive disorder, recurrent, moderate: Secondary | ICD-10-CM

## 2018-10-14 NOTE — Progress Notes (Signed)
Patient arrived late for her appointment.  Appointment was rescheduled.

## 2018-10-15 ENCOUNTER — Other Ambulatory Visit: Payer: Self-pay | Admitting: Psychiatry

## 2018-10-15 DIAGNOSIS — F331 Major depressive disorder, recurrent, moderate: Secondary | ICD-10-CM

## 2018-10-15 DIAGNOSIS — F411 Generalized anxiety disorder: Secondary | ICD-10-CM

## 2018-10-19 ENCOUNTER — Other Ambulatory Visit: Payer: Self-pay | Admitting: Psychiatry

## 2018-10-19 DIAGNOSIS — F331 Major depressive disorder, recurrent, moderate: Secondary | ICD-10-CM

## 2018-10-23 ENCOUNTER — Encounter: Payer: Self-pay | Admitting: Psychiatry

## 2018-10-23 ENCOUNTER — Other Ambulatory Visit: Payer: Self-pay

## 2018-10-23 ENCOUNTER — Ambulatory Visit (INDEPENDENT_AMBULATORY_CARE_PROVIDER_SITE_OTHER): Payer: PRIVATE HEALTH INSURANCE | Admitting: Psychiatry

## 2018-10-23 DIAGNOSIS — F331 Major depressive disorder, recurrent, moderate: Secondary | ICD-10-CM

## 2018-10-23 DIAGNOSIS — F5105 Insomnia due to other mental disorder: Secondary | ICD-10-CM

## 2018-10-23 DIAGNOSIS — F411 Generalized anxiety disorder: Secondary | ICD-10-CM

## 2018-10-23 MED ORDER — ESZOPICLONE 1 MG PO TABS: 1.0000 mg | ORAL_TABLET | Freq: Every evening | ORAL | 1 refills | Status: DC | PRN

## 2018-10-23 MED ORDER — BUSPIRONE HCL 10 MG PO TABS: 10.0000 mg | ORAL_TABLET | Freq: Two times a day (BID) | ORAL | 0 refills | Status: DC

## 2018-10-23 NOTE — Progress Notes (Signed)
Virtual Visit via Telephone Note  I connected with Lorraine Hutchinson on 10/23/18 at  2:15 PM EDT by telephone and verified that I am speaking with the correct person using two identifiers.   I discussed the limitations, risks, security and privacy concerns of performing an evaluation and management service by telephone and the availability of in person appointments. I also discussed with the patient that there may be a patient responsible charge related to this service. The patient expressed understanding and agreed to proceed.  I discussed the assessment and treatment plan with the patient. The patient was provided an opportunity to ask questions and all were answered. The patient agreed with the plan and demonstrated an understanding of the instructions.   The patient was advised to call back or seek an in-person evaluation if the symptoms worsen or if the condition fails to improve as anticipated.   Lorraine Hutchinson OP Progress Note  10/23/2018 3:56 PM Lorraine Hutchinson  MRN:  [id number]  Chief Complaint:  Chief Complaint    Follow-up     HPI: Asli is a 58 year old Caucasian female, widowed, lives in [place], has a history of GAD, MDD, insomnia, OSA on CPAP, chronic pain, morbid obesity, cervical cancer per history was evaluated by phone today.  Patient preferred to do a phone call.  Patient today reports she missed her last appointment since she did not hear her phone ring.  She reports she continues to struggle with sleep problems.  She does not think the Sonata is helpful.  Patient reports that she gets only 1 to 2 hours of sleep even with the Sonata.  It takes her a long time to fall asleep.  Patient otherwise reports her mood as okay.  She denies any significant depression or anxiety symptoms.  Patient denies any suicidality, homicidality or perceptual disturbances.  Patient denies any other concerns today. Visit Diagnosis:    ICD-10-CM   1. MDD (major depressive disorder), recurrent episode,  moderate (HCC)  F33.1 busPIRone (BUSPAR) 10 MG tablet   STABLE  2. Generalized anxiety disorder  F41.1 busPIRone (BUSPAR) 10 MG tablet   STABLE  3. Insomnia due to mental condition  F51.05 eszopiclone (LUNESTA) 1 MG TABS tablet    Past Psychiatric History: I have reviewed past psychiatric history from my progress note on 04/16/2017.  Past trials of medications like Elavil, Seroquel, Pamelor, Ambien, trazodone, melatonin  Past Medical History:  Past Medical History:  Diagnosis Date  . Anxiety   . Cancer (HCC)    cervical  . Depression   . Diabetes mellitus without complication ([place])   . Hyperlipidemia   . Hypertension   . Scoliosis   . Sleep apnea     Past Surgical History:  Procedure Laterality Date  . ABDOMINAL HYSTERECTOMY    . APPENDECTOMY    . CESAREAN SECTION    . REPLACEMENT TOTAL KNEE Right   . TONSILLECTOMY      Family Psychiatric History: I have reviewed family psychiatric history from my progress note on 04/16/2017.  Family History:  Family History  Problem Relation Age of Onset  . Diabetes Father   . Diabetes Mother   . Cancer Mother   . Anxiety disorder Sister   . Depression Sister     Social History: I have reviewed social history from my progress note on 04/16/2017. Social History   Socioeconomic History  . Marital status: Widowed    Spouse name: arthur  . Number of children: 2  . Years of education:  Not on file  . Highest education level: 11th grade  Occupational History  . Not on file  Social Needs  . Financial resource strain: Somewhat hard  . Food insecurity    Worry: Sometimes true    Inability: Sometimes true  . Transportation needs    Medical: No    Non-medical: No  Tobacco Use  . Smoking status: Former Smoker    Packs/day: 1.00    Years: 40.00    Pack years: 40.00    Types: Cigarettes    Quit date: 08/15/2017    Years since quitting: 1.1  . Smokeless tobacco: Never Used  Substance and Sexual Activity  . Alcohol use: No     Alcohol/week: 0.0 standard drinks  . Drug use: Yes    Types: Marijuana  . Sexual activity: Not Currently  Lifestyle  . Physical activity    Days per week: 0 days    Minutes per session: 0 min  . Stress: Very much  Relationships  . Social connections    Talks on phone: More than three times a week    Gets together: Three times a week    Attends religious service: Never    Active member of club or organization: No    Attends meetings of clubs or organizations: Never    Relationship status: Married  Other Topics Concern  . Not on file  Social History Narrative  . Not on file    Allergies:  Allergies  Allergen Reactions  . Codeine Hives    Metabolic Disorder Labs: No results found for: HGBA1C, MPG No results found for: PROLACTIN No results found for: CHOL, TRIG, HDL, CHOLHDL, VLDL, LDLCALC No results found for: TSH  Therapeutic Level Labs: No results found for: LITHIUM No results found for: VALPROATE No components found for:  CBMZ  Current Medications: Current Outpatient Medications  Medication Sig Dispense Refill  . Alpha-Lipoic Acid 100 MG CAPS Take by mouth daily.     . B Complex-Biotin-FA (TH VITAMIN B 50/B-COMPLEX) TABS Take 1 tablet by mouth daily.    . B-D UF III MINI PEN NEEDLES 31G X 5 MM MISC U UTD 2 XD  3  . buPROPion (WELLBUTRIN) 75 MG tablet TAKE 1 TABLET (75 MG TOTAL) BY MOUTH DAILY WITH BREAKFAST. 90 tablet 0  . busPIRone (BUSPAR) 10 MG tablet Take 1 tablet (10 mg total) by mouth 2 (two) times daily. 180 tablet 0  . Calcium-Vitamin D 600-200 MG-UNIT tablet Take 1 tablet by mouth 2 (two) times daily.    . Cinnamon 500 MG capsule Take 2,000 mg by mouth daily.     . cyclobenzaprine (FLEXERIL) 10 MG tablet Take by mouth.    . diclofenac (VOLTAREN) 75 MG EC tablet Take 75 mg by mouth 2 (two) times daily.    Marland Kitchen diltiazem (DILACOR XR) 180 MG 24 hr capsule Take 180 mg by mouth 2 (two) times daily.    . DULoxetine (CYMBALTA) 60 MG capsule TAKE 1 CAPSULE (60 MG  TOTAL) BY MOUTH 2 (TWO) TIMES DAILY. 180 capsule 0  . eszopiclone (LUNESTA) 1 MG TABS tablet Take 1 tablet (1 mg total) by mouth at bedtime as needed for sleep. Take immediately before bedtime 30 tablet 1  . furosemide (LASIX) 20 MG tablet Take by mouth.    . hydrOXYzine (VISTARIL) 25 MG capsule TAKE 1 CAPSULE (25 MG TOTAL) BY MOUTH 3 TIMES DAILY AS NEEDED (ONLY FOR SEVERE ANXIETY SYMPTOMS) 90 capsule 2  . insulin aspart protamine - aspart (NOVOLOG  MIX 70/30 FLEXPEN) (70-30) 100 UNIT/ML FlexPen Inject 60 Units into the skin daily before breakfast. 62 units before supper    . lisinopril-hydrochlorothiazide (PRINZIDE,ZESTORETIC) 20-25 MG tablet Take 1 tablet by mouth daily.    . metFORMIN (GLUCOPHAGE) 500 MG tablet TAKE 2 TABLETS BY MOUTH TWICE A DAY WITH MEALS    . metFORMIN (GLUCOPHAGE-XR) 500 MG 24 hr tablet TAKE 3 TABLETS BY MOUTH DAILY WITH DINNER    . Multiple Vitamins-Minerals (ABC PLUS SENIOR ADULTS 50+ PO) Take by mouth.    Marland Kitchen omeprazole (PRILOSEC) 20 MG capsule Take 20 mg by mouth daily.    . Potassium 99 MG TABS Take 1 tablet by mouth daily.    . pravastatin (PRAVACHOL) 40 MG tablet Take 40 mg by mouth daily.     No current facility-administered medications for this visit.      Musculoskeletal: Strength & Muscle Tone: UTA Gait & Station: Reports as WNL Patient leans: N/A  Psychiatric Specialty Exam: Review of Systems  Psychiatric/Behavioral: The patient is nervous/anxious and has insomnia.   All other systems reviewed and are negative.   There were no vitals taken for this visit.There is no height or weight on file to calculate BMI.  General Appearance: UTA  Eye Contact:  UTA  Speech:  Clear and Coherent  Volume:  Normal  Mood:  Anxious Improving  Affect:  UTA  Thought Process:  Goal Directed and Descriptions of Associations: Intact  Orientation:  Full (Time, Place, and Person)  Thought Content: Logical   Suicidal Thoughts:  No  Homicidal Thoughts:  No  Memory:   Immediate;   Fair Recent;   Fair Remote;   Fair  Judgement:  Fair  Insight:  Fair  Psychomotor Activity:  UTA  Concentration:  Concentration: Fair and Attention Span: Fair  Recall:  [employer] of Knowledge: Fair  Language: Fair  Akathisia:  No  Handed:  Right  AIMS (if indicated):Denies tremors, rigidty  Assets:  Communication Skills Desire for Improvement Housing  ADL's:  Intact  Cognition: WNL  Sleep:  Poor   Screenings: PHQ2-9     Office Visit from 12/03/2016 in [facility] Procedure visit from 11/15/2016 in [facility] Office Visit from 10/24/2016 in [facility] Office Visit from 09/26/2016 in [facility] from 07/07/2015 in [facility]  PHQ-2 Total Score  0  0  0  0  6  PHQ-9 Total Score  -  -  -  -  15       Assessment and Plan: Zofia is a 58 year old Caucasian female, widowed, lives in [place], has a history of MDD, GAD was evaluated by phone today.  Patient continues to struggle with sleep.  Discussed plan as noted below.  Plan MDD- stable Wellbutrin 75 mg p.o. daily Cymbalta 60 mg p.o. twice daily BuSpar 10 mg p.o. twice daily  GAD-stable BuSpar 10 mg p.o. twice daily Cymbalta 60 mg p.o. twice daily Hydroxyzine 25 mg p.o. 3 times daily PRN for severe anxiety attacks  Insomnia-unstable Discontinue Sonata. Start Lunesta 1 mg p.o. nightly Discussed sleep hygiene techniques like setting up bedtime and a wake up time, switching of TV phone computer prior to bedtime, working on activities that relax her prior to bedtime, avoiding caffeine and alcohol, setting the thermostat in her bedroom comfortable.  Follow-up in clinic in 4 weeks or sooner if needed.  Nov  17th at 11 AM  I have spent atleast 15 minutes non  face to face with patient today. More than 50 % of the time  was spent for psychoeducation and supportive psychotherapy and care coordination. This note was generated in part or whole with voice recognition software. Voice recognition is usually quite accurate but there are transcription errors that can and very often do occur. I apologize for any typographical errors that were not detected and corrected.       Ursula Alert, Hutchinson 10/23/2018, 3:56 PM

## 2018-10-24 ENCOUNTER — Telehealth: Payer: Self-pay

## 2018-10-24 NOTE — Telephone Encounter (Signed)
received fax that a pa is required for eszopiclone 1mg 

## 2018-10-24 NOTE — Telephone Encounter (Signed)
went online to [email or web address] and submited PA.  - pending,

## 2018-10-26 ENCOUNTER — Other Ambulatory Visit: Payer: Self-pay | Admitting: Psychiatry

## 2018-10-26 DIAGNOSIS — F411 Generalized anxiety disorder: Secondary | ICD-10-CM

## 2018-10-27 NOTE — Telephone Encounter (Signed)
pa was approved , approved 365 days / 12 months

## 2018-10-27 NOTE — Telephone Encounter (Signed)
pharmacy notified.

## 2018-12-02 ENCOUNTER — Encounter: Payer: Self-pay | Admitting: Psychiatry

## 2018-12-02 ENCOUNTER — Ambulatory Visit (INDEPENDENT_AMBULATORY_CARE_PROVIDER_SITE_OTHER): Payer: PRIVATE HEALTH INSURANCE | Admitting: Psychiatry

## 2018-12-02 ENCOUNTER — Telehealth: Payer: Self-pay

## 2018-12-02 ENCOUNTER — Other Ambulatory Visit: Payer: Self-pay

## 2018-12-02 DIAGNOSIS — F5105 Insomnia due to other mental disorder: Secondary | ICD-10-CM

## 2018-12-02 DIAGNOSIS — F411 Generalized anxiety disorder: Secondary | ICD-10-CM

## 2018-12-02 DIAGNOSIS — F3341 Major depressive disorder, recurrent, in partial remission: Secondary | ICD-10-CM | POA: Diagnosis not present

## 2018-12-02 MED ORDER — BELSOMRA 10 MG PO TABS: 5.0000 mg | ORAL_TABLET | Freq: Every evening | ORAL | 1 refills | Status: DC | PRN

## 2018-12-02 NOTE — Progress Notes (Signed)
Virtual Visit via Telephone Note  I connected with Lorraine Hutchinson on 12/02/18 at 11:00 AM EST by telephone and verified that I am speaking with the correct person using two identifiers.   I discussed the limitations, risks, security and privacy concerns of performing an evaluation and management service by telephone and the availability of in person appointments. I also discussed with the patient that there may be a patient responsible charge related to this service. The patient expressed understanding and agreed to proceed.      I discussed the assessment and treatment plan with the patient. The patient was provided an opportunity to ask questions and all were answered. The patient agreed with the plan and demonstrated an understanding of the instructions.   The patient was advised to call back or seek an in-person evaluation if the symptoms worsen or if the condition fails to improve as anticipated.   [facility] MD OP Progress Note  12/02/2018 12:55 PM Lorraine Hutchinson  MRN:  [id number]  Chief Complaint:  Chief Complaint    Follow-up     HPI: Lorraine Hutchinson is a 58 year old Caucasian female, widowed, lives in [place], has a history of GAD, MDD, insomnia, OSA on CPAP, chronic pain, morbid obesity, cervical cancer per history was evaluated by phone today.  Patient preferred to do a phone call.  Patient today reports she is doing well with regards to her mood.  She reports she however continues to struggle with sleep.  She has been trying to take Lunesta which helps to some extent.  She reports however the Lorraine Hutchinson gives her a bad taste in her mouth that she is unable to keep taking it.  Patient reports otherwise she is doing okay.  She denies suicidality, homicidality or perceptual disturbances.  Patient denies any other concerns today. Visit Diagnosis:    ICD-10-CM   1. MDD (major depressive disorder), recurrent, in partial remission ([place])  F33.41   2. Generalized anxiety disorder  F41.1    stable   3. Insomnia due to mental condition  F51.05 Suvorexant (BELSOMRA) 10 MG TABS    Past Psychiatric History: Reviewed past psychiatric history from my progress note on 04/16/2017.  Past trials of medications like Elavil, Seroquel, Pamelor, Ambien, trazodone, melatonin, Lunesta, Sonata.  Past Medical History:  Past Medical History:  Diagnosis Date  . Anxiety   . Cancer (HCC)    cervical  . Depression   . Diabetes mellitus without complication ([place])   . Hyperlipidemia   . Hypertension   . Scoliosis   . Sleep apnea     Past Surgical History:  Procedure Laterality Date  . ABDOMINAL HYSTERECTOMY    . APPENDECTOMY    . CESAREAN SECTION    . REPLACEMENT TOTAL KNEE Right   . TONSILLECTOMY      Family Psychiatric History: I have reviewed family psychiatric history from my progress note on 04/16/2017  Family History:  Family History  Problem Relation Age of Onset  . Diabetes Father   . Diabetes Mother   . Cancer Mother   . Anxiety disorder Sister   . Depression Sister     Social History: Reviewed social history from my progress note on 04/16/2017. Social History   Socioeconomic History  . Marital status: Widowed    Spouse name: arthur  . Number of children: 2  . Years of education: Not on file  . Highest education level: 11th grade  Occupational History  . Not on file  Social Needs  . Emergency planning/management officer  strain: Somewhat hard  . Food insecurity    Worry: Sometimes true    Inability: Sometimes true  . Transportation needs    Medical: No    Non-medical: No  Tobacco Use  . Smoking status: Former Smoker    Packs/day: 1.00    Years: 40.00    Pack years: 40.00    Types: Cigarettes    Quit date: 08/15/2017    Years since quitting: 1.2  . Smokeless tobacco: Never Used  Substance and Sexual Activity  . Alcohol use: No    Alcohol/week: 0.0 standard drinks  . Drug use: Yes    Types: Marijuana  . Sexual activity: Not Currently  Lifestyle  . Physical activity    Days per  week: 0 days    Minutes per session: 0 min  . Stress: Very much  Relationships  . Social connections    Talks on phone: More than three times a week    Gets together: Three times a week    Attends religious service: Never    Active member of club or organization: No    Attends meetings of clubs or organizations: Never    Relationship status: Married  Other Topics Concern  . Not on file  Social History Narrative  . Not on file    Allergies:  Allergies  Allergen Reactions  . Codeine Hives    Metabolic Disorder Labs: No results found for: HGBA1C, MPG No results found for: PROLACTIN No results found for: CHOL, TRIG, HDL, CHOLHDL, VLDL, LDLCALC No results found for: TSH  Therapeutic Level Labs: No results found for: LITHIUM No results found for: VALPROATE No components found for:  CBMZ  Current Medications: Current Outpatient Medications  Medication Sig Dispense Refill  . Alpha-Lipoic Acid 100 MG CAPS Take by mouth daily.     . B Complex-Biotin-FA (TH VITAMIN B 50/B-COMPLEX) TABS Take 1 tablet by mouth daily.    . B-D UF III MINI PEN NEEDLES 31G X 5 MM MISC U UTD 2 XD  3  . buPROPion (WELLBUTRIN) 75 MG tablet TAKE 1 TABLET (75 MG TOTAL) BY MOUTH DAILY WITH BREAKFAST. 90 tablet 0  . busPIRone (BUSPAR) 10 MG tablet Take 1 tablet (10 mg total) by mouth 2 (two) times daily. 180 tablet 0  . Calcium-Vitamin D 600-200 MG-UNIT tablet Take 1 tablet by mouth 2 (two) times daily.    . Cinnamon 500 MG capsule Take 2,000 mg by mouth daily.     . cyclobenzaprine (FLEXERIL) 10 MG tablet Take by mouth.    . diclofenac (VOLTAREN) 75 MG EC tablet Take 75 mg by mouth 2 (two) times daily.    Marland Kitchen diltiazem (DILACOR XR) 180 MG 24 hr capsule Take 180 mg by mouth 2 (two) times daily.    . DULoxetine (CYMBALTA) 60 MG capsule TAKE 1 CAPSULE (60 MG TOTAL) BY MOUTH 2 (TWO) TIMES DAILY. 180 capsule 0  . hydrOXYzine (VISTARIL) 25 MG capsule TAKE 1 CAPSULE (25 MG TOTAL) BY MOUTH 3 TIMES DAILY AS NEEDED  (ONLY FOR SEVERE ANXIETY SYMPTOMS) 270 capsule 1  . insulin aspart protamine - aspart (NOVOLOG MIX 70/30 FLEXPEN) (70-30) 100 UNIT/ML FlexPen Inject 60 Units into the skin daily before breakfast. 62 units before supper    . lisinopril-hydrochlorothiazide (PRINZIDE,ZESTORETIC) 20-25 MG tablet Take 1 tablet by mouth daily.    . metFORMIN (GLUCOPHAGE) 500 MG tablet TAKE 2 TABLETS BY MOUTH TWICE A DAY WITH MEALS    . Multiple Vitamins-Minerals (ABC PLUS SENIOR ADULTS 50+ PO)  Take by mouth.    Marland Kitchen omeprazole (PRILOSEC) 20 MG capsule Take 20 mg by mouth daily.    . pravastatin (PRAVACHOL) 40 MG tablet Take 40 mg by mouth daily.    . furosemide (LASIX) 20 MG tablet Take by mouth.    . metFORMIN (GLUCOPHAGE-XR) 500 MG 24 hr tablet TAKE 3 TABLETS BY MOUTH DAILY WITH DINNER    . Potassium 99 MG TABS Take 1 tablet by mouth daily.    . Suvorexant (BELSOMRA) 10 MG TABS Take 5-10 mg by mouth at bedtime as needed. 30 tablet 1   No current facility-administered medications for this visit.      Musculoskeletal: Strength & Muscle Tone: UTA Gait & Station: Reports as WNL Patient leans: N/A  Psychiatric Specialty Exam: Review of Systems  Psychiatric/Behavioral: Negative for depression, hallucinations and substance abuse. The patient has insomnia. The patient is not nervous/anxious.   All other systems reviewed and are negative.   There were no vitals taken for this visit.There is no height or weight on file to calculate BMI.  General Appearance: UTA  Eye Contact:  UTA  Speech:  Clear and Coherent  Volume:  Normal  Mood:  Euthymic  Affect:  UTA  Thought Process:  Goal Directed and Descriptions of Associations: Intact  Orientation:  Full (Time, Place, and Person)  Thought Content: Logical   Suicidal Thoughts:  No  Homicidal Thoughts:  No  Memory:  Immediate;   Fair Recent;   Fair Remote;   Fair  Judgement:  Fair  Insight:  Fair  Psychomotor Activity:  UTA  Concentration:  Concentration: Fair and  Attention Span: Fair  Recall:  [employer] of Knowledge: Fair  Language: Fair  Akathisia:  No  Handed:  Right  AIMS (if indicated): Denies tremors, rigidity  Assets:  Communication Skills Desire for Improvement Housing Social Support Transportation Vocational/Educational  ADL's:  Intact  Cognition: WNL  Sleep:  Poor   Screenings: PHQ2-9     Office Visit from 12/03/2016 in [facility] Procedure visit from 11/15/2016 in [facility] Office Visit from 10/24/2016 in [facility] Office Visit from 09/26/2016 in [facility] from 07/07/2015 in [facility]  PHQ-2 Total Score  0  0  0  0  6  PHQ-9 Total Score  -  -  -  -  15       Assessment and Plan: Kaleah is a 58 year old Caucasian female, widowed, lives in [place], has a history of MDD, GAD, insomnia was evaluated by phone today.  Patient continues to struggle with sleep problems and reports mood is stable.  Plan as noted below.  Plan MDD-in remission Wellbutrin 75 mg p.o. daily Cymbalta 60 mg p.o. twice daily BuSpar 10 mg p.o. twice daily  GAD-stable BuSpar 10 mg p.o. twice daily Cymbalta 60 mg p.o. twice daily Hydroxyzine 25 mg p.o. 3 times daily as needed for anxiety attacks  Insomnia-unstable Discontinue Lunesta for side effects Start Belsomra 5 to 10 mg p.o. nightly as needed Patient continues to work on sleep hygiene techniques  Follow-up in clinic in 1 month or sooner if needed.  December 16 at 3 PM  I have spent atleast 15 minutes non face to face with patient today. More than 50 % of the time was spent for psychoeducation and supportive psychotherapy and care coordination. This note was generated in part or  whole with voice recognition software. Voice recognition is usually quite accurate but there are transcription errors  that can and very often do occur. I apologize for any typographical errors that were not detected and corrected.       Ursula Alert, MD 12/02/2018, 12:55 PM

## 2018-12-02 NOTE — Telephone Encounter (Signed)
received a fax from [facility] that belsomar generic was approved for 182 days quantity up to 30 tablets per 30 days.

## 2018-12-02 NOTE — Telephone Encounter (Signed)
went to [email or web address] and submitted the prior auth . and it is pending

## 2018-12-02 NOTE — Telephone Encounter (Signed)
received a fax that a prior auth was needed for the belsomra 10mg 

## 2018-12-31 ENCOUNTER — Ambulatory Visit (INDEPENDENT_AMBULATORY_CARE_PROVIDER_SITE_OTHER): Payer: PRIVATE HEALTH INSURANCE | Admitting: Psychiatry

## 2018-12-31 ENCOUNTER — Encounter: Payer: Self-pay | Admitting: Psychiatry

## 2018-12-31 ENCOUNTER — Other Ambulatory Visit: Payer: Self-pay

## 2018-12-31 DIAGNOSIS — F411 Generalized anxiety disorder: Secondary | ICD-10-CM

## 2018-12-31 DIAGNOSIS — F3342 Major depressive disorder, recurrent, in full remission: Secondary | ICD-10-CM | POA: Diagnosis not present

## 2018-12-31 DIAGNOSIS — F5105 Insomnia due to other mental disorder: Secondary | ICD-10-CM | POA: Diagnosis not present

## 2018-12-31 DIAGNOSIS — F3341 Major depressive disorder, recurrent, in partial remission: Secondary | ICD-10-CM | POA: Insufficient documentation

## 2018-12-31 MED ORDER — BUSPIRONE HCL 10 MG PO TABS: 10.0000 mg | ORAL_TABLET | Freq: Two times a day (BID) | ORAL | 0 refills | Status: DC

## 2018-12-31 MED ORDER — DULOXETINE HCL 60 MG PO CPEP: 60.0000 mg | ORAL_CAPSULE | Freq: Two times a day (BID) | ORAL | 0 refills | Status: DC

## 2018-12-31 MED ORDER — BUPROPION HCL 75 MG PO TABS: 75.0000 mg | ORAL_TABLET | Freq: Every day | ORAL | 0 refills | Status: DC

## 2018-12-31 NOTE — Progress Notes (Signed)
Virtual Visit via Telephone Note  I connected with Lorraine Hutchinson on 12/31/18 at  3:00 PM EST by telephone and verified that I am speaking with the correct person using two identifiers.   I discussed the limitations, risks, security and privacy concerns of performing an evaluation and management service by telephone and the availability of in person appointments. I also discussed with the patient that there may be a patient responsible charge related to this service. The patient expressed understanding and agreed to proceed.       I discussed the assessment and treatment plan with the patient. The patient was provided an opportunity to ask questions and all were answered. The patient agreed with the plan and demonstrated an understanding of the instructions.   The patient was advised to call back or seek an in-person evaluation if the symptoms worsen or if the condition fails to improve as anticipated.   [facility] MD OP Progress Note  12/31/2018 3:15 PM [name]  MRN:  [id number]  Chief Complaint:  Chief Complaint    Follow-up     HPI: Lorraine Hutchinson is a 58 year old Caucasian female, widowed, lives in [place], has a history of GAD, MDD, insomnia, OSA on CPAP, chronic pain, morbid obesity, cervical cancer per history was evaluated by phone today.  Patient preferred to do a phone call.  Patient today reports she is currently doing well with regards to her mood symptoms.  She denies any significant depression or anxiety symptoms at this time.  She reports sleep is improved on the new medication.  Patient reports she enjoyed her Thanksgiving with her family.  She denies any suicidality, homicidality or perceptual disturbances.  Patient denies any other concerns today. Visit Diagnosis:    ICD-10-CM   1. MDD (major depressive disorder), recurrent, in full remission ([place])  F33.42 buPROPion (WELLBUTRIN) 75 MG tablet    DULoxetine (CYMBALTA) 60 MG capsule  2. Generalized anxiety disorder  F41.1  busPIRone (BUSPAR) 10 MG tablet   STABLE  3. Insomnia due to mental condition  F51.05     Past Psychiatric History: Reviewed past psychiatric history from my progress note on 04/16/2017.  Past trials of medications like Elavil, Seroquel, Pamelor, Ambien, trazodone, melatonin, Johnnye Sima, Sonata  Past Medical History:  Past Medical History:  Diagnosis Date  . Anxiety   . Cancer (HCC)    cervical  . Depression   . Diabetes mellitus without complication ([place])   . Hyperlipidemia   . Hypertension   . Scoliosis   . Sleep apnea     Past Surgical History:  Procedure Laterality Date  . ABDOMINAL HYSTERECTOMY    . APPENDECTOMY    . CESAREAN SECTION    . REPLACEMENT TOTAL KNEE Right   . TONSILLECTOMY      Family Psychiatric History: Reviewed family psychiatric history from my progress note on 04/16/2017. Family History:  Family History  Problem Relation Age of Onset  . Diabetes Father   . Diabetes Mother   . Cancer Mother   . Anxiety disorder Sister   . Depression Sister     Social History: Reviewed social history from my progress note on 04/16/2017. Social History   Socioeconomic History  . Marital status: Widowed    Spouse name: Lorraine Hutchinson  . Number of children: 2  . Years of education: Not on file  . Highest education level: 11th grade  Occupational History  . Not on file  Tobacco Use  . Smoking status: Former Smoker    Packs/day: 1.00  Years: 40.00    Pack years: 40.00    Types: Cigarettes    Quit date: 08/15/2017    Years since quitting: 1.3  . Smokeless tobacco: Never Used  Substance and Sexual Activity  . Alcohol use: No    Alcohol/week: 0.0 standard drinks  . Drug use: Yes    Types: Marijuana  . Sexual activity: Not Currently  Other Topics Concern  . Not on file  Social History Narrative  . Not on file   Social Determinants of Health   Financial Resource Strain:   . Difficulty of Paying Living Expenses: Not on file  Food Insecurity:   . Worried About  Charity fundraiser in the Last Year: Not on file  . Ran Out of Food in the Last Year: Not on file  Transportation Needs:   . Lack of Transportation (Medical): Not on file  . Lack of Transportation (Non-Medical): Not on file  Physical Activity:   . Days of Exercise per Week: Not on file  . Minutes of Exercise per Session: Not on file  Stress:   . Feeling of Stress : Not on file  Social Connections:   . Frequency of Communication with Friends and Family: Not on file  . Frequency of Social Gatherings with Friends and Family: Not on file  . Attends Religious Services: Not on file  . Active Member of Clubs or Organizations: Not on file  . Attends Archivist Meetings: Not on file  . Marital Status: Not on file    Allergies:  Allergies  Allergen Reactions  . Codeine Hives    Metabolic Disorder Labs: No results found for: HGBA1C, MPG No results found for: PROLACTIN No results found for: CHOL, TRIG, HDL, CHOLHDL, VLDL, LDLCALC No results found for: TSH  Therapeutic Level Labs: No results found for: LITHIUM No results found for: VALPROATE No components found for:  CBMZ  Current Medications: Current Outpatient Medications  Medication Sig Dispense Refill  . Alpha-Lipoic Acid 100 MG CAPS Take by mouth daily.     . B Complex-Biotin-FA (TH VITAMIN B 50/B-COMPLEX) TABS Take 1 tablet by mouth daily.    . B-D UF III MINI PEN NEEDLES 31G X 5 MM MISC U UTD 2 XD  3  . buPROPion (WELLBUTRIN) 75 MG tablet Take 1 tablet (75 mg total) by mouth daily with breakfast. 90 tablet 0  . busPIRone (BUSPAR) 10 MG tablet Take 1 tablet (10 mg total) by mouth 2 (two) times daily. 180 tablet 0  . Calcium Carbonate-Vitamin D 600-200 MG-UNIT TABS Take by mouth.    . Calcium-Vitamin D 600-200 MG-UNIT tablet Take 1 tablet by mouth 2 (two) times daily.    . Cinnamon 500 MG capsule Take 2,000 mg by mouth daily.     . cyclobenzaprine (FLEXERIL) 10 MG tablet Take by mouth.    . diclofenac (VOLTAREN) 75  MG EC tablet Take 75 mg by mouth 2 (two) times daily.    Marland Kitchen diltiazem (DILACOR XR) 180 MG 24 hr capsule Take 180 mg by mouth 2 (two) times daily.    . DULoxetine (CYMBALTA) 60 MG capsule Take 1 capsule (60 mg total) by mouth 2 (two) times daily. 180 capsule 0  . furosemide (LASIX) 20 MG tablet Take by mouth.    . hydrOXYzine (VISTARIL) 25 MG capsule TAKE 1 CAPSULE (25 MG TOTAL) BY MOUTH 3 TIMES DAILY AS NEEDED (ONLY FOR SEVERE ANXIETY SYMPTOMS) 270 capsule 1  . insulin aspart protamine - aspart (NOVOLOG MIX  70/30 FLEXPEN) (70-30) 100 UNIT/ML FlexPen Inject 60 Units into the skin daily before breakfast. 62 units before supper    . lisinopril-hydrochlorothiazide (PRINZIDE,ZESTORETIC) 20-25 MG tablet Take 1 tablet by mouth daily.    . metFORMIN (GLUCOPHAGE) 500 MG tablet TAKE 2 TABLETS BY MOUTH TWICE A DAY WITH MEALS    . Multiple Vitamins-Minerals (ABC PLUS SENIOR ADULTS 50+ PO) Take by mouth.    Marland Kitchen omeprazole (PRILOSEC) 20 MG capsule Take 20 mg by mouth daily.    . Potassium 99 MG TABS Take 1 tablet by mouth daily.    . pravastatin (PRAVACHOL) 40 MG tablet Take 40 mg by mouth daily.    . Suvorexant (BELSOMRA) 10 MG TABS Take 5-10 mg by mouth at bedtime as needed. 30 tablet 1   No current facility-administered medications for this visit.     Musculoskeletal: Strength & Muscle Tone: UTA Gait & Station: Reports as WNL Patient leans: N/A  Psychiatric Specialty Exam: Review of Systems  Psychiatric/Behavioral: Negative for agitation, behavioral problems, confusion, decreased concentration, dysphoric mood, hallucinations, self-injury, sleep disturbance and suicidal ideas. The patient is not nervous/anxious and is not hyperactive.   All other systems reviewed and are negative.   There were no vitals taken for this visit.There is no height or weight on file to calculate BMI.  General Appearance: UTA  Eye Contact:  UTA  Speech:  Clear and Coherent  Volume:  Normal  Mood:  Euthymic  Affect:  UTA   Thought Process:  Goal Directed and Descriptions of Associations: Intact  Orientation:  Full (Time, Place, and Person)  Thought Content: Logical   Suicidal Thoughts:  No  Homicidal Thoughts:  No  Memory:  Immediate;   Fair Recent;   Fair Remote;   Fair  Judgement:  Fair  Insight:  Fair  Psychomotor Activity:  UTA  Concentration:  Concentration: Fair and Attention Span: Fair  Recall:  [employer] of Knowledge: Fair  Language: Fair  Akathisia:  No  Handed:  Right  AIMS (if indicated): Denies tremors, rigidity  Assets:  Communication Skills Desire for Improvement Housing Social Support  ADL's:  Intact  Cognition: WNL  Sleep:  Fair   Screenings: PHQ2-9     Office Visit from 12/03/2016 in [facility] Procedure visit from 11/15/2016 in [facility] Office Visit from 10/24/2016 in [facility] Office Visit from 09/26/2016 in [facility] from 07/07/2015 in [facility]  PHQ-2 Total Score  0  0  0  0  6  PHQ-9 Total Score  --  --  --  --  15       Assessment and Plan: Grisel is a 58 year old Caucasian female, widowed, lives in [place], has a history of MDD, GAD, insomnia was evaluated by phone today.  Patient is currently doing well on the current medication regimen.  Plan as noted below.  Plan MDD in remission Wellbutrin 75 mg p.o. daily Cymbalta 60 mg p.o. twice daily BuSpar 10 mg p.o. twice daily  GAD-stable BuSpar 10 mg p.o. twice daily Cymbalta 60 mg p.o. twice daily Hydroxyzine 25 mg p.o. 3 times daily as needed for anxiety attacks  Insomnia-stable Belsomra 5 to 10 mg p.o. nightly as needed  Follow-up in clinic in 3 months or sooner if needed.  March 18 at 4 PM  I have spent atleast 15 minutes non  face to face  with patient today. More than 50 % of the time was spent for  psychoeducation and supportive psychotherapy and care coordination. This note was generated in part or whole with voice recognition software. Voice recognition is usually quite accurate but there are transcription errors that can and very often do occur. I apologize for any typographical errors that were not detected and corrected.       Ursula Alert, MD 12/31/2018, 3:15 PM

## 2019-01-17 ENCOUNTER — Other Ambulatory Visit: Payer: Self-pay | Admitting: Psychiatry

## 2019-01-17 DIAGNOSIS — F3342 Major depressive disorder, recurrent, in full remission: Secondary | ICD-10-CM

## 2019-02-26 ENCOUNTER — Telehealth: Payer: Self-pay

## 2019-02-26 DIAGNOSIS — F5105 Insomnia due to other mental disorder: Secondary | ICD-10-CM

## 2019-02-26 DIAGNOSIS — F411 Generalized anxiety disorder: Secondary | ICD-10-CM

## 2019-02-26 MED ORDER — BELSOMRA 10 MG PO TABS: 5.0000 mg | ORAL_TABLET | Freq: Every evening | ORAL | 1 refills | Status: DC | PRN

## 2019-02-26 MED ORDER — HYDROXYZINE PAMOATE 25 MG PO CAPS: ORAL_CAPSULE | ORAL | 1 refills | Status: DC

## 2019-02-26 NOTE — Telephone Encounter (Signed)
Sent refills to pharmacy for Lorraine Hutchinson, vistaril

## 2019-02-26 NOTE — Telephone Encounter (Signed)
pt called states she needs medication refills with belsomra and hydroxyzine

## 2019-02-27 ENCOUNTER — Telehealth: Payer: Self-pay

## 2019-02-27 NOTE — Telephone Encounter (Signed)
Returned call to patient to discuss medication change, left message.

## 2019-02-27 NOTE — Telephone Encounter (Signed)
received fax that belsomra is not covered under patient plan can medication be switched?

## 2019-03-02 NOTE — Telephone Encounter (Signed)
received a notice is that the belsora is not covered by patient insurance

## 2019-03-03 NOTE — Telephone Encounter (Signed)
Returned call to patient.  Left message to call back. 

## 2019-04-02 ENCOUNTER — Ambulatory Visit (INDEPENDENT_AMBULATORY_CARE_PROVIDER_SITE_OTHER): Payer: PRIVATE HEALTH INSURANCE | Admitting: Psychiatry

## 2019-04-02 ENCOUNTER — Encounter: Payer: Self-pay | Admitting: Psychiatry

## 2019-04-02 ENCOUNTER — Other Ambulatory Visit: Payer: Self-pay

## 2019-04-02 DIAGNOSIS — Z91148 Patient's other noncompliance with medication regimen for other reason: Secondary | ICD-10-CM

## 2019-04-02 DIAGNOSIS — F411 Generalized anxiety disorder: Secondary | ICD-10-CM

## 2019-04-02 DIAGNOSIS — Z9114 Patient's other noncompliance with medication regimen: Secondary | ICD-10-CM | POA: Insufficient documentation

## 2019-04-02 DIAGNOSIS — Z634 Disappearance and death of family member: Secondary | ICD-10-CM

## 2019-04-02 DIAGNOSIS — F3342 Major depressive disorder, recurrent, in full remission: Secondary | ICD-10-CM

## 2019-04-02 DIAGNOSIS — F5105 Insomnia due to other mental disorder: Secondary | ICD-10-CM

## 2019-04-02 MED ORDER — BUPROPION HCL 75 MG PO TABS: 75.0000 mg | ORAL_TABLET | Freq: Every day | ORAL | 0 refills | Status: DC

## 2019-04-02 MED ORDER — DULOXETINE HCL 60 MG PO CPEP: 60.0000 mg | ORAL_CAPSULE | Freq: Two times a day (BID) | ORAL | 0 refills | Status: DC

## 2019-04-02 MED ORDER — BUSPIRONE HCL 10 MG PO TABS: 10.0000 mg | ORAL_TABLET | Freq: Two times a day (BID) | ORAL | 0 refills | Status: DC

## 2019-04-02 NOTE — Progress Notes (Signed)
Provider Location : [facility] Patient Location : Home  Virtual Visit via Telephone Note  I connected with Lorraine Hutchinson on 04/02/19 at  4:00 PM EDT by telephone and verified that I am speaking with the correct person using two identifiers.   I discussed the limitations, risks, security and privacy concerns of performing an evaluation and management service by telephone and the availability of in person appointments. I also discussed with the patient that there may be a patient responsible charge related to this service. The patient expressed understanding and agreed to proceed.     I discussed the assessment and treatment plan with the patient. The patient was provided an opportunity to ask questions and all were answered. The patient agreed with the plan and demonstrated an understanding of the instructions.   The patient was advised to call back or seek an in-person evaluation if the symptoms worsen or if the condition fails to improve as anticipated.  [facility] MD OP Progress Note  04/02/2019 5:02 PM Lorraine Hutchinson  MRN:  [id number]  Chief Complaint:  Chief Complaint    Follow-up     HPI: Lorraine Hutchinson is a 59 year old Caucasian female, widowed, lives in [place], has a history of GAD, MDD, insomnia, OSA on CPAP, chronic pain, morbid obesity, cervical cancer per history was evaluated by phone today.  Patient reported to a phone call.  Patient today reports she is currently struggling with crying spells and sadness on and off.  It is the death anniversary of her husband coming up on April 9.  Patient is grieving her husband.  She however reports she has been trying to cope with it by making use of her support system and being with family.  That does help.  She continues to struggle with sleep.  She reports she stays up the whole night and sleeps through the day.  This has been going on since the past several days.  Patient however today reports she has been noncompliant on her Belsomra, she has not even  picked up that medication from the pharmacy yet.  Patient reports otherwise she is doing okay.  She denies any suicidality, homicidality or perceptual disturbances.  Patient denies any other concerns today. Visit Diagnosis:    ICD-10-CM   1. MDD (major depressive disorder), recurrent, in full remission ([place])  F33.42 buPROPion (WELLBUTRIN) 75 MG tablet    DULoxetine (CYMBALTA) 60 MG capsule  2. Generalized anxiety disorder  F41.1 busPIRone (BUSPAR) 10 MG tablet   STABLE  3. Insomnia due to mental condition  F51.05   4. Bereavement  Z63.4   5. Noncompliance with medication regimen  Z91.14     Past Psychiatric History: I have reviewed past psychiatric history from my progress note on 04/16/2017.  Past trials of medications like Elavil, Seroquel, Pamelor, Ambien, trazodone, melatonin, Johnnye Sima, Sonata  Past Medical History:  Past Medical History:  Diagnosis Date  . Anxiety   . Cancer (HCC)    cervical  . Depression   . Diabetes mellitus without complication ([place])   . Hyperlipidemia   . Hypertension   . Scoliosis   . Sleep apnea     Past Surgical History:  Procedure Laterality Date  . ABDOMINAL HYSTERECTOMY    . APPENDECTOMY    . CESAREAN SECTION    . REPLACEMENT TOTAL KNEE Right   . TONSILLECTOMY      Family Psychiatric History: I have reviewed family psychiatric history from my progress note on 04/16/2017.  Family History:  Family History  Problem Relation Age of Onset  . Diabetes Father   . Diabetes Mother   . Cancer Mother   . Anxiety disorder Sister   . Depression Sister     Social History: I have reviewed social history from my progress note on 04/16/2017. Social History   Socioeconomic History  . Marital status: Widowed    Spouse name: arthur  . Number of children: 2  . Years of education: Not on file  . Highest education level: 11th grade  Occupational History  . Not on file  Tobacco Use  . Smoking status: Former Smoker    Packs/day: 1.00    Years: 40.00     Pack years: 40.00    Types: Cigarettes    Quit date: 08/15/2017    Years since quitting: 1.6  . Smokeless tobacco: Never Used  Substance and Sexual Activity  . Alcohol use: No    Alcohol/week: 0.0 standard drinks  . Drug use: Yes    Types: Marijuana  . Sexual activity: Not Currently  Other Topics Concern  . Not on file  Social History Narrative  . Not on file   Social Determinants of Health   Financial Resource Strain:   . Difficulty of Paying Living Expenses:   Food Insecurity:   . Worried About Charity fundraiser in the Last Year:   . Arboriculturist in the Last Year:   Transportation Needs:   . Film/video editor (Medical):   Marland Kitchen Lack of Transportation (Non-Medical):   Physical Activity:   . Days of Exercise per Week:   . Minutes of Exercise per Session:   Stress:   . Feeling of Stress :   Social Connections:   . Frequency of Communication with Friends and Family:   . Frequency of Social Gatherings with Friends and Family:   . Attends Religious Services:   . Active Member of Clubs or Organizations:   . Attends Archivist Meetings:   Marland Kitchen Marital Status:     Allergies:  Allergies  Allergen Reactions  . Codeine Hives    Metabolic Disorder Labs: No results found for: HGBA1C, MPG No results found for: PROLACTIN No results found for: CHOL, TRIG, HDL, CHOLHDL, VLDL, LDLCALC No results found for: TSH  Therapeutic Level Labs: No results found for: LITHIUM No results found for: VALPROATE No components found for:  CBMZ  Current Medications: Current Outpatient Medications  Medication Sig Dispense Refill  . diltiazem (TIAZAC) 180 MG 24 hr capsule Take by mouth.    . Alpha-Lipoic Acid 100 MG CAPS Take by mouth daily.     . B Complex-Biotin-FA (TH VITAMIN B 50/B-COMPLEX) TABS Take 1 tablet by mouth daily.    . B-D UF III MINI PEN NEEDLES 31G X 5 MM MISC U UTD 2 XD  3  . buPROPion (WELLBUTRIN) 75 MG tablet Take 1 tablet (75 mg total) by mouth daily with  breakfast. 90 tablet 0  . busPIRone (BUSPAR) 10 MG tablet Take 1 tablet (10 mg total) by mouth 2 (two) times daily. 180 tablet 0  . Calcium Carbonate-Vitamin D 600-200 MG-UNIT TABS Take by mouth.    . Calcium-Vitamin D 600-200 MG-UNIT tablet Take 1 tablet by mouth 2 (two) times daily.    . Cinnamon 500 MG capsule Take 2,000 mg by mouth daily.     . cyclobenzaprine (FLEXERIL) 10 MG tablet Take by mouth.    . diclofenac (VOLTAREN) 75 MG EC tablet Take 75 mg by mouth 2 (two)  times daily.    Marland Kitchen diltiazem (DILACOR XR) 180 MG 24 hr capsule Take 180 mg by mouth 2 (two) times daily.    . DULoxetine (CYMBALTA) 60 MG capsule Take 1 capsule (60 mg total) by mouth 2 (two) times daily. 180 capsule 0  . furosemide (LASIX) 20 MG tablet Take by mouth.    . hydrOXYzine (VISTARIL) 25 MG capsule 1 capsule 3 times a day as needed 270 capsule 1  . insulin aspart protamine - aspart (NOVOLOG MIX 70/30 FLEXPEN) (70-30) 100 UNIT/ML FlexPen Inject 60 Units into the skin daily before breakfast. 62 units before supper    . lisinopril-hydrochlorothiazide (PRINZIDE,ZESTORETIC) 20-25 MG tablet Take 1 tablet by mouth daily.    . metFORMIN (GLUCOPHAGE) 500 MG tablet TAKE 2 TABLETS BY MOUTH TWICE A DAY WITH MEALS    . Multiple Vitamins-Minerals (ABC PLUS SENIOR ADULTS 50+ PO) Take by mouth.    Marland Kitchen omeprazole (PRILOSEC) 20 MG capsule Take 20 mg by mouth daily.    . Potassium 99 MG TABS Take 1 tablet by mouth daily.    . pravastatin (PRAVACHOL) 40 MG tablet Take 40 mg by mouth daily.    . Suvorexant (BELSOMRA) 10 MG TABS Take 5-10 mg by mouth at bedtime as needed. 30 tablet 1   No current facility-administered medications for this visit.     Musculoskeletal: Strength & Muscle Tone: UTA Gait & Station: UTA Patient leans: N/A  Psychiatric Specialty Exam: Review of Systems  Psychiatric/Behavioral: Positive for dysphoric mood and sleep disturbance.  All other systems reviewed and are negative.   There were no vitals taken  for this visit.There is no height or weight on file to calculate BMI.  General Appearance: UTA  Eye Contact:  UTA  Speech:  Clear and Coherent  Volume:  Normal  Mood:  Dysphoric  Affect:  UTA  Thought Process:  Goal Directed and Descriptions of Associations: Intact  Orientation:  Full (Time, Place, and Person)  Thought Content: Logical   Suicidal Thoughts:  No  Homicidal Thoughts:  No  Memory:  Immediate;   Fair Recent;   Fair Remote;   Fair  Judgement:  Fair  Insight:  Fair  Psychomotor Activity:  UTA  Concentration:  Concentration: Fair and Attention Span: Fair  Recall:  [employer] of Knowledge: Fair  Language: Fair  Akathisia:  No  Handed:  Right  AIMS (if indicated): UTA  Assets:  Wellsite geologist  ADL's:  Intact  Cognition: WNL  Sleep:  Poor   Screenings: PHQ2-9     Office Visit from 12/03/2016 in [facility] Procedure visit from 11/15/2016 in [facility] Office Visit from 10/24/2016 in [facility] Office Visit from 09/26/2016 in [facility] from 07/07/2015 in [facility]  PHQ-2 Total Score  0  0  0  0  6  PHQ-9 Total Score  --  --  --  --  15       Assessment and Plan: Earnestene is a 59 year old Caucasian female, widowed, lives in [place], has a history of MDD, GAD, insomnia was evaluated by phone today.  Patient is currently grieving the loss of her husband since since the death anniversary of her husband coming up.  Patient also struggles with sleep and has been noncompliant with medications.  Plan as noted below.  Plan MDD in remission Wellbutrin 75  mg p.o. daily Cymbalta 60 mg p.o. twice daily BuSpar 10 mg p.o. twice daily  GAD-stable BuSpar 10 mg p.o. twice daily Cymbalta 60 mg p.o. twice daily Hydroxyzine 25 mg 3 times  daily as needed for anxiety attacks  Insomnia-unstable Patient has been noncompliant with medication regimen. Encourage patient to start taking Belsomra 5 to 10 mg p.o. nightly as needed.  Patient today reports she did not even pick up the prescription from the pharmacy.  Noncompliance with medication regimen-encouraged compliance.  Bereavement-improving Patient is currently grieving the loss of her husband, his death anniversary is beginning of April.  Provided brief counseling.    Follow-up in clinic in 3 weeks or sooner if needed.  I have spent atleast 20 minutes non face to face with patient today. More than 50 % of the time was spent for preparing to see the patient ( e.g., review of test, records ), ordering medications and test ,psychoeducation and supportive psychotherapy and care coordination,as well as documenting clinical information in electronic health record. This note was generated in part or whole with voice recognition software. Voice recognition is usually quite accurate but there are transcription errors that can and very often do occur. I apologize for any typographical errors that were not detected and corrected.       Ursula Alert, MD 04/02/2019, 5:02 PM

## 2019-04-27 ENCOUNTER — Other Ambulatory Visit: Payer: Self-pay

## 2019-04-27 ENCOUNTER — Telehealth: Payer: Self-pay

## 2019-04-27 ENCOUNTER — Ambulatory Visit (INDEPENDENT_AMBULATORY_CARE_PROVIDER_SITE_OTHER): Payer: Self-pay | Admitting: Psychiatry

## 2019-04-27 ENCOUNTER — Encounter: Payer: Self-pay | Admitting: Psychiatry

## 2019-04-27 DIAGNOSIS — Z9114 Patient's other noncompliance with medication regimen: Secondary | ICD-10-CM

## 2019-04-27 DIAGNOSIS — F411 Generalized anxiety disorder: Secondary | ICD-10-CM

## 2019-04-27 DIAGNOSIS — Z634 Disappearance and death of family member: Secondary | ICD-10-CM

## 2019-04-27 DIAGNOSIS — F5105 Insomnia due to other mental disorder: Secondary | ICD-10-CM

## 2019-04-27 DIAGNOSIS — F330 Major depressive disorder, recurrent, mild: Secondary | ICD-10-CM

## 2019-04-27 MED ORDER — DULOXETINE HCL 60 MG PO CPEP: 60.0000 mg | ORAL_CAPSULE | Freq: Every day | ORAL | 0 refills | Status: DC

## 2019-04-27 MED ORDER — AMITRIPTYLINE HCL 25 MG PO TABS: 25.0000 mg | ORAL_TABLET | Freq: Every day | ORAL | 1 refills | Status: DC

## 2019-04-27 NOTE — Progress Notes (Signed)
Provider Location : [facility] Patient Location : Home Virtual Visit via Telephone Note  I connected with Lorraine Hutchinson on 04/27/19 at  1:40 PM EDT by telephone and verified that I am speaking with the correct person using two identifiers.   I discussed the limitations, risks, security and privacy concerns of performing an evaluation and management service by telephone and the availability of in person appointments. I also discussed with the patient that there may be a patient responsible charge related to this service. The patient expressed understanding and agreed to proceed.     I discussed the assessment and treatment plan with the patient. The patient was provided an opportunity to ask questions and all were answered. The patient agreed with the plan and demonstrated an understanding of the instructions.   The patient was advised to call back or seek an in-person evaluation if the symptoms worsen or if the condition fails to improve as anticipated.  [facility] MD OP Progress Note  04/27/2019 5:01 PM Lorraine Hutchinson  MRN:  [id number]  Chief Complaint:  Chief Complaint    Follow-up     HPI: Lorraine Hutchinson is a 59 year old Caucasian female, widowed, lives in [place], has a history of MDD, GAD, insomnia, OSA on CPAP, chronic pain, morbid obesity, cervical cancer per history was evaluated by phone today.  Patient preferred to do a phone call.  Patient today reports she is currently struggling with sleep issues.  She reports the Belsomra is too expensive for her.  She  reports  she feels tired during the day since she does not sleep at night.  There are days when she sleeps for 4 hours during the day to catch up on her sleep.  She also struggles with sadness, crying spells and other depressive symptoms, reports the symptoms as worsening since the past few weeks.  Patient denies any suicidality, homicidality or perceptual disturbances.  Patient is compliant on her medications as prescribed.  Patient denies  any side effects to her medications. Visit Diagnosis:    ICD-10-CM   1. MDD (major depressive disorder), recurrent episode, mild (HCC)  F33.0 DULoxetine (CYMBALTA) 60 MG capsule    amitriptyline (ELAVIL) 25 MG tablet  2. Generalized anxiety disorder  F41.1 amitriptyline (ELAVIL) 25 MG tablet  3. Insomnia due to mental condition  F51.05 amitriptyline (ELAVIL) 25 MG tablet  4. Bereavement  Z63.4   5. Noncompliance with medication regimen  Z91.14     Past Psychiatric History: I have reviewed past psychiatric history from my progress note on 04/16/2017.  Past trials of medications like Elavil, Seroquel, Pamelor, Ambien, trazodone, melatonin, Johnnye Sima, Sonata  Past Medical History:  Past Medical History:  Diagnosis Date  . Anxiety   . Cancer (HCC)    cervical  . Depression   . Diabetes mellitus without complication ([place])   . Hyperlipidemia   . Hypertension   . Scoliosis   . Sleep apnea     Past Surgical History:  Procedure Laterality Date  . ABDOMINAL HYSTERECTOMY    . APPENDECTOMY    . CESAREAN SECTION    . REPLACEMENT TOTAL KNEE Right   . TONSILLECTOMY      Family Psychiatric History: I have reviewed family psychiatric history from my progress note on 04/16/2017  Family History:  Family History  Problem Relation Age of Onset  . Diabetes Father   . Diabetes Mother   . Cancer Mother   . Anxiety disorder Sister   . Depression Sister     Social History:  I have reviewed social history from my progress note on 04/16/2017 Social History   Socioeconomic History  . Marital status: Widowed    Spouse name: arthur  . Number of children: 2  . Years of education: Not on file  . Highest education level: 11th grade  Occupational History  . Not on file  Tobacco Use  . Smoking status: Former Smoker    Packs/day: 1.00    Years: 40.00    Pack years: 40.00    Types: Cigarettes    Quit date: 08/15/2017    Years since quitting: 1.6  . Smokeless tobacco: Never Used  Substance and  Sexual Activity  . Alcohol use: No    Alcohol/week: 0.0 standard drinks  . Drug use: Yes    Types: Marijuana  . Sexual activity: Not Currently  Other Topics Concern  . Not on file  Social History Narrative  . Not on file   Social Determinants of Health   Financial Resource Strain:   . Difficulty of Paying Living Expenses:   Food Insecurity:   . Worried About Charity fundraiser in the Last Year:   . Arboriculturist in the Last Year:   Transportation Needs:   . Film/video editor (Medical):   Marland Kitchen Lack of Transportation (Non-Medical):   Physical Activity:   . Days of Exercise per Week:   . Minutes of Exercise per Session:   Stress:   . Feeling of Stress :   Social Connections:   . Frequency of Communication with Friends and Family:   . Frequency of Social Gatherings with Friends and Family:   . Attends Religious Services:   . Active Member of Clubs or Organizations:   . Attends Archivist Meetings:   Marland Kitchen Marital Status:     Allergies:  Allergies  Allergen Reactions  . Codeine Hives    Metabolic Disorder Labs: No results found for: HGBA1C, MPG No results found for: PROLACTIN No results found for: CHOL, TRIG, HDL, CHOLHDL, VLDL, LDLCALC No results found for: TSH  Therapeutic Level Labs: No results found for: LITHIUM No results found for: VALPROATE No components found for:  CBMZ  Current Medications: Current Outpatient Medications  Medication Sig Dispense Refill  . Alpha-Lipoic Acid 100 MG CAPS Take by mouth daily.     Marland Kitchen amitriptyline (ELAVIL) 25 MG tablet Take 1 tablet (25 mg total) by mouth at bedtime. 30 tablet 1  . B Complex-Biotin-FA (TH VITAMIN B 50/B-COMPLEX) TABS Take 1 tablet by mouth daily.    . B-D UF III MINI PEN NEEDLES 31G X 5 MM MISC U UTD 2 XD  3  . buPROPion (WELLBUTRIN) 75 MG tablet Take 1 tablet (75 mg total) by mouth daily with breakfast. 90 tablet 0  . busPIRone (BUSPAR) 10 MG tablet Take 1 tablet (10 mg total) by mouth 2 (two)  times daily. 180 tablet 0  . Calcium Carbonate-Vitamin D 600-200 MG-UNIT TABS Take by mouth.    . Calcium-Vitamin D 600-200 MG-UNIT tablet Take 1 tablet by mouth 2 (two) times daily.    . Cinnamon 500 MG capsule Take 2,000 mg by mouth daily.     . cyclobenzaprine (FLEXERIL) 10 MG tablet Take by mouth.    . diclofenac (VOLTAREN) 75 MG EC tablet Take 75 mg by mouth 2 (two) times daily.    Marland Kitchen diltiazem (DILACOR XR) 180 MG 24 hr capsule Take 180 mg by mouth 2 (two) times daily.    Marland Kitchen diltiazem (TIAZAC) 180  MG 24 hr capsule Take by mouth.    . DULoxetine (CYMBALTA) 60 MG capsule Take 1 capsule (60 mg total) by mouth daily. 90 capsule 0  . furosemide (LASIX) 20 MG tablet Take by mouth.    . hydrOXYzine (VISTARIL) 25 MG capsule 1 capsule 3 times a day as needed 270 capsule 1  . insulin aspart protamine - aspart (NOVOLOG MIX 70/30 FLEXPEN) (70-30) 100 UNIT/ML FlexPen Inject 60 Units into the skin daily before breakfast. 62 units before supper    . lisinopril-hydrochlorothiazide (PRINZIDE,ZESTORETIC) 20-25 MG tablet Take 1 tablet by mouth daily.    . metFORMIN (GLUCOPHAGE) 500 MG tablet TAKE 2 TABLETS BY MOUTH TWICE A DAY WITH MEALS    . Multiple Vitamins-Minerals (ABC PLUS SENIOR ADULTS 50+ PO) Take by mouth.    Marland Kitchen omeprazole (PRILOSEC) 20 MG capsule Take 20 mg by mouth daily.    . Potassium 99 MG TABS Take 1 tablet by mouth daily.    . pravastatin (PRAVACHOL) 40 MG tablet Take 40 mg by mouth daily.     No current facility-administered medications for this visit.     Musculoskeletal: Strength & Muscle Tone: UTA Gait & Station: UTA Patient leans: N/A  Psychiatric Specialty Exam: Review of Systems  Psychiatric/Behavioral: Positive for dysphoric mood and sleep disturbance. The patient is nervous/anxious.   All other systems reviewed and are negative.   There were no vitals taken for this visit.There is no height or weight on file to calculate BMI.  General Appearance: UTA  Eye Contact:  UTA   Speech:  Clear and Coherent  Volume:  Normal  Mood:  Anxious, Depressed and Dysphoric  Affect:  UTA  Thought Process:  Goal Directed and Descriptions of Associations: Intact  Orientation:  Full (Time, Place, and Person)  Thought Content: Logical   Suicidal Thoughts:  No  Homicidal Thoughts:  No  Memory:  Immediate;   Fair Recent;   Fair Remote;   Fair  Judgement:  Fair  Insight:  Fair  Psychomotor Activity:  UTA  Concentration:  Concentration: Fair and Attention Span: Fair  Recall:  [employer] of Knowledge: Fair  Language: Fair  Akathisia:  No  Handed:  Right  AIMS (if indicated): UTA  Assets:  Communication Skills Desire for Improvement Housing Social Support  ADL's:  Intact  Cognition: WNL  Sleep:  Poor   Screenings: PHQ2-9     Office Visit from 12/03/2016 in [facility] Procedure visit from 11/15/2016 in [facility] Office Visit from 10/24/2016 in [facility] Office Visit from 09/26/2016 in [facility] Nutrition from 07/07/2015 in [facility]  PHQ-2 Total Score  0  0  0  0  6  PHQ-9 Total Score  -  -  -  -  15       Assessment and Plan: Lorraine Hutchinson is a 59 year old Caucasian female, widowed, lives in [place], has a history of MDD, GAD, insomnia was evaluated by phone today.  Patient is currently struggling with depressive symptoms as well as sleep issues.  Patient does have psychosocial stressors of her grief from losing her husband since his death anniversary is this time of the year. Patient will continue to benefit from medication readjustment.  Patient however has declined psychotherapy sessions.  Plan as noted below.  Plan MDD recurrent mild-unstable Wellbutrin 75 mg p.o. daily Taper off Cymbalta.  We will  reduce Cymbalta to 60 mg p.o. daily BuSpar 10 mg p.o. twice daily. Start  Elavil 25 mg p.o. nightly.  The long-term plan is to completely taper her off of the Cymbalta if she responds well to the Elavil.  GAD-stable BuSpar as prescribed Cymbalta 60 mg p.o. daily-reduced dosage Hydroxyzine 25 mg p.o. 3 times daily as needed for anxiety attacks  Insomnia-unstable Discontinue Belsomra for noncompliance due to cost. Start Elavil 25 mg p.o. nightly  Bereavement-unstable Patient was offered referral to psychotherapy sessions however she declined.  We will monitor closely  Follow-up in clinic in 2 to 3 weeks or sooner if needed.  I have spent atleast 20 minutes non- face to face with patient today. More than 50 % of the time was spent for preparing to see the patient ( e.g., review of test, records ),ordering medications and test ,psychoeducation and supportive psychotherapy and care coordination,as well as documenting clinical information in electronic health record. This note was generated in part or whole with voice recognition software. Voice recognition is usually quite accurate but there are transcription errors that can and very often do occur. I apologize for any typographical errors that were not detected and corrected.         Ursula Alert, MD 04/27/2019, 5:01 PM

## 2019-04-27 NOTE — Telephone Encounter (Signed)
Shawn could you please let them know patient is being weaned off of cymbalta and was asked to take 60 mg daily for now, her previous dosage 120 mg . We will monitor.  Thanks

## 2019-04-27 NOTE — Telephone Encounter (Signed)
Medication management - Telephone call from [place], pharmacist at pt's [facility] questioning filling Amitriptyline with Duloxetine and Buspirone due to a potential severe drug interaction.  Collateral requests verification to fill before he will dispense

## 2019-04-27 NOTE — Telephone Encounter (Signed)
Medication management - Telephone call with pharmacist back at pt's [facility] pharmacy to inform of Dr. Charlcie Cradle report pt is weaning off Cymbalta and currently going down to 60 mg so coul fill Amitriptyline as pt is aware of changes and plans for stopping.

## 2019-05-04 DIAGNOSIS — E876 Hypokalemia: Secondary | ICD-10-CM | POA: Insufficient documentation

## 2019-05-04 DIAGNOSIS — R809 Proteinuria, unspecified: Secondary | ICD-10-CM | POA: Insufficient documentation

## 2019-05-04 DIAGNOSIS — N1832 Chronic kidney disease, stage 3b: Secondary | ICD-10-CM | POA: Insufficient documentation

## 2019-05-04 DIAGNOSIS — N390 Urinary tract infection, site not specified: Secondary | ICD-10-CM | POA: Insufficient documentation

## 2019-05-04 DIAGNOSIS — I129 Hypertensive chronic kidney disease with stage 1 through stage 4 chronic kidney disease, or unspecified chronic kidney disease: Secondary | ICD-10-CM | POA: Insufficient documentation

## 2019-05-05 ENCOUNTER — Other Ambulatory Visit: Payer: Self-pay | Admitting: Nephrology

## 2019-05-05 ENCOUNTER — Other Ambulatory Visit (HOSPITAL_COMMUNITY): Payer: Self-pay | Admitting: Nephrology

## 2019-05-05 DIAGNOSIS — R809 Proteinuria, unspecified: Secondary | ICD-10-CM

## 2019-05-05 DIAGNOSIS — E876 Hypokalemia: Secondary | ICD-10-CM

## 2019-05-05 DIAGNOSIS — N1832 Chronic kidney disease, stage 3b: Secondary | ICD-10-CM

## 2019-05-14 ENCOUNTER — Ambulatory Visit
Admission: RE | Admit: 2019-05-14 | Discharge: 2019-05-14 | Disposition: A | Discharge status: Home / Self Care | Payer: 59 | Source: Ambulatory Visit | Attending: Nephrology | Admitting: Nephrology

## 2019-05-14 ENCOUNTER — Other Ambulatory Visit: Payer: Self-pay

## 2019-05-14 DIAGNOSIS — R809 Proteinuria, unspecified: Secondary | ICD-10-CM | POA: Insufficient documentation

## 2019-05-14 DIAGNOSIS — E876 Hypokalemia: Secondary | ICD-10-CM | POA: Diagnosis present

## 2019-05-14 DIAGNOSIS — N1832 Chronic kidney disease, stage 3b: Secondary | ICD-10-CM | POA: Diagnosis present

## 2019-05-15 ENCOUNTER — Telehealth (INDEPENDENT_AMBULATORY_CARE_PROVIDER_SITE_OTHER): Payer: 59 | Admitting: Psychiatry

## 2019-05-15 DIAGNOSIS — Z91199 Patient's noncompliance with other medical treatment and regimen due to unspecified reason: Secondary | ICD-10-CM | POA: Insufficient documentation

## 2019-05-15 DIAGNOSIS — Z5329 Procedure and treatment not carried out because of patient's decision for other reasons: Secondary | ICD-10-CM

## 2019-05-15 NOTE — Progress Notes (Signed)
No response to call, attempted several times. No response to video invite.

## 2019-06-13 ENCOUNTER — Other Ambulatory Visit: Payer: Self-pay | Admitting: Psychiatry

## 2019-06-13 DIAGNOSIS — F411 Generalized anxiety disorder: Secondary | ICD-10-CM

## 2019-08-15 IMAGING — MR MR LUMBAR SPINE W/O CM
4 of 5 series · 25 of 48 positions shown · non-contrast
Comparison: MRI lumbar spine dated July 25, 2006.

CLINICAL DATA: Low back pain radiating into the right leg.

EXAM:
MRI LUMBAR SPINE WITHOUT CONTRAST
TECHNIQUE: Multiplanar, multisequence MR imaging of the lumbar spine was
performed. No intravenous contrast was administered.

[Series 3: T2 post-contrast · sagittal · 4.0mm · 0.55mm/px · 6 of 16 slices shown]
[im 1/16]
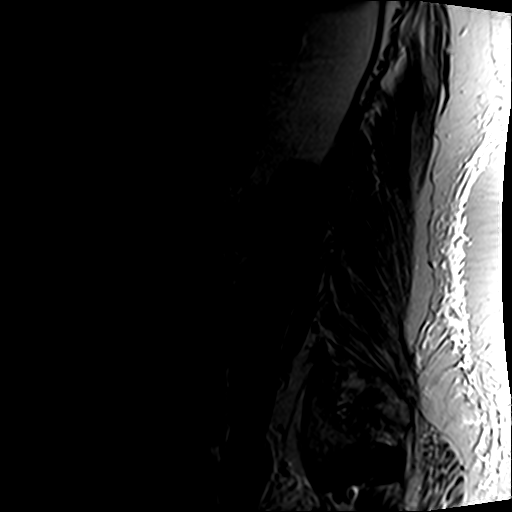
[im 4/16]
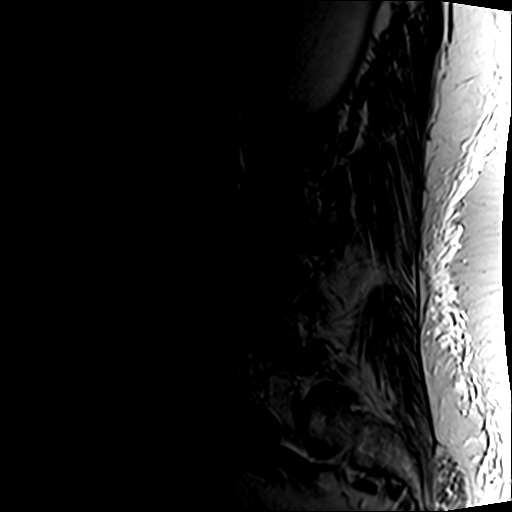
[im 7/16]
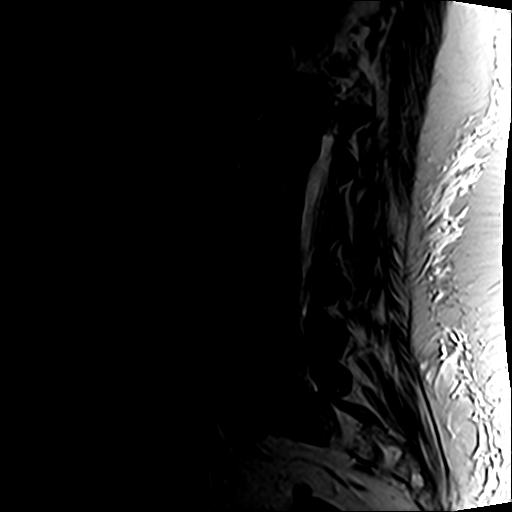
[im 10/16]
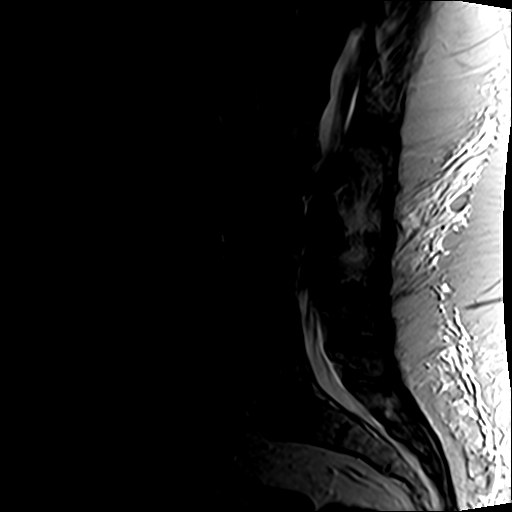
[im 13/16]
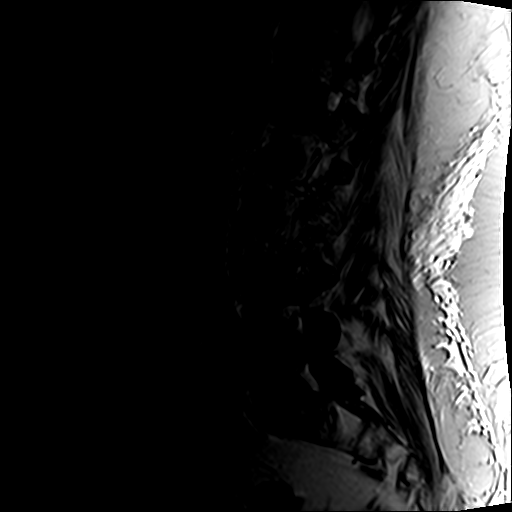
[im 16/16]
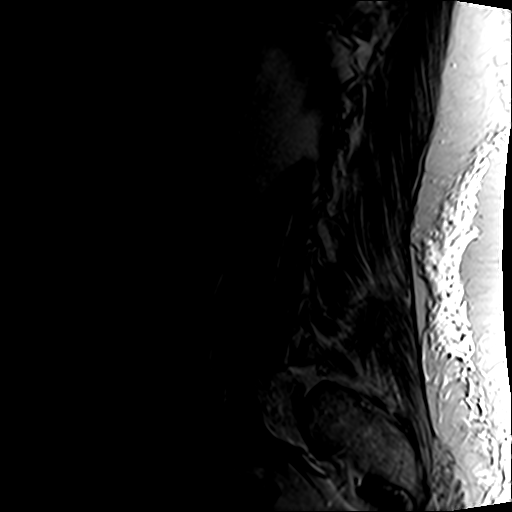

[Series 5: T1 · sagittal · 4.0mm · 0.55mm/px · 6 of 16 slices shown (1 of 2)]
[im 1/16]
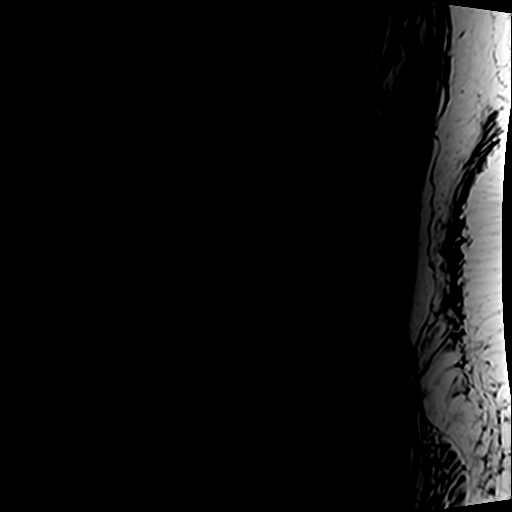
[im 4/16]
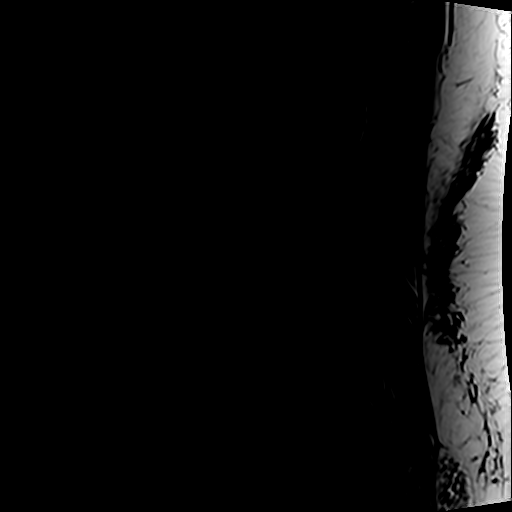
[im 7/16]
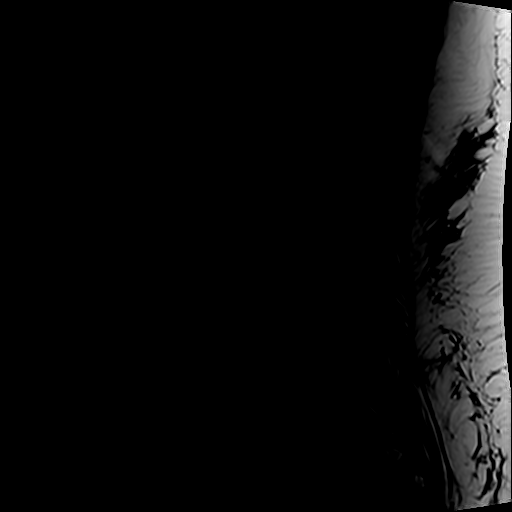
[im 10/16]
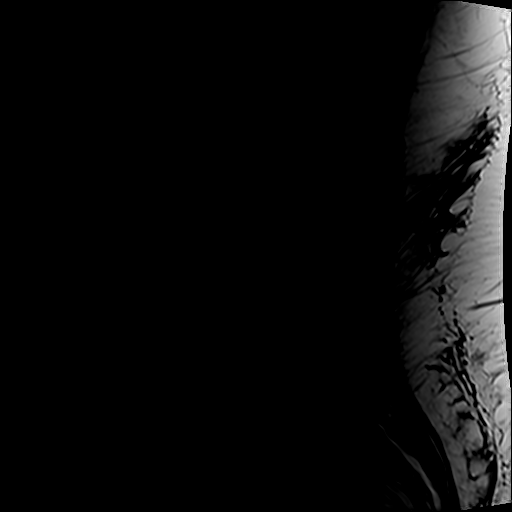
[im 13/16]
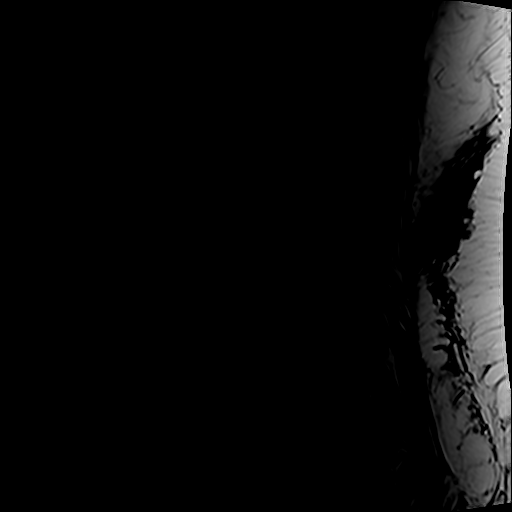
[im 16/16]
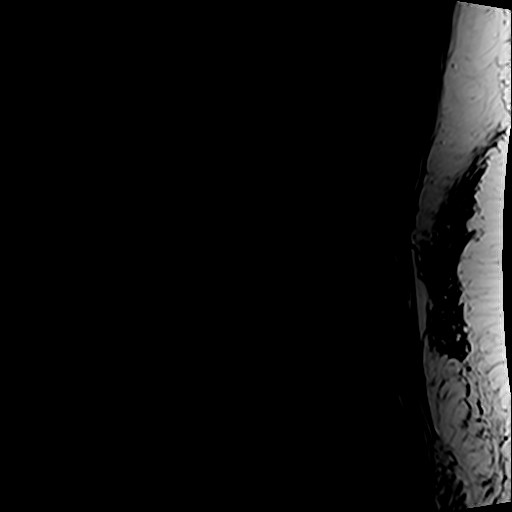

[Series 6: T1 · axial · 4.0mm · 0.35mm/px · z∈[-94,+95]mm · 4 of 39 slices shown (2 of 2)]
[im 1/39]
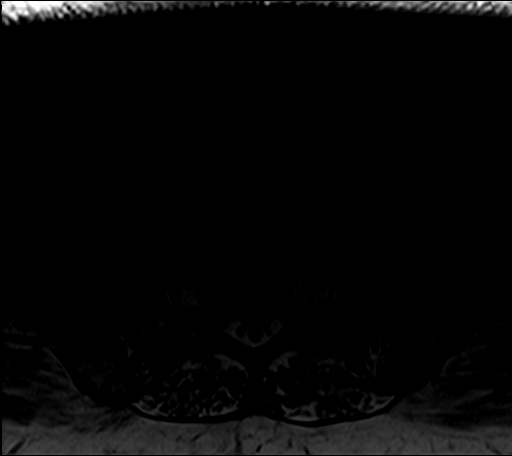
[im 6/39]
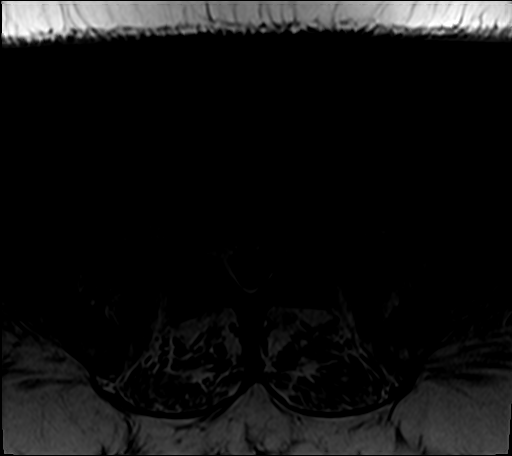
[im 20/39]
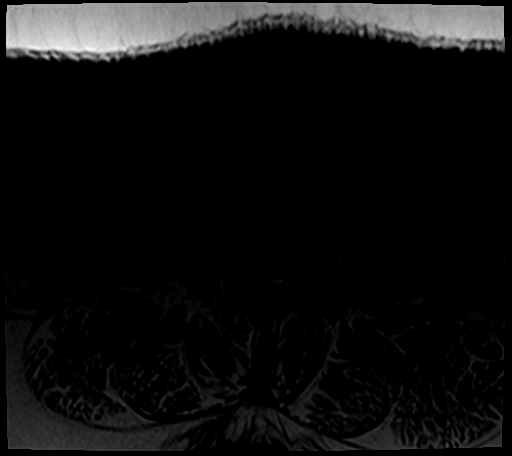
[im 33/39]
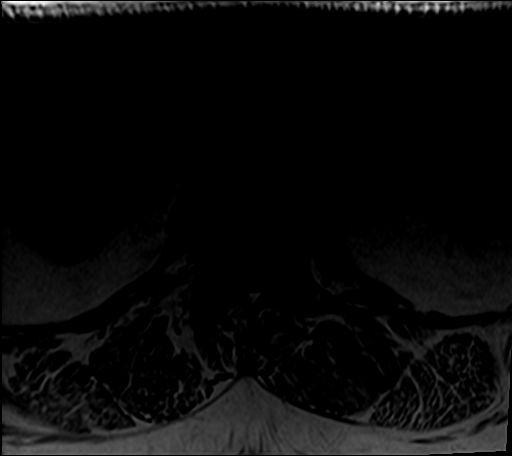

[Series 7: T2 · axial · 4.0mm · 0.70mm/px · z∈[-94,+126]mm · 9 of 39 slices shown]
[im 1/39]
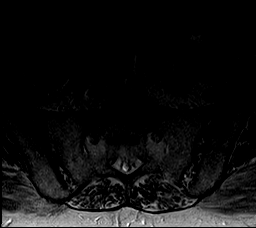
[im 6/39]
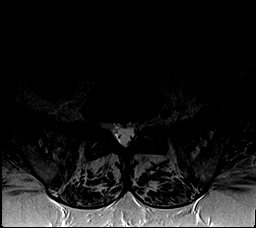
[im 11/39]
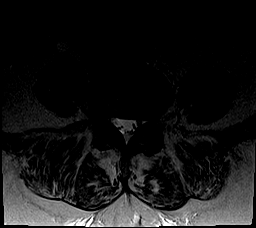
[im 17/39]
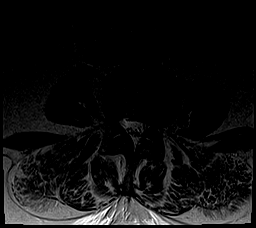
[im 20/39]
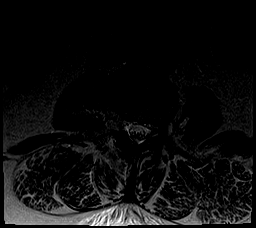
[im 22/39]
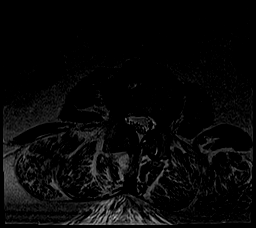
[im 28/39]
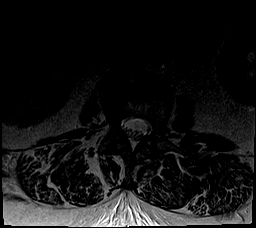
[im 33/39]
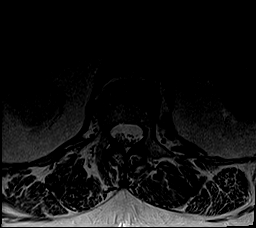
[im 39/39]
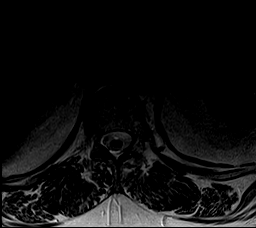

[25 of 48 positions shown; findings below may reference images not displayed]

FINDINGS: Segmentation:  Standard.

Alignment: Unchanged levoscoliosis centered at L2-L3. Sagittal
alignment is maintained.

Vertebrae: No fracture, evidence of discitis, or bone lesion.
Unchanged hemangiomas in the L1 and L3 vertebral bodies.

Conus medullaris: Extends to the L1 level and appears normal.

Paraspinal and other soft tissues: Negative.

Disc levels:

T11-T12:  Only seen on the sagittal images.  Negative.

T12-L1: Small central disc protrusion without spinal canal or
neuroforaminal stenosis.

L1-L2: Right foraminal disc osteophyte complex and mild left facet
arthropathy, slightly progressed when compared to prior study. No
spinal canal or neuroforaminal stenosis.

L2-L3: Small right foraminal disc osteophyte complex and mild to
moderate bilateral facet arthropathy. Prominent right ligamentum
flavum hypertrophy. Mild right lateral recess narrowing and
neuroforaminal stenosis, worsened when compared to prior study. No
spinal canal or left neuroforaminal stenosis.

L3-L4: Small diffuse disc bulge and mild bilateral facet
arthropathy. Mild right neuroforaminal stenosis, new when compared
to prior study. No significant central spinal canal or left
neuroforaminal stenosis.

L4-L5: Small diffuse disc bulge and mild bilateral facet arthropathy
without significant spinal canal or neuroforaminal stenosis.

L5-S1: Mild bilateral facet arthropathy. No spinal canal or
neuroforaminal stenosis.
IMPRESSION: 1. Mild multilevel degenerative changes of the lumbar spine, worst
at L2-L3, progressed when compared to prior study. Mild right
neuroforaminal stenosis at L2-L3 and L3-L4. No definite evidence of
neural impingement.

## 2019-08-26 ENCOUNTER — Other Ambulatory Visit: Payer: Self-pay | Admitting: Psychiatry

## 2019-08-26 DIAGNOSIS — F3342 Major depressive disorder, recurrent, in full remission: Secondary | ICD-10-CM

## 2019-09-01 ENCOUNTER — Telehealth: Payer: Self-pay

## 2019-09-01 DIAGNOSIS — F411 Generalized anxiety disorder: Secondary | ICD-10-CM

## 2019-09-01 MED ORDER — BUSPIRONE HCL 10 MG PO TABS: 10.0000 mg | ORAL_TABLET | Freq: Two times a day (BID) | ORAL | 0 refills | Status: DC

## 2019-09-01 NOTE — Telephone Encounter (Signed)
pt called left message that she needs refills on her medications.

## 2019-09-01 NOTE — Telephone Encounter (Signed)
I had sent a refill on Buspar 10 mg BID, Wellbutrin refill was sent by Dr. Shea Evans early this month and I am not sure if pt continues to take Elavil since she never followed up with Dr. Shea Evans after she was put on Elavil. Will send this message to Dr. Shea Evans to address this further on her return tomorrow.

## 2019-09-02 NOTE — Telephone Encounter (Signed)
Attempted to reach patient back to discuss medication problems.  Left voicemail for patient to call us back.

## 2019-09-22 ENCOUNTER — Telehealth: Payer: Self-pay | Admitting: Psychiatry

## 2019-09-22 NOTE — Telephone Encounter (Signed)
Unable to complete call.  Attempted to reach patient.

## 2019-10-01 ENCOUNTER — Telehealth (INDEPENDENT_AMBULATORY_CARE_PROVIDER_SITE_OTHER): Payer: 59 | Admitting: Psychiatry

## 2019-10-01 ENCOUNTER — Other Ambulatory Visit: Payer: Self-pay

## 2019-10-01 ENCOUNTER — Telehealth: Payer: 59 | Admitting: Psychiatry

## 2019-10-01 ENCOUNTER — Encounter: Payer: Self-pay | Admitting: Psychiatry

## 2019-10-01 DIAGNOSIS — F5105 Insomnia due to other mental disorder: Secondary | ICD-10-CM | POA: Diagnosis not present

## 2019-10-01 DIAGNOSIS — F3342 Major depressive disorder, recurrent, in full remission: Secondary | ICD-10-CM | POA: Diagnosis not present

## 2019-10-01 DIAGNOSIS — F411 Generalized anxiety disorder: Secondary | ICD-10-CM

## 2019-10-01 DIAGNOSIS — Z634 Disappearance and death of family member: Secondary | ICD-10-CM

## 2019-10-01 DIAGNOSIS — F330 Major depressive disorder, recurrent, mild: Secondary | ICD-10-CM | POA: Insufficient documentation

## 2019-10-01 MED ORDER — DULOXETINE HCL 60 MG PO CPEP: 60.0000 mg | ORAL_CAPSULE | Freq: Every day | ORAL | 1 refills | Status: DC

## 2019-10-01 MED ORDER — BUSPIRONE HCL 10 MG PO TABS: 10.0000 mg | ORAL_TABLET | Freq: Two times a day (BID) | ORAL | 1 refills | Status: DC

## 2019-10-01 MED ORDER — BUPROPION HCL 75 MG PO TABS: 75.0000 mg | ORAL_TABLET | Freq: Every day | ORAL | 1 refills | Status: DC

## 2019-10-01 NOTE — Progress Notes (Signed)
Provider Location : [facility] Patient Location : Home  Participants: Patient , Provider  Virtual Visit via Telephone Note  I connected with Lorraine Hutchinson on 10/01/19 at  4:40 PM EDT by telephone and verified that I am speaking with the correct person using two identifiers.   I discussed the limitations, risks, security and privacy concerns of performing an evaluation and management service by telephone and the availability of in person appointments. I also discussed with the patient that there may be a patient responsible charge related to this service. The patient expressed understanding and agreed to proceed.       I discussed the assessment and treatment plan with the patient. The patient was provided an opportunity to ask questions and all were answered. The patient agreed with the plan and demonstrated an understanding of the instructions.   The patient was advised to call back or seek an in-person evaluation if the symptoms worsen or if the condition fails to improve as anticipated.   [facility] MD OP Progress Note  10/01/2019 5:07 PM Lorraine Hutchinson  MRN:  [id number]  Chief Complaint:  Chief Complaint    Follow-up     HPI: Lorraine Hutchinson is a 59 year old Caucasian female, widowed, lives in [place], has a history of MDD, GAD, insomnia, OSA on CPAP, chronic pain, morbid obesity, cervical cancer per history was evaluated by phone today.    Patient today reports she is currently doing well.  She denies any significant mood lability.  She denies any significant anxiety symptoms.  She however does report that she does not go out much because she is socially anxious.  She however is able to do what she needs to do like going to the grocery store and go to her family's and friends place.  She however reports that is limited.  She is not interested in psychotherapy sessions.  She reports sleep is good without the medication.  She denies any suicidality, homicidality or perceptual  disturbances.  Patient is compliant on medications on an anti-Elavil which was prescribed for sleep.  She denies side effects.  Patient denies any other concerns today.  Visit Diagnosis:    ICD-10-CM   1. MDD (major depressive disorder), recurrent, in full remission ([place])  F33.42 DULoxetine (CYMBALTA) 60 MG capsule  2. Generalized anxiety disorder  F41.1 busPIRone (BUSPAR) 10 MG tablet   STABLE  3. Insomnia due to mental condition  F51.05   4. Bereavement  Z63.4 buPROPion (WELLBUTRIN) 75 MG tablet    Past Psychiatric History: I have reviewed past psychiatric history from my progress note on 04/16/2017.  Past trials of medications like Elavil, Seroquel, Pamelor, Ambien, trazodone, melatonin, Johnnye Sima, Sonata  Past Medical History:  Past Medical History:  Diagnosis Date  . Anxiety   . Cancer (HCC)    cervical  . Depression   . Diabetes mellitus without complication ([place])   . Hyperlipidemia   . Hypertension   . Scoliosis   . Sleep apnea     Past Surgical History:  Procedure Laterality Date  . ABDOMINAL HYSTERECTOMY    . APPENDECTOMY    . CESAREAN SECTION    . REPLACEMENT TOTAL KNEE Right   . TONSILLECTOMY      Family Psychiatric History: I have reviewed family psychiatric history from my progress note on 04/16/2017  Family History:  Family History  Problem Relation Age of Onset  . Diabetes Father   . Diabetes Mother   . Cancer Mother   . Anxiety disorder Sister   .  Depression Sister     Social History: I have reviewed social history from my progress note on 04/16/2017 Social History   Socioeconomic History  . Marital status: Widowed    Spouse name: Lorraine Hutchinson  . Number of children: 2  . Years of education: Not on file  . Highest education level: 11th grade  Occupational History  . Not on file  Tobacco Use  . Smoking status: Former Smoker    Packs/day: 1.00    Years: 40.00    Pack years: 40.00    Types: Cigarettes    Quit date: 08/15/2017    Years since quitting:  2.1  . Smokeless tobacco: Never Used  Vaping Use  . Vaping Use: Never used  Substance and Sexual Activity  . Alcohol use: No    Alcohol/week: 0.0 standard drinks  . Drug use: Yes    Types: Marijuana  . Sexual activity: Not Currently  Other Topics Concern  . Not on file  Social History Narrative  . Not on file   Social Determinants of Health   Financial Resource Strain:   . Difficulty of Paying Living Expenses: Not on file  Food Insecurity:   . Worried About Charity fundraiser in the Last Year: Not on file  . Ran Out of Food in the Last Year: Not on file  Transportation Needs:   . Lack of Transportation (Medical): Not on file  . Lack of Transportation (Non-Medical): Not on file  Physical Activity:   . Days of Exercise per Week: Not on file  . Minutes of Exercise per Session: Not on file  Stress:   . Feeling of Stress : Not on file  Social Connections:   . Frequency of Communication with Friends and Family: Not on file  . Frequency of Social Gatherings with Friends and Family: Not on file  . Attends Religious Services: Not on file  . Active Member of Clubs or Organizations: Not on file  . Attends Archivist Meetings: Not on file  . Marital Status: Not on file    Allergies:  Allergies  Allergen Reactions  . Codeine Hives    Metabolic Disorder Labs: No results found for: HGBA1C, MPG No results found for: PROLACTIN No results found for: CHOL, TRIG, HDL, CHOLHDL, VLDL, LDLCALC No results found for: TSH  Therapeutic Level Labs: No results found for: LITHIUM No results found for: VALPROATE No components found for:  CBMZ  Current Medications: Current Outpatient Medications  Medication Sig Dispense Refill  . albuterol (VENTOLIN HFA) 108 (90 Base) MCG/ACT inhaler Inhale into the lungs.    . Alpha-Lipoic Acid 100 MG CAPS Take by mouth daily.     . B Complex Vitamins (VITAMIN B COMPLEX) TABS Take by mouth.    . B Complex-Biotin-FA (TH VITAMIN B  50/B-COMPLEX) TABS Take 1 tablet by mouth daily.    . B-D UF III MINI PEN NEEDLES 31G X 5 MM MISC U UTD 2 XD  3  . budesonide-formoterol (SYMBICORT) 160-4.5 MCG/ACT inhaler Inhale into the lungs.    Marland Kitchen buPROPion (WELLBUTRIN) 75 MG tablet Take 1 tablet (75 mg total) by mouth daily with breakfast. 90 tablet 1  . busPIRone (BUSPAR) 10 MG tablet Take 1 tablet (10 mg total) by mouth 2 (two) times daily. 180 tablet 1  . Calcium Carbonate-Vitamin D 600-200 MG-UNIT TABS Take by mouth.    . Calcium-Vitamin D 600-200 MG-UNIT tablet Take 1 tablet by mouth 2 (two) times daily.    . Cinnamon 500  MG capsule Take 2,000 mg by mouth daily.     . cyclobenzaprine (FLEXERIL) 10 MG tablet Take by mouth.    . dextromethorphan-guaiFENesin (MUCINEX DM) 30-600 MG 12hr tablet Take by mouth.    . diltiazem (DILACOR XR) 180 MG 24 hr capsule Take 180 mg by mouth 2 (two) times daily.    . DULoxetine (CYMBALTA) 60 MG capsule Take 1 capsule (60 mg total) by mouth daily. 90 capsule 1  . furosemide (LASIX) 20 MG tablet Take by mouth.    . hydrOXYzine (VISTARIL) 25 MG capsule 1 capsule 3 times a day as needed 270 capsule 1  . insulin aspart protamine - aspart (NOVOLOG MIX 70/30 FLEXPEN) (70-30) 100 UNIT/ML FlexPen Inject 60 Units into the skin daily before breakfast. 62 units before supper    . lisinopril-hydrochlorothiazide (PRINZIDE,ZESTORETIC) 20-25 MG tablet Take 1 tablet by mouth daily.    . metFORMIN (GLUCOPHAGE) 500 MG tablet TAKE 2 TABLETS BY MOUTH TWICE A DAY WITH MEALS    . Multiple Vitamins-Minerals (ABC PLUS SENIOR ADULTS 50+ PO) Take by mouth.    Marland Kitchen omeprazole (PRILOSEC) 20 MG capsule Take 20 mg by mouth daily.    . pravastatin (PRAVACHOL) 40 MG tablet Take 40 mg by mouth daily.    . TRELEGY ELLIPTA 100-62.5-25 MCG/INH AEPB Inhale 1 puff into the lungs daily.    Marland Kitchen albuterol (VENTOLIN HFA) 108 (90 Base) MCG/ACT inhaler Inhale into the lungs. (Patient not taking: Reported on 10/01/2019)    . azithromycin (ZITHROMAX) 250  MG tablet Take 250 mg by mouth as directed. (Patient not taking: Reported on 10/01/2019)    . ciprofloxacin (CIPRO) 500 MG tablet Take 500 mg by mouth 2 (two) times daily. (Patient not taking: Reported on 10/01/2019)    . diclofenac (VOLTAREN) 75 MG EC tablet Take 75 mg by mouth 2 (two) times daily. (Patient not taking: Reported on 10/01/2019)    . diltiazem (TIAZAC) 180 MG 24 hr capsule Take by mouth. (Patient not taking: Reported on 10/01/2019)    . Potassium 99 MG TABS Take 1 tablet by mouth daily. (Patient not taking: Reported on 10/01/2019)    . predniSONE (DELTASONE) 20 MG tablet Take 20 mg by mouth 2 (two) times daily. (Patient not taking: Reported on 10/01/2019)     No current facility-administered medications for this visit.     Musculoskeletal: Strength & Muscle Tone: UTA Gait & Station: UTA Patient leans: N/A  Psychiatric Specialty Exam: Review of Systems  Musculoskeletal: Positive for back pain.  Psychiatric/Behavioral: The patient is nervous/anxious.   All other systems reviewed and are negative.   There were no vitals taken for this visit.There is no height or weight on file to calculate BMI.  General Appearance: UTA  Eye Contact:  UTA  Speech:  Clear and Coherent  Volume:  Normal  Mood:  Anxious  Affect:  UTA  Thought Process:  Goal Directed and Descriptions of Associations: Intact  Orientation:  Full (Time, Place, and Person)  Thought Content: Logical   Suicidal Thoughts:  No  Homicidal Thoughts:  No  Memory:  Immediate;   Fair Recent;   Fair Remote;   Fair  Judgement:  Fair  Insight:  Fair  Psychomotor Activity:  UTA  Concentration:  Concentration: Fair and Attention Span: Fair  Recall:  [employer] of Knowledge: Fair  Language: Fair  Akathisia:  No  Handed:  Right  AIMS (if indicated): UTA  Assets:  Communication Skills Desire for Improvement Housing Social Support  ADL's:  Intact  Cognition: WNL  Sleep:  Fair   Screenings: PHQ2-9     Office Visit  from 12/03/2016 in [facility] Procedure visit from 11/15/2016 in [facility] Office Visit from 10/24/2016 in [facility] Office Visit from 09/26/2016 in [facility] Nutrition from 07/07/2015 in [facility]  PHQ-2 Total Score 0 0 0 0 6  PHQ-9 Total Score -- -- -- -- 15       Assessment and Plan: Lorraine Hutchinson is a 59 year old Caucasian female, widowed, lives in [place], has a history of MDD, GAD, insomnia was evaluated by phone today.  Patient is currently stable on current medication regimen.  Plan as noted below.  Plan MDD in remission Wellbutrin 75 mg p.o. daily Cymbalta 60 mg p.o. daily BuSpar 10 mg p.o. twice daily.  GAD-stable BuSpar as prescribed Cymbalta 60 mg p.o. daily-reduced dosage. Hydroxyzine 25 mg p.o. 3 times daily as needed for anxiety attacks.  Insomnia-stable She will continue to work on sleep hygiene techniques. Discontinue Elavil for noncompliance.  Bereavement-stable We will monitor closely.  She is not interested in psychotherapy sessions.  Follow-up in clinic in 3 months or sooner if needed.  I have spent atleast 20 minutes non face to face with patient today. More than 50 % of the time was spent for preparing to see the patient ( e.g., review of test, records ), ordering medications and test ,psychoeducation and supportive psychotherapy and care coordination,as well as documenting clinical information in electronic health record. This note was generated in part or whole with voice recognition software. Voice recognition is usually quite accurate but there are transcription errors that can and very often do occur. I apologize for any typographical errors that were not detected and corrected.        Ursula Alert, MD 10/01/2019, 5:07 PM

## 2019-11-03 ENCOUNTER — Other Ambulatory Visit: Payer: Self-pay | Admitting: Psychiatry

## 2019-11-03 DIAGNOSIS — F411 Generalized anxiety disorder: Secondary | ICD-10-CM

## 2019-11-17 ENCOUNTER — Other Ambulatory Visit: Payer: Self-pay | Admitting: Psychiatry

## 2019-11-17 DIAGNOSIS — F411 Generalized anxiety disorder: Secondary | ICD-10-CM

## 2019-12-30 ENCOUNTER — Ambulatory Visit: Payer: 59 | Attending: Neurology

## 2019-12-30 DIAGNOSIS — G4733 Obstructive sleep apnea (adult) (pediatric): Secondary | ICD-10-CM | POA: Diagnosis present

## 2019-12-30 DIAGNOSIS — G4761 Periodic limb movement disorder: Secondary | ICD-10-CM | POA: Diagnosis not present

## 2020-01-01 ENCOUNTER — Other Ambulatory Visit: Payer: Self-pay

## 2020-01-06 ENCOUNTER — Telehealth (INDEPENDENT_AMBULATORY_CARE_PROVIDER_SITE_OTHER): Payer: 59 | Admitting: Psychiatry

## 2020-01-06 ENCOUNTER — Encounter: Payer: Self-pay | Admitting: Psychiatry

## 2020-01-06 ENCOUNTER — Other Ambulatory Visit: Payer: Self-pay

## 2020-01-06 DIAGNOSIS — F411 Generalized anxiety disorder: Secondary | ICD-10-CM

## 2020-01-06 DIAGNOSIS — F5105 Insomnia due to other mental disorder: Secondary | ICD-10-CM

## 2020-01-06 DIAGNOSIS — F330 Major depressive disorder, recurrent, mild: Secondary | ICD-10-CM | POA: Diagnosis not present

## 2020-01-06 MED ORDER — BUPROPION HCL ER (XL) 150 MG PO TB24: 150.0000 mg | ORAL_TABLET | Freq: Every morning | ORAL | 0 refills | Status: DC

## 2020-01-06 NOTE — Progress Notes (Signed)
Virtual Visit via Telephone Note  I connected with Lorraine Hutchinson on 01/06/20 at  4:40 PM EST by telephone and verified that I am speaking with the correct person using two identifiers.  Location Provider Location : [facility] Patient Location : Home  Participants: Patient , Provider   I discussed the limitations, risks, security and privacy concerns of performing an evaluation and management service by telephone and the availability of in person appointments. I also discussed with the patient that there may be a patient responsible charge related to this service. The patient expressed understanding and agreed to proceed.    I discussed the assessment and treatment plan with the patient. The patient was provided an opportunity to ask questions and all were answered. The patient agreed with the plan and demonstrated an understanding of the instructions.   The patient was advised to call back or seek an in-person evaluation if the symptoms worsen or if the condition fails to improve as anticipated.   [facility] MD OP Progress Note  01/06/2020 6:08 PM Lorraine Hutchinson  MRN:  [id number]  Chief Complaint:  Chief Complaint    Follow-up     HPI: Lorraine Hutchinson is a 59 year old Caucasian female, widowed, lives in [place], has a history of MDD, GAD, insomnia, OSA on CPAP, chronic pain, morbid obesity, history of cervical cancer was evaluated by phone today.  Patient reports she is currently struggling with depressive symptoms, mostly grief since it is the holiday season.  She reports she misses her husband a lot.  Her husband passed away .  She reports it was her husband's birthday last week and that caused her to be more sad.  Patient struggles with lack of motivation, low energy, sadness, crying spell.  She is compliant on all her medications.  Denies side effects.   She wants to continue to make use of her support system. Her daughter is a huge support for her.  She is not interested in psychotherapy  sessions.  Patient reports she had trouble with her CPAP device.  She reports she was waking up feeling very sluggish and tired and was not getting any sleep.  She however reports she has been back to her provider and she had another sleep study done.  She is currently waiting for her new CPAP device.  Patient reports sleep continues to be restless.  She is currently not on any sleep medication.  Patient denies any suicidality, homicidality, or perceptual disturbances.  Patient denies any other concerns today.  Visit Diagnosis:    ICD-10-CM   1. MDD (major depressive disorder), recurrent episode, mild (HCC)  F33.0 buPROPion (WELLBUTRIN XL) 150 MG 24 hr tablet  2. Generalized anxiety disorder  F41.1   3. Insomnia due to mental condition  F51.05     Past Psychiatric History: I have reviewed past psychiatric history from my progress note on 04/16/2017.  Past trials of medications like Elavil, Seroquel, Pamelor, Ambien, trazodone, melatonin, Johnnye Sima, Sonata  Past Medical History:  Past Medical History:  Diagnosis Date   Anxiety    Cancer ([place])    cervical   Depression    Diabetes mellitus without complication ([place])    Hyperlipidemia    Hypertension    Scoliosis    Sleep apnea     Past Surgical History:  Procedure Laterality Date   ABDOMINAL HYSTERECTOMY     APPENDECTOMY     CESAREAN SECTION     REPLACEMENT TOTAL KNEE Right    TONSILLECTOMY  Family Psychiatric History: I have reviewed family psychiatric history from my progress note on 04/16/2017.  Family History:  Family History  Problem Relation Age of Onset   Diabetes Father    Diabetes Mother    Cancer Mother    Anxiety disorder Sister    Depression Sister     Social History: I have reviewed social history from my progress note on 04/16/2017. Social History   Socioeconomic History   Marital status: Widowed    Spouse name: arthur   Number of children: 2   Years of education: Not on file    Highest education level: 11th grade  Occupational History   Not on file  Tobacco Use   Smoking status: Former Smoker    Packs/day: 1.00    Years: 40.00    Pack years: 40.00    Types: Cigarettes    Quit date: 08/15/2017    Years since quitting: 2.3   Smokeless tobacco: Never Used  Vaping Use   Vaping Use: Never used  Substance and Sexual Activity   Alcohol use: No    Alcohol/week: 0.0 standard drinks   Drug use: Yes    Types: Marijuana   Sexual activity: Not Currently  Other Topics Concern   Not on file  Social History Narrative   Not on file   Social Determinants of Health   Financial Resource Strain: Not on file  Food Insecurity: Not on file  Transportation Needs: Not on file  Physical Activity: Not on file  Stress: Not on file  Social Connections: Not on file    Allergies:  Allergies  Allergen Reactions   Codeine Hives    Metabolic Disorder Labs: No results found for: HGBA1C, MPG No results found for: PROLACTIN No results found for: CHOL, TRIG, HDL, CHOLHDL, VLDL, LDLCALC No results found for: TSH  Therapeutic Level Labs: No results found for: LITHIUM No results found for: VALPROATE No components found for:  CBMZ  Current Medications: Current Outpatient Medications  Medication Sig Dispense Refill   albuterol (VENTOLIN HFA) 108 (90 Base) MCG/ACT inhaler Inhale into the lungs. (Patient not taking: Reported on 10/01/2019)     albuterol (VENTOLIN HFA) 108 (90 Base) MCG/ACT inhaler Inhale into the lungs.     Alpha-Lipoic Acid 100 MG CAPS Take by mouth daily.      azithromycin (ZITHROMAX) 250 MG tablet Take 250 mg by mouth as directed. (Patient not taking: Reported on 10/01/2019)     B Complex Vitamins (VITAMIN B COMPLEX) TABS Take by mouth.     B Complex-Biotin-FA (TH VITAMIN B 50/B-COMPLEX) TABS Take 1 tablet by mouth daily.     B-D UF III MINI PEN NEEDLES 31G X 5 MM MISC U UTD 2 XD  3   budesonide-formoterol (SYMBICORT) 160-4.5 MCG/ACT  inhaler Inhale into the lungs.     buPROPion (WELLBUTRIN XL) 150 MG 24 hr tablet Take 1 tablet (150 mg total) by mouth every morning. 90 tablet 0   busPIRone (BUSPAR) 10 MG tablet Take 1 tablet (10 mg total) by mouth 2 (two) times daily. 180 tablet 1   Calcium Carbonate-Vitamin D 600-200 MG-UNIT TABS Take by mouth.     Calcium-Vitamin D 600-200 MG-UNIT tablet Take 1 tablet by mouth 2 (two) times daily.     Cinnamon 500 MG capsule Take 2,000 mg by mouth daily.      ciprofloxacin (CIPRO) 500 MG tablet Take 500 mg by mouth 2 (two) times daily. (Patient not taking: Reported on 10/01/2019)     cyclobenzaprine (  FLEXERIL) 10 MG tablet Take by mouth.     dextromethorphan-guaiFENesin (MUCINEX DM) 30-600 MG 12hr tablet Take by mouth.     diclofenac (VOLTAREN) 75 MG EC tablet Take 75 mg by mouth 2 (two) times daily. (Patient not taking: Reported on 10/01/2019)     diltiazem (DILACOR XR) 180 MG 24 hr capsule Take 180 mg by mouth 2 (two) times daily.     diltiazem (TIAZAC) 180 MG 24 hr capsule Take by mouth. (Patient not taking: Reported on 10/01/2019)     DULoxetine (CYMBALTA) 60 MG capsule Take 1 capsule (60 mg total) by mouth daily. 90 capsule 1   furosemide (LASIX) 20 MG tablet Take by mouth.     hydrOXYzine (VISTARIL) 25 MG capsule Take 1 capsule (25 mg total) by mouth daily as needed for anxiety. TAKE 1 CAPSULE BY MOUTH 3 TIMES A DAY AS NEEDED 90 capsule 1   insulin aspart protamine - aspart (NOVOLOG MIX 70/30 FLEXPEN) (70-30) 100 UNIT/ML FlexPen Inject 60 Units into the skin daily before breakfast. 62 units before supper     lisinopril-hydrochlorothiazide (PRINZIDE,ZESTORETIC) 20-25 MG tablet Take 1 tablet by mouth daily.     metFORMIN (GLUCOPHAGE) 500 MG tablet TAKE 2 TABLETS BY MOUTH TWICE A DAY WITH MEALS     Multiple Vitamins-Minerals (ABC PLUS SENIOR ADULTS 50+ PO) Take by mouth.     omeprazole (PRILOSEC) 20 MG capsule Take 20 mg by mouth daily.     Potassium 99 MG TABS Take 1  tablet by mouth daily. (Patient not taking: Reported on 10/01/2019)     pravastatin (PRAVACHOL) 40 MG tablet Take 40 mg by mouth daily.     predniSONE (DELTASONE) 20 MG tablet Take 20 mg by mouth 2 (two) times daily. (Patient not taking: Reported on 10/01/2019)     TRELEGY ELLIPTA 100-62.5-25 MCG/INH AEPB Inhale 1 puff into the lungs daily.     No current facility-administered medications for this visit.     Musculoskeletal: Strength & Muscle Tone: UTA Gait & Station: UTA Patient leans: N/A  Psychiatric Specialty Exam: Review of Systems  Psychiatric/Behavioral: Positive for dysphoric mood.  All other systems reviewed and are negative.   There were no vitals taken for this visit.There is no height or weight on file to calculate BMI.  General Appearance: UTA  Eye Contact:  UTA  Speech:  Clear and Coherent  Volume:  Normal  Mood:  Dysphoric  Affect:  UTA  Thought Process:  Goal Directed and Descriptions of Associations: Intact  Orientation:  Full (Time, Place, and Person)  Thought Content: Logical   Suicidal Thoughts:  No  Homicidal Thoughts:  No  Memory:  Immediate;   Fair Recent;   Fair Remote;   Fair  Judgement:  Fair  Insight:  Fair  Psychomotor Activity:  UTA  Concentration:  Concentration: Fair and Attention Span: Fair  Recall:  [employer] of Knowledge: Fair  Language: Fair  Akathisia:  No  Handed:  Right  AIMS (if indicated): UTA  Assets:  Communication Skills Desire for Improvement Housing Social Support  ADL's:  Intact  Cognition: WNL  Sleep:  Restless   Screenings: PHQ2-9   [facility] Office Visit from 12/03/2016 in [facility] Procedure visit from 11/15/2016 in [facility] Office Visit from 10/24/2016 in [facility] Office Visit from 09/26/2016 in [facility] Nutrition from  07/07/2015 in [facility]  [facility]  PHQ-2 Total Score 0 0 0 0 6  PHQ-9 Total Score -- -- -- -- 15       Assessment and Plan: Lorraine Hutchinson is a 59 year old Caucasian female, widowed, lives in [place], has a history of MDD, GAD, insomnia was evaluated by telemedicine today.  Patient is currently grieving the loss of her husband since it is the holiday season which is contributing to worsening depressive symptoms.  We will make the following change.  Plan MDD -unstable Increase Wellbutrin XL to 150 mg p.o. daily Cymbalta 60 mg p.o. daily BuSpar 10 mg p.o. twice daily.  GAD-stable BuSpar as prescribed Cymbalta 60 mg p.o. daily-reduced dosage Hydroxyzine 25 mg p.o. 3 times daily as needed for anxiety attacks  Insomnia-unstable Patient had another sleep study done and is currently awaiting a new CPAP device. Continue sleep hygiene techniques Once she start using her new CPAP device and if she continues to have sleep trouble, we will consider adding a sleep medication.  Patient was prescribed sleep medications in the past however has been noncompliant.   Follow-up in clinic in 1 month or sooner if needed.  I have spent atleast 20 minutes non face to face  with patient today. More than 50 % of the time was spent for preparing to see the patient ( e.g., review of test, records ), ordering medications and test ,psychoeducation and supportive psychotherapy and care coordination,as well as documenting clinical information in electronic health record. This note was generated in part or whole with voice recognition software. Voice recognition is usually quite accurate but there are transcription errors that can and very often do occur. I apologize for any typographical errors that were not detected and corrected.        Ursula Alert, MD 01/07/2020, 10:01 AM

## 2020-01-13 ENCOUNTER — Telehealth: Payer: 59 | Admitting: Psychiatry

## 2020-01-21 ENCOUNTER — Other Ambulatory Visit: Payer: Self-pay | Admitting: Psychiatry

## 2020-01-21 DIAGNOSIS — F330 Major depressive disorder, recurrent, mild: Secondary | ICD-10-CM

## 2020-01-22 ENCOUNTER — Other Ambulatory Visit: Payer: Self-pay | Admitting: Psychiatry

## 2020-01-22 DIAGNOSIS — F330 Major depressive disorder, recurrent, mild: Secondary | ICD-10-CM

## 2020-01-23 ENCOUNTER — Other Ambulatory Visit: Payer: Self-pay | Admitting: Psychiatry

## 2020-01-23 DIAGNOSIS — F411 Generalized anxiety disorder: Secondary | ICD-10-CM

## 2020-02-08 ENCOUNTER — Telehealth (INDEPENDENT_AMBULATORY_CARE_PROVIDER_SITE_OTHER): Payer: 59 | Admitting: Psychiatry

## 2020-02-08 ENCOUNTER — Other Ambulatory Visit: Payer: Self-pay

## 2020-02-08 DIAGNOSIS — Z5329 Procedure and treatment not carried out because of patient's decision for other reasons: Secondary | ICD-10-CM

## 2020-02-08 NOTE — Progress Notes (Signed)
No response to call or text or video invite.  Left voicemail.  

## 2020-02-18 ENCOUNTER — Other Ambulatory Visit: Payer: Self-pay | Admitting: Nephrology

## 2020-02-18 DIAGNOSIS — E1122 Type 2 diabetes mellitus with diabetic chronic kidney disease: Secondary | ICD-10-CM

## 2020-02-18 DIAGNOSIS — N179 Acute kidney failure, unspecified: Secondary | ICD-10-CM

## 2020-02-18 DIAGNOSIS — N1832 Chronic kidney disease, stage 3b: Secondary | ICD-10-CM

## 2020-02-25 DIAGNOSIS — R9431 Abnormal electrocardiogram [ECG] [EKG]: Secondary | ICD-10-CM | POA: Insufficient documentation

## 2020-03-11 ENCOUNTER — Telehealth: Payer: 59 | Admitting: Psychiatry

## 2020-03-17 ENCOUNTER — Telehealth (INDEPENDENT_AMBULATORY_CARE_PROVIDER_SITE_OTHER): Payer: 59 | Admitting: Psychiatry

## 2020-03-17 ENCOUNTER — Other Ambulatory Visit: Payer: Self-pay

## 2020-03-17 ENCOUNTER — Encounter: Payer: Self-pay | Admitting: Psychiatry

## 2020-03-17 DIAGNOSIS — F5105 Insomnia due to other mental disorder: Secondary | ICD-10-CM

## 2020-03-17 DIAGNOSIS — R9431 Abnormal electrocardiogram [ECG] [EKG]: Secondary | ICD-10-CM | POA: Diagnosis not present

## 2020-03-17 DIAGNOSIS — F330 Major depressive disorder, recurrent, mild: Secondary | ICD-10-CM | POA: Diagnosis not present

## 2020-03-17 DIAGNOSIS — F411 Generalized anxiety disorder: Secondary | ICD-10-CM

## 2020-03-17 MED ORDER — CLONAZEPAM 0.5 MG PO TABS: 0.5000 mg | ORAL_TABLET | ORAL | 0 refills | Status: DC

## 2020-03-17 NOTE — Progress Notes (Signed)
Virtual Visit via Telephone Note  I connected with Lorraine Hutchinson on 03/17/20 at  3:00 PM EST by telephone and verified that I am speaking with the correct person using two identifiers.  Location Provider Location : [facility] Patient Location : Home  Participants: Patient , Provider    I discussed the limitations, risks, security and privacy concerns of performing an evaluation and management service by telephone and the availability of in person appointments. I also discussed with the patient that there may be a patient responsible charge related to this service. The patient expressed understanding and agreed to proceed.    I discussed the assessment and treatment plan with the patient. The patient was provided an opportunity to ask questions and all were answered. The patient agreed with the plan and demonstrated an understanding of the instructions.   The patient was advised to call back or seek an in-person evaluation if the symptoms worsen or if the condition fails to improve as anticipated.   [facility] MD OP Progress Note  03/17/2020 5:35 PM Lorraine Hutchinson  MRN:  [id number]  Chief Complaint:  Chief Complaint    Follow-up; Anxiety; Depression     HPI: Lorraine Hutchinson is a 60 year old Caucasian female, widowed, lives in [place], has a history of MDD, GAD, insomnia, OSA on CPAP, chronic pain, morbid obesity was evaluated by telemedicine today.  Patient today reports that she had recent EKG changes and her medications were recently discontinued by her cardiologist as well as nephrologist.  Patient was not sure about what medications were discontinued.  However writer was able to review notes in [facility] dated 02/18/2020 per nephrologist-Dr. Wynelle Link as well as notes per cardiology-Ms.Dessie Coma PA -notes dated 03/08/2020.  Per review of notes patient's nephrologist discontinued metformin, gabapentin as well as lisinopril-hydrochlorothiazide.  Per cardiology patient had recent EKG prolongation QTC-650.   Patient was advised to stop the hydroxyzine and the other medications were continued.'  Patient however today reports she had a second EKG done.  However writer was unable to review this EKG on file.  Patient does report recent sadness, anxiety about her recent health issues.  She does struggle with fatigue and tiredness during the day.  She also reports sleep problems on a regular basis.  Patient however reports she just got her CPAP and is planning to start using it.  Patient denies any suicidality, homicidality or perceptual disturbances.  Patient denies any other concerns today.  Visit Diagnosis:    ICD-10-CM   1. MDD (major depressive disorder), recurrent episode, mild (HCC)  F33.0   2. Generalized anxiety disorder  F41.1 clonazePAM (KLONOPIN) 0.5 MG tablet  3. Insomnia due to mental condition  F51.05   4. Prolonged Q-T interval on ECG  R94.31     Past Psychiatric History: I have reviewed past psychiatric history from my progress note on 04/16/2017.  Past trials of medications like Elavil, Seroquel, Pamelor, Ambien, trazodone, melatonin, Alfonso Patten, Sonata  Past Medical History:  Past Medical History:  Diagnosis Date  . Anxiety   . Cancer (HCC)    cervical  . Depression   . Diabetes mellitus without complication (HCC)   . Hyperlipidemia   . Hypertension   . Scoliosis   . Sleep apnea     Past Surgical History:  Procedure Laterality Date  . ABDOMINAL HYSTERECTOMY    . APPENDECTOMY    . CESAREAN SECTION    . REPLACEMENT TOTAL KNEE Right   . TONSILLECTOMY      Family  Psychiatric History: I have reviewed family psychiatric history from my progress note on 04/16/2017.  Family History:  Family History  Problem Relation Age of Onset  . Diabetes Father   . Diabetes Mother   . Cancer Mother   . Anxiety disorder Sister   . Depression Sister     Social History: I have reviewed social history from my progress note on 04/16/2017. Social History   Socioeconomic History  .  Marital status: Widowed    Spouse name: arthur  . Number of children: 2  . Years of education: Not on file  . Highest education level: 11th grade  Occupational History  . Not on file  Tobacco Use  . Smoking status: Former Smoker    Packs/day: 1.00    Years: 40.00    Pack years: 40.00    Types: Cigarettes    Quit date: 08/15/2017    Years since quitting: 2.5  . Smokeless tobacco: Never Used  Vaping Use  . Vaping Use: Never used  Substance and Sexual Activity  . Alcohol use: No    Alcohol/week: 0.0 standard drinks  . Drug use: Yes    Types: Marijuana  . Sexual activity: Not Currently  Other Topics Concern  . Not on file  Social History Narrative  . Not on file   Social Determinants of Health   Financial Resource Strain: Not on file  Food Insecurity: Not on file  Transportation Needs: Not on file  Physical Activity: Not on file  Stress: Not on file  Social Connections: Not on file    Allergies:  Allergies  Allergen Reactions  . Codeine Hives    Metabolic Disorder Labs: No results found for: HGBA1C, MPG No results found for: PROLACTIN No results found for: CHOL, TRIG, HDL, CHOLHDL, VLDL, LDLCALC No results found for: TSH  Therapeutic Level Labs: No results found for: LITHIUM No results found for: VALPROATE No components found for:  CBMZ  Current Medications: Current Outpatient Medications  Medication Sig Dispense Refill  . albuterol (VENTOLIN HFA) 108 (90 Base) MCG/ACT inhaler Inhale into the lungs.    Marland Kitchen albuterol (VENTOLIN HFA) 108 (90 Base) MCG/ACT inhaler Inhale into the lungs.    . B Complex Vitamins (VITAMIN B COMPLEX) TABS Take by mouth.    . B Complex-Biotin-FA (TH VITAMIN B 50/B-COMPLEX) TABS Take 1 tablet by mouth daily.    . B-D UF III MINI PEN NEEDLES 31G X 5 MM MISC U UTD 2 XD  3  . buPROPion (WELLBUTRIN XL) 150 MG 24 hr tablet TAKE 1 TABLET BY MOUTH EVERY DAY IN THE MORNING 90 tablet 0  . busPIRone (BUSPAR) 10 MG tablet Take 1 tablet (10 mg  total) by mouth 2 (two) times daily. 180 tablet 1  . Calcium Carbonate-Vitamin D 600-200 MG-UNIT TABS Take by mouth.    . Calcium-Vitamin D 600-200 MG-UNIT tablet Take 1 tablet by mouth 2 (two) times daily.    . Cinnamon 500 MG capsule Take 2,000 mg by mouth daily.     . ciprofloxacin (CIPRO) 500 MG tablet Take 500 mg by mouth 2 (two) times daily.    . clonazePAM (KLONOPIN) 0.5 MG tablet Take 1 tablet (0.5 mg total) by mouth as directed. Start taking 2-3 times a week for severe anxiety attacks only 21 tablet 0  . diltiazem (DILACOR XR) 180 MG 24 hr capsule Take 180 mg by mouth 2 (two) times daily.    . DULoxetine (CYMBALTA) 60 MG capsule Take 1 capsule (60 mg total)  by mouth daily. 90 capsule 1  . insulin aspart protamine - aspart (NOVOLOG 70/30 MIX) (70-30) 100 UNIT/ML FlexPen Inject 60 Units into the skin daily before breakfast. 62 units before supper    . Multiple Vitamins-Minerals (ABC PLUS SENIOR ADULTS 50+ PO) Take by mouth.    Marland Kitchen omeprazole (PRILOSEC) 20 MG capsule Take 20 mg by mouth daily.    . Potassium 99 MG TABS Take 1 tablet by mouth daily.    Marland Kitchen torsemide (DEMADEX) 10 MG tablet Take by mouth.    . TRELEGY ELLIPTA 100-62.5-25 MCG/INH AEPB Inhale 1 puff into the lungs daily.    . furosemide (LASIX) 20 MG tablet Take by mouth.    . pravastatin (PRAVACHOL) 40 MG tablet Take 40 mg by mouth daily. (Patient not taking: Reported on 03/17/2020)    . predniSONE (DELTASONE) 20 MG tablet Take 20 mg by mouth 2 (two) times daily. (Patient not taking: No sig reported)    . pregabalin (LYRICA) 25 MG capsule Take by mouth. (Patient not taking: Reported on 03/17/2020)     No current facility-administered medications for this visit.     Musculoskeletal: Strength & Muscle Tone: UTA Gait & Station: UTA Patient leans: N/A  Psychiatric Specialty Exam: Review of Systems  Cardiovascular: Positive for leg swelling.  Psychiatric/Behavioral: Positive for dysphoric mood and sleep disturbance. The patient  is nervous/anxious.   All other systems reviewed and are negative.   There were no vitals taken for this visit.There is no height or weight on file to calculate BMI.  General Appearance: UTA  Eye Contact:  UTA  Speech:  Clear and Coherent  Volume:  Normal  Mood:  Anxious and Dysphoric  Affect:  UTA  Thought Process:  Goal Directed and Descriptions of Associations: Intact  Orientation:  Full (Time, Place, and Person)  Thought Content: Logical   Suicidal Thoughts:  No  Homicidal Thoughts:  No  Memory:  Immediate;   Fair Recent;   Fair Remote;   Fair  Judgement:  Fair  Insight:  Fair  Psychomotor Activity:  UTA  Concentration:  Concentration: Fair and Attention Span: Fair  Recall:  [employer] of Knowledge: Fair  Language: Fair  Akathisia:  No  Handed:  Right  AIMS (if indicated): UTA  Assets:  Communication Skills Desire for Improvement Housing Social Support  ADL's:  Intact  Cognition: WNL  Sleep:  Poor   Screenings: PHQ2-9   Flowsheet Row Video Visit from 03/17/2020 in [facility] Office Visit from 12/03/2016 in [facility] Procedure visit from 11/15/2016 in [facility] Office Visit from 10/24/2016 in [facility] Office Visit from 09/26/2016 in [facility]  PHQ-2 Total Score 3 0 0 0 0  PHQ-9 Total Score 10 -- -- -- --    Flowsheet Row Video Visit from 03/17/2020 in [facility]  C-SSRS RISK CATEGORY No Risk       Assessment and Plan: Lorraine Hutchinson is a 60 year old Caucasian female, widowed, lives in [place], has a history of MDD, GAD, insomnia was evaluated by telemedicine today.  Patient is currently struggling with medical problems including recent prolongation of QT on her EKG.  Patient had recent medication readjustment.  Patient with current  depressive symptoms as well as anxiety symptoms however will not make further medication changes without cardiology clearance.  This was discussed with patient.  Plan MDD-unstable  PHQ 9 today equals 10 Continue Wellbutrin XL 150 mg p.o. daily for now. Cymbalta 60 mg p.o. daily BuSpar 10 mg p.o. twice daily Discussed with patient that we will need cardiology clearance to continue these above medications as well as to make further changes.  She will get in touch with her cardiologist.  GAD-unstable BuSpar as prescribed Cymbalta as prescribed Discontinue hydroxyzine-this was recently discontinued by cardiology. Start Klonopin 0.5 mg as needed 2-3 times a week only for severe anxiety attacks.  Patient advised to limit use.  Insomnia-unstable Patient recently started using CPAP-will give it more time. If she continues to have sleep problems she will benefit from medication readjustment.  Prolonged QT syndrome-patient advised to sign a release so we can get medical records including most recent EKG from cardiology. Patient also advised to talk to cardiologist for clearance on staying on these above medications.  She will also need cardiology clearance for further medication changes. I have reviewed EKG dated 02/25/2020-650-prolonged.  Patient to sign a release to obtain medical records including repeat EKG.  Otherwise will need to repeat EKG here and this was discussed with patient.  Follow-up in clinic in person in 3 to 4 weeks.   I have spent atleast 30 minutes with patient today which includes the time spent for preparing to see the patient ( e.g., review of test, records ), obtaining and to review and separately obtained history , ordering medications and test ,psychoeducation and supportive psychotherapy and care coordination,as well as documenting clinical information in electronic health record,interpreting and communication of test results   This note was generated in part or whole  with voice recognition software. Voice recognition is usually quite accurate but there are transcription errors that can and very often do occur. I apologize for any typographical errors that were not detected and corrected.       Jomarie Longs, MD 03/18/2020, 9:12 AM

## 2020-03-17 NOTE — Patient Instructions (Signed)
Clonazepam tablets What is this medicine? CLONAZEPAM (kloe NA ze pam) is a benzodiazepine. It is used to treat certain types of seizures. It is also used to treat panic disorder. This medicine may be used for other purposes; ask your health care provider or pharmacist if you have questions. COMMON BRAND NAME(S): Ceberclon, Klonopin What should I tell my health care provider before I take this medicine? They need to know if you have any of these conditions:  an alcohol or drug abuse problem  bipolar disorder, depression, psychosis or other mental health condition  glaucoma  kidney or liver disease  lung or breathing disease  myasthenia gravis  Parkinson's disease  porphyria  seizures or a history of seizures  suicidal thoughts  an unusual or allergic reaction to clonazepam, other benzodiazepines, foods, dyes, or preservatives  pregnant or trying to get pregnant  breast-feeding How should I use this medicine? Take this medicine by mouth with a glass of water. Follow the directions on the prescription label. If it upsets your stomach, take it with food or milk. Take your medicine at regular intervals. Do not take it more often than directed. Do not stop taking or change the dose except on the advice of your doctor or health care professional. A special MedGuide will be given to you by the pharmacist with each prescription and refill. Be sure to read this information carefully each time. Talk to your pediatrician regarding the use of this medicine in children. Special care may be needed. Overdosage: If you think you have taken too much of this medicine contact a poison control center or emergency room at once. NOTE: This medicine is only for you. Do not share this medicine with others. What if I miss a dose? If you miss a dose, take it as soon as you can. If it is almost time for your next dose, take only that dose. Do not take double or extra doses. What may interact with this  medicine? Do not take this medication with any of the following medicines:  narcotic medicines for cough  sodium oxybate This medicine may also interact with the following medications:  alcohol  antihistamines for allergy, cough and cold  antiviral medicines for HIV or AIDS  certain medicines for anxiety or sleep  certain medicines for depression, like amitriptyline, fluoxetine, sertraline  certain medicines for fungal infections like ketoconazole and itraconazole  certain medicines for seizures like carbamazepine, phenobarbital, phenytoin, primidone  general anesthetics like halothane, isoflurane, methoxyflurane, propofol  local anesthetics like lidocaine, pramoxine, tetracaine  medicines that relax muscles for surgery  narcotic medicines for pain  phenothiazines like chlorpromazine, mesoridazine, prochlorperazine, thioridazine This list may not describe all possible interactions. Give your health care provider a list of all the medicines, herbs, non-prescription drugs, or dietary supplements you use. Also tell them if you smoke, drink alcohol, or use illegal drugs. Some items may interact with your medicine. What should I watch for while using this medicine? Tell your doctor or health care professional if your symptoms do not start to get better or if they get worse. Do not stop taking except on your doctor's advice. You may develop a severe reaction. Your doctor will tell you how much medicine to take. You may get drowsy or dizzy. Do not drive, use machinery, or do anything that needs mental alertness until you know how this medicine affects you. To reduce the risk of dizzy and fainting spells, do not stand or sit up quickly, especially if you are  an older patient. Alcohol may increase dizziness and drowsiness. Avoid alcoholic drinks. If you are taking another medicine that also causes drowsiness, you may have more side effects. Give your health care provider a list of all  medicines you use. Your doctor will tell you how much medicine to take. Do not take more medicine than directed. Call emergency for help if you have problems breathing or unusual sleepiness. The use of this medicine may increase the chance of suicidal thoughts or actions. Pay special attention to how you are responding while on this medicine. Any worsening of mood, or thoughts of suicide or dying should be reported to your health care professional right away. What side effects may I notice from receiving this medicine? Side effects that you should report to your doctor or health care professional as soon as possible:  allergic reactions like skin rash, itching or hives, swelling of the face, lips, or tongue  breathing problems  confusion  loss of balance or coordination  signs and symptoms of low blood pressure like dizziness; feeling faint or lightheaded, falls; unusually weak or tired  suicidal thoughts or mood changes Side effects that usually do not require medical attention (report to your doctor or health care professional if they continue or are bothersome):  dizziness  headache  tiredness  upset stomach This list may not describe all possible side effects. Call your doctor for medical advice about side effects. You may report side effects to FDA at 1-800-FDA-1088. Where should I keep my medicine? Keep out of the reach of children. This medicine can be abused. Keep your medicine in a safe place to protect it from theft. Do not share this medicine with anyone. Selling or giving away this medicine is dangerous and against the law. This medicine may cause accidental overdose and death if taken by other adults, children, or pets. Mix any unused medicine with a substance like cat litter or coffee grounds. Then throw the medicine away in a sealed container like a sealed bag or a coffee can with a lid. Do not use the medicine after the expiration date. Store at room temperature between  15 and 30 degrees C (59 and 86 degrees F). Protect from light. Keep container tightly closed. NOTE: This sheet is a summary. It may not cover all possible information. If you have questions about this medicine, talk to your doctor, pharmacist, or health care provider.  2021 Elsevier/Gold Standard (2015-06-10 18:46:32)

## 2020-03-18 ENCOUNTER — Telehealth: Payer: Self-pay

## 2020-03-18 ENCOUNTER — Telehealth: Payer: Self-pay | Admitting: Psychiatry

## 2020-03-18 ENCOUNTER — Ambulatory Visit
Admission: RE | Admit: 2020-03-18 | Discharge: 2020-03-18 | Disposition: A | Discharge status: Home / Self Care | Payer: 59 | Source: Ambulatory Visit | Attending: Nephrology | Admitting: Nephrology

## 2020-03-18 DIAGNOSIS — N1832 Chronic kidney disease, stage 3b: Secondary | ICD-10-CM | POA: Diagnosis present

## 2020-03-18 DIAGNOSIS — N179 Acute kidney failure, unspecified: Secondary | ICD-10-CM | POA: Diagnosis present

## 2020-03-18 DIAGNOSIS — F411 Generalized anxiety disorder: Secondary | ICD-10-CM

## 2020-03-18 DIAGNOSIS — F3342 Major depressive disorder, recurrent, in full remission: Secondary | ICD-10-CM

## 2020-03-18 DIAGNOSIS — E1122 Type 2 diabetes mellitus with diabetic chronic kidney disease: Secondary | ICD-10-CM | POA: Diagnosis present

## 2020-03-18 MED ORDER — DULOXETINE HCL 60 MG PO CPEP: 60.0000 mg | ORAL_CAPSULE | Freq: Every day | ORAL | 1 refills | Status: DC

## 2020-03-18 MED ORDER — BUSPIRONE HCL 10 MG PO TABS: 10.0000 mg | ORAL_TABLET | Freq: Two times a day (BID) | ORAL | 1 refills | Status: DC

## 2020-03-18 NOTE — Telephone Encounter (Signed)
Hi Dr. Shea Evans. This mutual patient called yesterday in regards to whether or not she was safe to continue her psych medications with recent ECG showing prolonged Qtc interval. After discontinuing hydroxyzine, she returned for repeat ECG, and Qtc interval was much improved to 455 ms. She should be safe to continue Cymbalta, Wellbutrin, and Buspar as these should not prolong the QT.  Received above message from Cardiology - Ms.Clabe Seal PA  Will refill cymbalta and buspar.

## 2020-03-18 NOTE — Telephone Encounter (Signed)
Patient was advised to get cardiology clearance yesterday prior to refills

## 2020-03-18 NOTE — Telephone Encounter (Signed)
DULoxetine (CYMBALTA) 60 MG capsule Medication Date: 10/01/2019 Department: [facility] Ordering/Authorizing: Ursula Alert, MD    Order Providers  Prescribing Provider Encounter Provider  Ursula Alert, MD Ursula Alert, MD   Outpatient Medication Detail   Disp Refills Start End   DULoxetine (CYMBALTA) 60 MG capsule 90 capsule 1 10/01/2019    Sig - Route: Take 1 capsule (60 mg total) by mouth daily. - Oral   Sent to pharmacy as: DULoxetine (CYMBALTA) 60 MG capsule   E-Prescribing Status: Receipt confirmed by pharmacy (10/01/2019 4:55 PM EDT)    Associated Diagnoses  MDD (major depressive disorder), recurrent, in full remission ([place]) - Primary

## 2020-04-07 DIAGNOSIS — R0609 Other forms of dyspnea: Secondary | ICD-10-CM | POA: Insufficient documentation

## 2020-04-07 DIAGNOSIS — R0600 Dyspnea, unspecified: Secondary | ICD-10-CM | POA: Insufficient documentation

## 2020-04-19 ENCOUNTER — Telehealth (INDEPENDENT_AMBULATORY_CARE_PROVIDER_SITE_OTHER): Payer: 59 | Admitting: Psychiatry

## 2020-04-19 ENCOUNTER — Other Ambulatory Visit: Payer: Self-pay

## 2020-04-19 ENCOUNTER — Encounter: Payer: Self-pay | Admitting: Psychiatry

## 2020-04-19 DIAGNOSIS — F330 Major depressive disorder, recurrent, mild: Secondary | ICD-10-CM

## 2020-04-19 DIAGNOSIS — G4701 Insomnia due to medical condition: Secondary | ICD-10-CM | POA: Diagnosis not present

## 2020-04-19 DIAGNOSIS — F411 Generalized anxiety disorder: Secondary | ICD-10-CM | POA: Diagnosis not present

## 2020-04-19 MED ORDER — BUPROPION HCL 75 MG PO TABS: 75.0000 mg | ORAL_TABLET | Freq: Every day | ORAL | 0 refills | Status: DC

## 2020-04-19 MED ORDER — RAMELTEON 8 MG PO TABS: 8.0000 mg | ORAL_TABLET | Freq: Every day | ORAL | 1 refills | Status: DC

## 2020-04-19 NOTE — Progress Notes (Signed)
Virtual Visit via Video Note  I connected with Lorraine Hutchinson on 04/19/20 at  1:30 PM EDT by a video enabled telemedicine application and verified that I am speaking with the correct person using two identifiers.  Location Provider Location : [facility] Patient Location : Home  Participants: Patient , Provider   I discussed the limitations of evaluation and management by telemedicine and the availability of in person appointments. The patient expressed understanding and agreed to proceed.    I discussed the assessment and treatment plan with the patient. The patient was provided an opportunity to ask questions and all were answered. The patient agreed with the plan and demonstrated an understanding of the instructions.   The patient was advised to call back or seek an in-person evaluation if the symptoms worsen or if the condition fails to improve as anticipated.  [facility] MD OP Progress Note  04/19/2020 4:15 PM Lorraine Hutchinson  MRN:  [id number]  Chief Complaint:  Chief Complaint    Follow-up; Depression     HPI: Lorraine Hutchinson is a 60 year old Caucasian female, widowed, lives in [place], has a history of MDD, GAD, insomnia, OSA on CPAP, chronic pain, morbid obesity was evaluated by telemedicine today.  Patient today reports that the death anniversary of her husband is coming up on April 9.  She hence has been having crying spells.  She however reports she has been talking to her daughter and that helps.  She is not interested in psychotherapy sessions.  Patient reports she currently struggles with multiple medical problems including neuropathy which can be very painful.  She also reports cardiac problems, shortness of breath.  She has been following up with her cardiologist.  She reports the pain does have an impact on her mood during the day as well as her sleep at night.  She also reports she has not been able to walk too far.  She has been using an exercise bike at home to keep her legs  moving.  That does seem to help to some extent.  She reports she has been using her CPAP.  She however continues to struggle with the sleep hygiene.  She reports she has her days and nights mixed up and tends to sleep during the day and is awake at night.  Last night she went to bed at around 3 or 4 AM and slept till around 7 or 8 AM this morning.  She reports she is not currently using any sleep medication.  She reports she is compliant on the Cymbalta and the BuSpar.  She however has been taking the Wellbutrin from a previous prescription and is currently not taking 150 mg.  She reports she takes Wellbutrin 75 mg p.o. daily.  Patient denies any suicidality, homicidality or perceptual disturbances.  Patient denies any other concerns today.  Visit Diagnosis:    ICD-10-CM   1. MDD (major depressive disorder), recurrent episode, mild (HCC)  F33.0 buPROPion (WELLBUTRIN) 75 MG tablet  2. Generalized anxiety disorder  F41.1   3. Insomnia due to medical condition  G47.01 ramelteon (ROZEREM) 8 MG tablet   OSA, PAIN    Past Psychiatric History: I have reviewed past psychiatric history from my progress note on 04/16/2017.  Past trials of medications like Elavil, Seroquel, Pamelor, Ambien, trazodone, melatonin, Johnnye Sima, Sonata  Past Medical History:  Past Medical History:  Diagnosis Date  . Anxiety   . Cancer (HCC)    cervical  . Depression   . Diabetes mellitus without  complication ([facility])   . Hyperlipidemia   . Hypertension   . Scoliosis   . Sleep apnea     Past Surgical History:  Procedure Laterality Date  . ABDOMINAL HYSTERECTOMY    . APPENDECTOMY    . CESAREAN SECTION    . REPLACEMENT TOTAL KNEE Right   . TONSILLECTOMY      Family Psychiatric History: I have reviewed family psychiatric history from my progress note on 04/16/2017  Family History:  Family History  Problem Relation Age of Onset  . Diabetes Father   . Diabetes Mother   . Cancer Mother   . Anxiety disorder Sister   .  Depression Sister     Social History: Reviewed social history from my progress note on 04/16/2017 Social History   Socioeconomic History  . Marital status: Widowed    Spouse name: arthur  . Number of children: 2  . Years of education: Not on file  . Highest education level: 11th grade  Occupational History  . Not on file  Tobacco Use  . Smoking status: Former Smoker    Packs/day: 1.00    Years: 40.00    Pack years: 40.00    Types: Cigarettes    Quit date: 08/15/2017    Years since quitting: 2.6  . Smokeless tobacco: Never Used  Vaping Use  . Vaping Use: Never used  Substance and Sexual Activity  . Alcohol use: No    Alcohol/week: 0.0 standard drinks  . Drug use: Yes    Types: Marijuana  . Sexual activity: Not Currently  Other Topics Concern  . Not on file  Social History Narrative  . Not on file   Social Determinants of Health   Financial Resource Strain: Not on file  Food Insecurity: Not on file  Transportation Needs: Not on file  Physical Activity: Not on file  Stress: Not on file  Social Connections: Not on file    Allergies:  Allergies  Allergen Reactions  . Codeine Hives    Metabolic Disorder Labs: No results found for: HGBA1C, MPG No results found for: PROLACTIN No results found for: CHOL, TRIG, HDL, CHOLHDL, VLDL, LDLCALC No results found for: TSH  Therapeutic Level Labs: No results found for: LITHIUM No results found for: VALPROATE No components found for:  CBMZ  Current Medications: Current Outpatient Medications  Medication Sig Dispense Refill  . buPROPion (WELLBUTRIN) 75 MG tablet Take 1 tablet (75 mg total) by mouth daily. 90 tablet 0  . ramelteon (ROZEREM) 8 MG tablet Take 1 tablet (8 mg total) by mouth at bedtime. 30 tablet 1  . ACCU-CHEK GUIDE test strip USE (1 STRIP TOTAL) 3 (THREE) TIMES DAILY USE AS INSTRUCTED.    Marland Kitchen albuterol (VENTOLIN HFA) 108 (90 Base) MCG/ACT inhaler Inhale into the lungs.    Marland Kitchen albuterol (VENTOLIN HFA) 108 (90  Base) MCG/ACT inhaler Inhale into the lungs.    . B Complex Vitamins (VITAMIN B COMPLEX) TABS Take by mouth.    . B Complex-Biotin-FA (TH VITAMIN B 50/B-COMPLEX) TABS Take 1 tablet by mouth daily.    . B-D UF III MINI PEN NEEDLES 31G X 5 MM MISC U UTD 2 XD  3  . busPIRone (BUSPAR) 10 MG tablet Take 1 tablet (10 mg total) by mouth 2 (two) times daily. 180 tablet 1  . Calcium Carbonate-Vitamin D 600-200 MG-UNIT TABS Take by mouth.    . Calcium-Vitamin D 600-200 MG-UNIT tablet Take 1 tablet by mouth 2 (two) times daily.    Marland Kitchen  Cinnamon 500 MG capsule Take 2,000 mg by mouth daily.     . ciprofloxacin (CIPRO) 500 MG tablet Take 500 mg by mouth 2 (two) times daily.    . clonazePAM (KLONOPIN) 0.5 MG tablet Take 1 tablet (0.5 mg total) by mouth as directed. Start taking 2-3 times a week for severe anxiety attacks only 21 tablet 0  . diltiazem (DILACOR XR) 180 MG 24 hr capsule Take 180 mg by mouth 2 (two) times daily.    . DULoxetine (CYMBALTA) 60 MG capsule Take 1 capsule (60 mg total) by mouth daily. 90 capsule 1  . furosemide (LASIX) 20 MG tablet Take by mouth.    . insulin aspart protamine - aspart (NOVOLOG 70/30 MIX) (70-30) 100 UNIT/ML FlexPen Inject 60 Units into the skin daily before breakfast. 62 units before supper    . Multiple Vitamins-Minerals (ABC PLUS SENIOR ADULTS 50+ PO) Take by mouth.    Marland Kitchen omeprazole (PRILOSEC) 20 MG capsule Take 20 mg by mouth daily.    . phentermine (ADIPEX-P) 37.5 MG tablet Take 37.5 mg by mouth every morning.    . Potassium 99 MG TABS Take 1 tablet by mouth daily.    . pravastatin (PRAVACHOL) 40 MG tablet Take 40 mg by mouth daily. (Patient not taking: Reported on 03/17/2020)    . predniSONE (DELTASONE) 20 MG tablet Take 20 mg by mouth 2 (two) times daily. (Patient not taking: No sig reported)    . pregabalin (LYRICA) 25 MG capsule Take by mouth. (Patient not taking: Reported on 03/17/2020)    . torsemide (DEMADEX) 10 MG tablet Take by mouth.    . TRELEGY ELLIPTA  100-62.5-25 MCG/INH AEPB Inhale 1 puff into the lungs daily.     No current facility-administered medications for this visit.     Musculoskeletal: Strength & Muscle Tone: UTA Gait & Station: UTA Patient leans: N/A  Psychiatric Specialty Exam: Review of Systems  Respiratory: Positive for shortness of breath.   Musculoskeletal:       Neuropathic pain  Psychiatric/Behavioral: Positive for dysphoric mood and sleep disturbance. The patient is nervous/anxious.   All other systems reviewed and are negative.   There were no vitals taken for this visit.There is no height or weight on file to calculate BMI.  General Appearance: Casual  Eye Contact:  Fair  Speech:  Clear and Coherent  Volume:  Normal  Mood:  Anxious and Depressed  Affect:  Appropriate  Thought Process:  Goal Directed and Descriptions of Associations: Intact  Orientation:  Full (Time, Place, and Person)  Thought Content: Logical   Suicidal Thoughts:  No  Homicidal Thoughts:  No  Memory:  Immediate;   Fair Recent;   Fair Remote;   Fair  Judgement:  Fair  Insight:  Fair  Psychomotor Activity:  Normal  Concentration:  Concentration: Fair and Attention Span: Fair  Recall:  [employer] of Knowledge: Fair  Language: Fair  Akathisia:  No  Handed:  Right  AIMS (if indicated): UTA  Assets:  Communication Skills Desire for Improvement Housing Social Support  ADL's:  Intact  Cognition: WNL  Sleep:  Poor   Screenings: PHQ2-9   Flowsheet Row Video Visit from 04/19/2020 in [facility] Video Visit from 03/17/2020 in [facility] Office Visit from 12/03/2016 in [facility] Procedure visit from 11/15/2016 in [facility] Office Visit from 10/24/2016 in [facility]  PHQ-2 Total  Score 5 3 0 0 0  PHQ-9 Total Score 11 10 -- -- --    Flowsheet Row  Video Visit from 04/19/2020 in [facility] Video Visit from 03/17/2020 in [facility] No Risk No Risk       Assessment and Plan: Lorraine Hutchinson is a 60 year old Caucasian female, widowed, lives in [place], has a history of MDD, GAD, insomnia was evaluated by telemedicine today.  Patient is currently struggling with multiple medical problems including neuropathy pain, cardiac problems with history of prolonged duration of QT.  Patient also with sleep problems as well as grief reaction.  She will benefit from following plan.  Plan MDD-unstable PHQ 9 today equals 11 Reduce Wellbutrin to 75 mg p.o. daily.  She has not been taking the 150 mg as prescribed.  She has been noncompliant. Cymbalta 60 mg p.o. daily BuSpar 10 mg p.o. twice daily  GAD-unstable BuSpar as prescribed Cymbalta as prescribed Klonopin 0.5 mg as needed 2-3 times a week for severe anxiety attacks only.  Insomnia-unstable Continue CPAP Start Rozerem 8 mg p.o. nightly. Patient advised to work on sleep hygiene techniques.  History of prolonged QT syndrome.  Patient will continue to follow-up with cardiology.  Patient will continue to follow-up with primary provider for neuropathy pain.  Discussed with patient regarding referral for grief counseling.  She is not interested.  She does have support from her daughter.  Follow-up in clinic in 4 weeks or sooner if needed.  This note was generated in part or whole with voice recognition software. Voice recognition is usually quite accurate but there are transcription errors that can and very often do occur. I apologize for any typographical errors that were not detected and corrected.      Ursula Alert, MD  04/19/2020, 4:15 PM

## 2020-04-19 NOTE — Patient Instructions (Signed)
Ramelteon Oral Tablets What is this medicine? RAMELTEON (ram EL tee on) is used to treat insomnia (trouble sleeping). This medicine helps you to fall asleep. This medicine may be used for other purposes; ask your health care provider or pharmacist if you have questions. COMMON BRAND NAME(S): Rozerem What should I tell my health care provider before I take this medicine? They need to know if you have any of these conditions:  history of alcohol or medicine abuse or addiction  liver disease  lung or breathing disease (asthma, COPD)  mental health disease  sleep apnea  suicidal thoughts, plans or attempt  an unusual or allergic reaction to ramelteon, other medicines, foods, dyes, or preservatives  pregnant or trying to get pregnant  breast-feeding How should I use this medicine? Take this medicine by mouth with water. Take it as directed on the prescription label and only when you are ready for bed. Do not cut or break this medicine. Swallow the tablets whole. Do not take it with or right after a meal. A special MedGuide will be given to you by the pharmacist with each prescription and refill. Be sure to read this information carefully each time. Talk to your health care provider about the use of this medicine in children. It is not approved for use in children. Overdosage: If you think you have taken too much of this medicine contact a poison control center or emergency room at once. NOTE: This medicine is only for you. Do not share this medicine with others. What if I miss a dose? This does not apply. This medicine should only be taken immediately before going to sleep. Do not take double or extra doses. What may interact with this medicine? Do not take this medicine with any of the following medications:  fluvoxamine  melatonin  tasimelteon  viloxazine This medicine may also interact with the following medications:  alcohol  certain medicines for fungal infections like  ketoconazole, fluconazole, or itraconazole  ciprofloxacin  donepezil  doxepin  other medicines for sleep  rifampin This list may not describe all possible interactions. Give your health care provider a list of all the medicines, herbs, non-prescription drugs, or dietary supplements you use. Also tell them if you smoke, drink alcohol, or use illegal drugs. Some items may interact with your medicine. What should I watch for while using this medicine? Visit your health care provider for regular checks on your progress. Tell your health care provider if your symptoms do not start to get better or if they get worse. Do not take this medicine unless you are able to stay in bed for a full night (7 to 8 hours) before you must be active again. Do not stand or sit up quickly after taking this medicine, especially if you are an older patient. This reduces the risk of dizzy or fainting spells. You may still feel drowsy the next day after taking this medicine. Do not drive or do other dangerous activities until you feel fully awake. After taking this medicine, you may get up out of bed and do an activity that you do not know you are doing. The next morning, you may have no memory of this. Activities include driving a car ("sleep-driving"), making and eating food, talking on the phone, sexual activity, and sleep-walking. Serious injuries have occurred. Stop the medicine and call your doctor right away if you find out you have done any of these activities. Do not take this medicine if you have used alcohol that  evening. Do not take it if you have taken another medicine for sleep. The risk of doing these sleep-related activities is higher. If you or your family notice any changes in your behavior, such as new or worsening depression, thoughts of harming yourself, anxiety, other unusual or disturbing thoughts, or memory loss, call your health care provider right away. After you stop taking this medicine, you may  have trouble falling asleep. This is called rebound insomnia. This problem usually goes away on its own after 1 or 2 nights. What side effects may I notice from receiving this medicine? Side effects that you should report to your doctor or health care professional as soon as possible:  allergic reactions (skin rash, itching or hives; swelling of the face, lips, or tongue)  abnormal production of milk  changes in sex drive or performance  hallucinations  missed monthly period (for women)  trouble breathing  unusual activities while not fully awake such as driving, eating, making phone calls, or sexual activity  vomiting Side effects that usually do not require medical attention (report to your doctor or health care professional if they continue or are bothersome):  dizziness  drowsiness  headache  unusual dreams or nightmares  tiredness This list may not describe all possible side effects. Call your doctor for medical advice about side effects. You may report side effects to FDA at 1-800-FDA-1088. Where should I keep my medicine? Keep out of the reach of children and pets. Store at room temperature between 15 and 30 degrees C (59 and 86 degrees F). Protect from light and moisture. Keep the container tightly closed. Get rid of any unused medicine after the expiration date. To get rid of medicines that are no longer needed or have expired:  Take the medicine to a medicine take-back program. Check with your pharmacy or law enforcement to find a location.  If you cannot return the medicine, check the label or package insert to see if the medicine should be thrown out in the garbage or flushed down the toilet. If you are not sure, ask your health care provider. If it is safe to put it in the trash, take the medicine out of the container. Mix the medicine with cat litter, dirt, coffee grounds, or other unwanted substance. Seal the mixture in a bag or container. Put it in the  trash. NOTE: This sheet is a summary. It may not cover all possible information. If you have questions about this medicine, talk to your doctor, pharmacist, or health care provider.  2021 Elsevier/Gold Standard (2019-04-30 15:31:46)

## 2020-04-20 ENCOUNTER — Other Ambulatory Visit: Payer: Self-pay | Admitting: Psychiatry

## 2020-04-20 DIAGNOSIS — G4701 Insomnia due to medical condition: Secondary | ICD-10-CM

## 2020-04-21 ENCOUNTER — Telehealth: Payer: Self-pay

## 2020-04-21 NOTE — Telephone Encounter (Signed)
Returned call to patient.  Discussed that Ambien was sent to the pharmacy since pharmacy requested alternative sleep medication.  She will contact pharmacy and let writer know.

## 2020-04-21 NOTE — Telephone Encounter (Signed)
pt called states she can not afford the sleeping medication it would cost her $399.

## 2020-05-18 ENCOUNTER — Encounter: Payer: Self-pay | Admitting: Psychiatry

## 2020-05-18 ENCOUNTER — Telehealth (INDEPENDENT_AMBULATORY_CARE_PROVIDER_SITE_OTHER): Payer: 59 | Admitting: Psychiatry

## 2020-05-18 ENCOUNTER — Other Ambulatory Visit: Payer: Self-pay

## 2020-05-18 DIAGNOSIS — G4701 Insomnia due to medical condition: Secondary | ICD-10-CM

## 2020-05-18 DIAGNOSIS — F411 Generalized anxiety disorder: Secondary | ICD-10-CM | POA: Diagnosis not present

## 2020-05-18 DIAGNOSIS — F330 Major depressive disorder, recurrent, mild: Secondary | ICD-10-CM | POA: Diagnosis not present

## 2020-05-18 NOTE — Progress Notes (Signed)
Virtual Visit via Video Note  I connected with [name] on 05/18/20 at  3:00 PM EDT by a video enabled telemedicine application and verified that I am speaking with the correct person using two identifiers.  Location Provider Location : [facility] Patient Location : Home  Participants: Patient , Provider   I discussed the limitations of evaluation and management by telemedicine and the availability of in person appointments. The patient expressed understanding and agreed to proceed.    I discussed the assessment and treatment plan with the patient. The patient was provided an opportunity to ask questions and all were answered. The patient agreed with the plan and demonstrated an understanding of the instructions.   The patient was advised to call back or seek an in-person evaluation if the symptoms worsen or if the condition fails to improve as anticipated.   [place] MD OP Progress Note  05/18/2020 3:23 PM LEXY MEININGER  MRN:  [id number]  Chief Complaint:  Chief Complaint    Follow-up; Depression     HPI: Lorraine Hutchinson is a 60 year old Caucasian female, widowed, lives in [place] has a history of MDD, GAD, insomnia, OSA on CPAP, chronic pain, morbid obesity was evaluated by telemedicine today.  Patient reports she was able to get through the death anniversary of her husband on April 9.  She had support from her daughter.  Patient continues to struggle with pain.  She reports she struggles with neuropathy pain of her bilateral feet.  She is on pregabalin however it does not help much.  She does report sleep problems and the pain does have an impact on her sleep also.  She reports the Ambien has helped and she sleeps 4 hours with it.  However the pain does make her sleep restless.  She continues to be compliant on CPAP.  Patient denies any suicidality, homicidality or perceptual disturbances.  Denies any significant depression or anxiety symptoms.  She is compliant on Cymbalta and  BuSpar.  Denies side effects.  Patient denies any other concerns today.  Visit Diagnosis:    ICD-10-CM   1. MDD (major depressive disorder), recurrent episode, mild ([place])  F33.0   2. Generalized anxiety disorder  F41.1   3. Insomnia due to medical condition  G47.01    OSA, pain    Past Psychiatric History: I have reviewed past psychiatric history from progress note on 04/16/2017.  Past trials of medications like Elavil, Seroquel, Pamelor, Ambien, trazodone, melatonin, Lunesta, Sonata, Rozerem  Past Medical History:  Past Medical History:  Diagnosis Date  . Anxiety   . Cancer (HCC)    cervical  . Depression   . Diabetes mellitus without complication ([place])   . Hyperlipidemia   . Hypertension   . Scoliosis   . Sleep apnea     Past Surgical History:  Procedure Laterality Date  . ABDOMINAL HYSTERECTOMY    . APPENDECTOMY    . CESAREAN SECTION    . REPLACEMENT TOTAL KNEE Right   . TONSILLECTOMY      Family Psychiatric History: I have reviewed family psychiatric history from progress note on 04/16/2017.  Family History:  Family History  Problem Relation Age of Onset  . Diabetes Father   . Diabetes Mother   . Cancer Mother   . Anxiety disorder Sister   . Depression Sister     Social History: Reviewed social history from progress note on 04/16/2017. Social History   Socioeconomic History  . Marital status: Widowed    Spouse  name: arthur  . Number of children: 2  . Years of education: Not on file  . Highest education level: 11th grade  Occupational History  . Not on file  Tobacco Use  . Smoking status: Former Smoker    Packs/day: 1.00    Years: 40.00    Pack years: 40.00    Types: Cigarettes    Quit date: 08/15/2017    Years since quitting: 2.7  . Smokeless tobacco: Never Used  Vaping Use  . Vaping Use: Never used  Substance and Sexual Activity  . Alcohol use: No    Alcohol/week: 0.0 standard drinks  . Drug use: Yes    Types: Marijuana  . Sexual activity: Not  Currently  Other Topics Concern  . Not on file  Social History Narrative  . Not on file   Social Determinants of Health   Financial Resource Strain: Not on file  Food Insecurity: Not on file  Transportation Needs: Not on file  Physical Activity: Not on file  Stress: Not on file  Social Connections: Not on file    Allergies:  Allergies  Allergen Reactions  . Codeine Hives    Metabolic Disorder Labs: No results found for: HGBA1C, MPG No results found for: PROLACTIN No results found for: CHOL, TRIG, HDL, CHOLHDL, VLDL, LDLCALC No results found for: TSH  Therapeutic Level Labs: No results found for: LITHIUM No results found for: VALPROATE No components found for:  CBMZ  Current Medications: Current Outpatient Medications  Medication Sig Dispense Refill  . pregabalin (LYRICA) 25 MG capsule Take by mouth.    Marland Kitchen ACCU-CHEK GUIDE test strip USE (1 STRIP TOTAL) 3 (THREE) TIMES DAILY USE AS INSTRUCTED.    Marland Kitchen albuterol (VENTOLIN HFA) 108 (90 Base) MCG/ACT inhaler Inhale into the lungs.    Marland Kitchen albuterol (VENTOLIN HFA) 108 (90 Base) MCG/ACT inhaler Inhale into the lungs.    . B Complex Vitamins (VITAMIN B COMPLEX) TABS Take by mouth.    . B Complex-Biotin-FA (TH VITAMIN B 50/B-COMPLEX) TABS Take 1 tablet by mouth daily.    . B-D UF III MINI PEN NEEDLES 31G X 5 MM MISC U UTD 2 XD  3  . buPROPion (WELLBUTRIN) 75 MG tablet Take 1 tablet (75 mg total) by mouth daily. 90 tablet 0  . busPIRone (BUSPAR) 10 MG tablet Take 1 tablet (10 mg total) by mouth 2 (two) times daily. 180 tablet 1  . Calcium Carbonate-Vitamin D 600-200 MG-UNIT TABS Take by mouth.    . Calcium-Vitamin D 600-200 MG-UNIT tablet Take 1 tablet by mouth 2 (two) times daily.    . Cinnamon 500 MG capsule Take 2,000 mg by mouth daily.     . ciprofloxacin (CIPRO) 500 MG tablet Take 500 mg by mouth 2 (two) times daily.    . clonazePAM (KLONOPIN) 0.5 MG tablet Take 1 tablet (0.5 mg total) by mouth as directed. Start taking 2-3 times  a week for severe anxiety attacks only 21 tablet 0  . diltiazem (DILACOR XR) 180 MG 24 hr capsule Take 180 mg by mouth 2 (two) times daily.    . DULoxetine (CYMBALTA) 60 MG capsule Take 1 capsule (60 mg total) by mouth daily. 90 capsule 1  . furosemide (LASIX) 20 MG tablet Take by mouth.    . insulin aspart protamine - aspart (NOVOLOG 70/30 MIX) (70-30) 100 UNIT/ML FlexPen Inject 60 Units into the skin daily before breakfast. 62 units before supper    . Multiple Vitamins-Minerals (ABC PLUS SENIOR ADULTS 50+  PO) Take by mouth.    Marland Kitchen omeprazole (PRILOSEC) 20 MG capsule Take 20 mg by mouth daily.    . phentermine (ADIPEX-P) 37.5 MG tablet Take 37.5 mg by mouth every morning.    . Potassium 99 MG TABS Take 1 tablet by mouth daily.    . pravastatin (PRAVACHOL) 40 MG tablet Take 40 mg by mouth daily. (Patient not taking: Reported on 03/17/2020)    . predniSONE (DELTASONE) 20 MG tablet Take 20 mg by mouth 2 (two) times daily. (Patient not taking: No sig reported)    . torsemide (DEMADEX) 10 MG tablet Take by mouth.    . TRELEGY ELLIPTA 100-62.5-25 MCG/INH AEPB Inhale 1 puff into the lungs daily.    Marland Kitchen zolpidem (AMBIEN) 10 MG tablet Take 0.5 tablets (5 mg total) by mouth at bedtime as needed for sleep. 15 tablet 1   No current facility-administered medications for this visit.     Musculoskeletal: Strength & Muscle Tone: UTA Gait & Station: UTA Patient leans: N/A  Psychiatric Specialty Exam: Review of Systems  Musculoskeletal:       BL feet and LE neuropathy pain  Psychiatric/Behavioral: Positive for sleep disturbance.  All other systems reviewed and are negative.   There were no vitals taken for this visit.There is no height or weight on file to calculate BMI.  General Appearance: UTA  Eye Contact:  UTA  Speech:  Clear and Coherent  Volume:  Normal  Mood:  Depressed improving  Affect:  Congruent  Thought Process:  Goal Directed and Descriptions of Associations: Intact  Orientation:  Full  (Time, Place, and Person)  Thought Content: Logical   Suicidal Thoughts:  No  Homicidal Thoughts:  No  Memory:  Immediate;   Fair Recent;   Fair Remote;   Fair  Judgement:  Fair  Insight:  Fair  Psychomotor Activity:  Normal  Concentration:  Concentration: Fair and Attention Span: Fair  Recall:  [employer] of Knowledge: Fair  Language: Fair  Akathisia:  No  Handed:  Right  AIMS (if indicated): UTA  Assets:  Communication Skills Desire for Improvement Housing Social Support  ADL's:  Intact  Cognition: WNL  Sleep:  Improving   Screenings: PHQ2-9   Flowsheet Row Video Visit from 04/19/2020 in [facility] Video Visit from 03/17/2020 in [facility] Office Visit from 12/03/2016 in [facility] Procedure visit from 11/15/2016 in [facility] Office Visit from 10/24/2016 in [facility]  PHQ-2 Total Score 5 3 0 0 0  PHQ-9 Total Score 11 10 -- -- --    Flowsheet Row Video Visit from 04/19/2020 in [facility] Video Visit from 03/17/2020 in [facility] No Risk No Risk       Assessment and Plan: AKEEMA BRODER is a 60 year old Caucasian female, widowed, lives in [place], has a history of MDD, GAD, insomnia was evaluated by telemedicine today.  Patient is currently struggling with sleep problems likely due to neuropathy pain.  She will benefit from the following plan.  Plan MDD-improving Wellbutrin 75 mg p.o. daily. Cymbalta 60 mg p.o. daily BuSpar 10 mg p.o. twice daily  GAD-improving BuSpar and Cymbalta as prescribed Klonopin 0.5 mg as needed 2-3 times a week only for severe anxiety attacks  Insomnia-improving Continue CPAP Continue Ambien 5 mg p.o. nightly Patient advised to contact her provider for management of  neuropathy pain which does have an impact on her sleep Patient advised to continue to work on sleep hygiene techniques.  Follow-up in clinic in 6  weeks or sooner if needed.   This note was generated in part or whole with voice recognition software. Voice recognition is usually quite accurate but there are transcription errors that can and very often do occur. I apologize for any typographical errors that were not detected and corrected.     Ursula Alert, MD 05/19/2020, 2:59 PM

## 2020-05-27 ENCOUNTER — Other Ambulatory Visit: Payer: Self-pay | Admitting: Psychiatry

## 2020-05-27 DIAGNOSIS — F411 Generalized anxiety disorder: Secondary | ICD-10-CM

## 2020-06-21 ENCOUNTER — Other Ambulatory Visit: Payer: Self-pay | Admitting: Psychiatry

## 2020-06-21 DIAGNOSIS — F411 Generalized anxiety disorder: Secondary | ICD-10-CM

## 2020-06-23 ENCOUNTER — Emergency Department: Payer: 59

## 2020-06-23 ENCOUNTER — Other Ambulatory Visit: Payer: Self-pay

## 2020-06-23 ENCOUNTER — Emergency Department
Admission: EM | Admit: 2020-06-23 | Discharge: 2020-06-23 | Disposition: A | Discharge status: Home / Self Care | Payer: 59 | Attending: Emergency Medicine | Admitting: Emergency Medicine

## 2020-06-23 DIAGNOSIS — M5441 Lumbago with sciatica, right side: Secondary | ICD-10-CM | POA: Insufficient documentation

## 2020-06-23 DIAGNOSIS — J449 Chronic obstructive pulmonary disease, unspecified: Secondary | ICD-10-CM | POA: Diagnosis not present

## 2020-06-23 DIAGNOSIS — Y9301 Activity, walking, marching and hiking: Secondary | ICD-10-CM | POA: Insufficient documentation

## 2020-06-23 DIAGNOSIS — Y92511 Restaurant or cafe as the place of occurrence of the external cause: Secondary | ICD-10-CM | POA: Diagnosis not present

## 2020-06-23 DIAGNOSIS — E1122 Type 2 diabetes mellitus with diabetic chronic kidney disease: Secondary | ICD-10-CM | POA: Diagnosis not present

## 2020-06-23 DIAGNOSIS — Z79899 Other long term (current) drug therapy: Secondary | ICD-10-CM | POA: Diagnosis not present

## 2020-06-23 DIAGNOSIS — Z96651 Presence of right artificial knee joint: Secondary | ICD-10-CM | POA: Insufficient documentation

## 2020-06-23 DIAGNOSIS — S59901A Unspecified injury of right elbow, initial encounter: Secondary | ICD-10-CM | POA: Diagnosis present

## 2020-06-23 DIAGNOSIS — E1140 Type 2 diabetes mellitus with diabetic neuropathy, unspecified: Secondary | ICD-10-CM | POA: Diagnosis not present

## 2020-06-23 DIAGNOSIS — M25521 Pain in right elbow: Secondary | ICD-10-CM

## 2020-06-23 DIAGNOSIS — Z7951 Long term (current) use of inhaled steroids: Secondary | ICD-10-CM | POA: Diagnosis not present

## 2020-06-23 DIAGNOSIS — W19XXXA Unspecified fall, initial encounter: Secondary | ICD-10-CM

## 2020-06-23 DIAGNOSIS — N1832 Chronic kidney disease, stage 3b: Secondary | ICD-10-CM | POA: Insufficient documentation

## 2020-06-23 DIAGNOSIS — S50311A Abrasion of right elbow, initial encounter: Secondary | ICD-10-CM | POA: Insufficient documentation

## 2020-06-23 DIAGNOSIS — S80211A Abrasion, right knee, initial encounter: Secondary | ICD-10-CM | POA: Diagnosis not present

## 2020-06-23 DIAGNOSIS — M25561 Pain in right knee: Secondary | ICD-10-CM

## 2020-06-23 DIAGNOSIS — M5442 Lumbago with sciatica, left side: Secondary | ICD-10-CM | POA: Insufficient documentation

## 2020-06-23 DIAGNOSIS — I129 Hypertensive chronic kidney disease with stage 1 through stage 4 chronic kidney disease, or unspecified chronic kidney disease: Secondary | ICD-10-CM | POA: Diagnosis not present

## 2020-06-23 DIAGNOSIS — W010XXA Fall on same level from slipping, tripping and stumbling without subsequent striking against object, initial encounter: Secondary | ICD-10-CM | POA: Diagnosis not present

## 2020-06-23 DIAGNOSIS — Z87891 Personal history of nicotine dependence: Secondary | ICD-10-CM | POA: Diagnosis not present

## 2020-06-23 DIAGNOSIS — M25572 Pain in left ankle and joints of left foot: Secondary | ICD-10-CM

## 2020-06-23 DIAGNOSIS — Z794 Long term (current) use of insulin: Secondary | ICD-10-CM | POA: Insufficient documentation

## 2020-06-23 DIAGNOSIS — M25511 Pain in right shoulder: Secondary | ICD-10-CM | POA: Diagnosis not present

## 2020-06-23 DIAGNOSIS — Z8541 Personal history of malignant neoplasm of cervix uteri: Secondary | ICD-10-CM | POA: Insufficient documentation

## 2020-06-23 MED ORDER — LIDOCAINE 5 % EX PTCH
1.0000 | MEDICATED_PATCH | CUTANEOUS | Status: DC
  Administered 2020-06-23: 1 via TRANSDERMAL
  Filled 2020-06-23: qty 1

## 2020-06-23 MED ORDER — ACETAMINOPHEN 500 MG PO TABS
1000.0000 mg | ORAL_TABLET | Freq: Once | ORAL | Status: AC
  Administered 2020-06-23: 1000 mg via ORAL
  Filled 2020-06-23: qty 2

## 2020-06-23 MED ORDER — BACITRACIN ZINC 500 UNIT/GM EX OINT
TOPICAL_OINTMENT | Freq: Two times a day (BID) | CUTANEOUS | Status: DC
  Filled 2020-06-23: qty 0.9

## 2020-06-23 MED ORDER — SORBITOL 70 % SOLN
960.0000 mL | TOPICAL_OIL | Freq: Once | ORAL | Status: DC
  Filled 2020-06-23: qty 473

## 2020-06-23 NOTE — ED Provider Notes (Signed)
[facility] Emergency Department Provider Note  ____________________________________________   Event Date/Time   First MD Initiated Contact with Patient 06/23/20 1703     (approximate)  I have reviewed the triage vital signs and the nursing notes.   HISTORY  Chief Complaint Fall   HPI Lorraine Hutchinson is a 60 y.o. female who presents to the ER via EMS for evaluation after a fall. Patient states she was coming out of [place] when she stepped awkwardly off of the curb and fell. She denies hitting her head or losing consciousness, denies blood thinner use. She is complaining of significant pain to the low back, right elbow, right knee and left ankle pain. Denies loss of bowel/bladder control, saddle anesthesia or extremity weakness. She denies any chest pain, shortness of breath, abdominal pain or any preceding symptoms prior to her mechanical fall.         Past Medical History:  Diagnosis Date   Anxiety    Cancer ([place])    cervical   Depression    Diabetes mellitus without complication ([place])    Hyperlipidemia    Hypertension    Scoliosis    Sleep apnea     Patient Active Problem List   Diagnosis Date Noted   Insomnia due to medical condition 05/18/2020   Exertional dyspnea 04/07/2020   Prolonged Q-T interval on ECG 02/25/2020   Acute kidney failure ([name]) 02/18/2020   MDD (major depressive disorder), recurrent episode, mild ([place]) 10/01/2019   No-show for appointment 05/15/2019   Urinary tract infection 05/04/2019   Stage 3b chronic kidney disease ([place]) 05/04/2019   Proteinuria 05/04/2019   Hypokalemia 05/04/2019   Benign hypertensive kidney disease with chronic kidney disease 05/04/2019   MDD (major depressive disorder), recurrent, in full remission ([place]) 04/02/2019   Bereavement 04/02/2019   Noncompliance with medication regimen 04/02/2019   MDD (major depressive disorder), recurrent, in partial remission ([place]) 12/31/2018   MDD (major  depressive disorder), recurrent episode, moderate ([place]) 08/13/2018   Insomnia due to mental condition 08/13/2018   Gastroesophageal reflux disease without esophagitis 07/22/2018   Neurogenic pain 12/30/2016   Chronic musculoskeletal pain 12/30/2016   Groin pain (Right) 11/15/2016   Abnormal MRI, lumbar spine (10/21/2016) 10/24/2016   Lumbar foraminal stenosis (L2-3 and L3-4) (Right) 10/24/2016   Pain medication agreement signed 10/05/2016   Generalized anxiety disorder 10/03/2016   Hyperlipidemia, mixed 10/03/2016   Type 2 diabetes mellitus with peripheral neuropathy ([place]) 10/03/2016   Hyperlipidemia due to type 2 diabetes mellitus ([place]) 10/03/2016   Chronic low back pain (Primary Area of Pain) (Bilateral) (R>L) 09/27/2016   Chronic lumbar radicular pain (Right) (L4 Dermatome) 09/26/2016   Shoulder impingement syndrome (Right) 09/25/2016   Lumbar facet osteoarthritis (Bilateral) 09/25/2016   Lumbar facet syndrome (Bilateral) (R>L) 09/25/2016   Lumbar facet arthralgia (Bilateral) (R>L) 09/25/2016   Lumbar facet arthropathy (Bilateral) 09/25/2016   DDD (degenerative disc disease), lumbar (L2-3) 09/25/2016   Osteoarthritis of lumbar spine 09/25/2016   Chronic pain of lower extremity (Secondary Area of Pain) (R>L) 09/25/2016   Chronic shoulder pain (Fourth Area of Pain) (R>L) 09/25/2016   Elevated C-reactive protein (CRP) 09/25/2016   Hypomagnesemia 09/25/2016   Chronic pain syndrome 09/25/2016   Long term current use of opiate analgesic 09/18/2016   Long term prescription opiate use 09/18/2016   Opiate use 09/18/2016   Chronic sacroiliac joint pain (Bilateral) (R>L) 09/18/2016   Chronic neck pain (Tertiary Area of Pain)( (R>L) 09/18/2016   Encounter for screening for nutritional  disorder 09/18/2016   Chronic knee pain (Right) 09/18/2016   OSA (obstructive sleep apnea) 05/17/2015   Vitamin D deficiency, unspecified 05/17/2015   Chronic depression 05/03/2015   COPD, moderate ([place])  05/03/2015   Essential hypertension 05/03/2015   Former cigarette smoker 05/03/2015   History of type 2 diabetes mellitus 05/03/2015   Obesity, morbid, BMI 50 or higher ([place]) 05/03/2015    Past Surgical History:  Procedure Laterality Date   ABDOMINAL HYSTERECTOMY     APPENDECTOMY     CESAREAN SECTION     REPLACEMENT TOTAL KNEE Right    TONSILLECTOMY      Prior to Admission medications   Medication Sig Start Date End Date Taking? Authorizing Provider  ACCU-CHEK GUIDE test strip USE (1 STRIP TOTAL) 3 (THREE) TIMES DAILY USE AS INSTRUCTED. 02/24/20   [provider]  albuterol (VENTOLIN HFA) 108 (90 Base) MCG/ACT inhaler Inhale into the lungs. 08/12/19   [provider]  albuterol (VENTOLIN HFA) 108 (90 Base) MCG/ACT inhaler Inhale into the lungs. 08/12/19   [provider]  B Complex Vitamins (VITAMIN B COMPLEX) TABS Take by mouth.    [provider]  B Complex-Biotin-FA (TH VITAMIN B 50/B-COMPLEX) TABS Take 1 tablet by mouth daily.    [provider]  B-D UF III MINI PEN NEEDLES 31G X 5 MM MISC U UTD 2 XD 09/04/16   [provider]  buPROPion (WELLBUTRIN) 75 MG tablet Take 1 tablet (75 mg total) by mouth daily. 04/19/20   Ursula Alert, MD  busPIRone (BUSPAR) 10 MG tablet Take 1 tablet (10 mg total) by mouth 2 (two) times daily. 03/18/20   Ursula Alert, MD  Calcium Carbonate-Vitamin D 600-200 MG-UNIT TABS Take by mouth.    [provider]  Calcium-Vitamin D 600-200 MG-UNIT tablet Take 1 tablet by mouth 2 (two) times daily.    [provider]  Cinnamon 500 MG capsule Take 2,000 mg by mouth daily.     [provider]  ciprofloxacin (CIPRO) 500 MG tablet Take 500 mg by mouth 2 (two) times daily. 05/04/19   [provider]  clonazePAM (KLONOPIN) 0.5 MG tablet Take 1 tablet (0.5 mg total) by mouth as directed. Start taking 2-3 times a week for severe anxiety attacks only 03/17/20   Ursula Alert, MD   diltiazem (DILACOR XR) 180 MG 24 hr capsule Take 180 mg by mouth 2 (two) times daily.    [provider]  DULoxetine (CYMBALTA) 60 MG capsule Take 1 capsule (60 mg total) by mouth daily. 03/18/20   Ursula Alert, MD  furosemide (LASIX) 20 MG tablet Take by mouth. 09/20/17 10/01/19  [provider]  insulin aspart protamine - aspart (NOVOLOG 70/30 MIX) (70-30) 100 UNIT/ML FlexPen Inject 60 Units into the skin daily before breakfast. 62 units before supper 06/21/15   [provider]  Multiple Vitamins-Minerals (ABC PLUS SENIOR ADULTS 50+ PO) Take by mouth.    [provider]  omeprazole (PRILOSEC) 20 MG capsule Take 20 mg by mouth daily. 04/13/15   [provider]  phentermine (ADIPEX-P) 37.5 MG tablet Take 37.5 mg by mouth every morning. 04/09/20   [provider]  Potassium 99 MG TABS Take 1 tablet by mouth daily.    [provider]  pravastatin (PRAVACHOL) 40 MG tablet Take 40 mg by mouth daily. Patient not taking: Reported on 03/17/2020    [provider]  pregabalin (LYRICA) 25 MG capsule Take by mouth. 02/23/20 02/22/21  [provider]  torsemide (DEMADEX) 10 MG tablet Take by mouth. 03/16/20 03/16/21  [provider]  TRELEGY ELLIPTA 100-62.5-25 MCG/INH AEPB Inhale 1 puff into the lungs daily. 09/18/19   [provider]  zolpidem (AMBIEN) 10 MG tablet Take 0.5 tablets (5 mg total) by mouth at bedtime as needed for sleep. 04/20/20   Ursula Alert, MD    Allergies Codeine  Family History  Problem Relation Age of Onset   Diabetes Father    Diabetes Mother    Cancer Mother    Anxiety disorder Sister    Depression Sister     Social History Social History   Tobacco Use   Smoking status: Former    Packs/day: 1.00    Years: 40.00    Pack years: 40.00    Types: Cigarettes    Quit date: 08/15/2017    Years since quitting: 2.8   Smokeless tobacco: Never  Vaping Use   Vaping Use: Never used  Substance  Use Topics   Alcohol use: No    Alcohol/week: 0.0 standard drinks   Drug use: Yes    Types: Marijuana    Review of Systems Constitutional: + fall, No fever/chills Eyes: No visual changes. ENT: No sore throat. Cardiovascular: Denies chest pain. Respiratory: Denies shortness of breath. Gastrointestinal: No abdominal pain.  No nausea, no vomiting.  No diarrhea.  No constipation. Genitourinary: Negative for dysuria. Musculoskeletal: + back pain, +right elbow, + right shoulder, + right knee, + left ankle Skin: Negative for rash. Neurological: Negative for headaches, focal weakness or numbness.  ____________________________________________   PHYSICAL EXAM:  VITAL SIGNS: ED Triage Vitals  Enc Vitals Group     BP 06/23/20 1652 (!) 143/81     Pulse Rate 06/23/20 1651 (!) 101     Resp 06/23/20 1651 18     Temp 06/23/20 1651 98.9 F (37.2 C)     Temp Source 06/23/20 1651 Oral     SpO2 06/23/20 1651 98 %     Weight 06/23/20 1650 (!) 411 lb (186.4 kg)     Height 06/23/20 1650 5\' 9"  (1.753 m)     Head Circumference --      Peak Flow --      Pain Score 06/23/20 1650 9     Pain Loc --      Pain Edu? --      Excl. in [place]? --    Constitutional: Alert and oriented. Well appearing and in no acute distress. Eyes: Conjunctivae are normal. PERRL. EOMI. Head: Atraumatic. Nose: No congestion/rhinnorhea. Mouth/Throat: Mucous membranes are moist.  Oropharynx non-erythematous. Neck: No stridor. No tenderness to palpation of the midline or paraspinals of the cervical spine.  Cardiovascular: Normal rate, regular rhythm. Grossly normal heart sounds.  Good peripheral circulation. Respiratory: Normal respiratory effort.  No retractions. Lungs CTAB. Gastrointestinal: Soft and nontender. No distention. No abdominal bruits. No CVA tenderness. Musculoskeletal: Right Shoulder: Tenderness noted to the [facility] joint, anterior GH joint, no tenderness to the posterior shoulder or scapular region.  Right Elbow:  abrasion over the olecranon. Minimal tenderness. Full ROM of the elbow without difficulty. Remainder of right arm is pain free to palpation, radial pulse 2+, capillary refill WNL.  Right Knee: Mild abrasion noted to anterior right knee. Healed scars from prior replacement. Minimal anterior tenderness. ROM significantly limited by body habitus but is non painful. Left Ankle: Mild tenderness to palpation of the lateral ankle. ROM limited secondary to pain. Dorsal Pedal pulses symmetric bilaterally, capillary refill WNL.  Lumbar Spine: Tenderness  to palpation of the lumbar spine midline and paraspinals. No thoracic spine tenderness. Remainder of MSK exam is grossly normal. Neurologic:  Normal speech and language. No gross focal neurologic deficits are appreciated.  Skin:  Skin is warm, dry and intact. No rash noted. Psychiatric: Mood and affect are normal. Speech and behavior are normal.  ____________________________________________  RADIOLOGY I, Marlana Salvage, personally viewed and evaluated these images (plain radiographs) as part of my medical decision making, as well as reviewing the written report by the radiologist.  ED provider interpretation:  Arthritic/degenerative changes noted on lumbar spine. Right shoulder with possible lucency along the inferior scapular border but otherwise normal. Remainder of xrays of the elbow, left ankle and right knee appear grossly without acute abnormality  Official radiology report(s): DG Lumbar Spine 2-3 Views  Result Date: 06/23/2020 CLINICAL DATA:  Status post fall with low back pain. EXAM: LUMBAR SPINE - 2-3 VIEW COMPARISON:  March 23, 2017 FINDINGS: There is no evidence of lumbar spine fracture. Scoliosis of the spine is identified. Marked degenerative joint changes with narrowed joint space and osteophyte formation are identified throughout lumbar spine. IMPRESSION: No acute fracture or dislocation.  degenerative joint changes. Electronically Signed    By: Abelardo Diesel M.D.   On: 06/23/2020 18:34   DG Shoulder Right  Result Date: 06/23/2020 CLINICAL DATA:  Status post fall with right shoulder pain. EXAM: RIGHT SHOULDER - 2+ VIEW COMPARISON:  None. FINDINGS: There is question fracture of the inferior right scapula. No other acute fracture or dislocation is identified. IMPRESSION: There is question fracture of the inferior right scapula. No other acute fracture or dislocation is identified. Electronically Signed   By: Abelardo Diesel M.D.   On: 06/23/2020 18:36   DG Elbow Complete Right  Result Date: 06/23/2020 CLINICAL DATA:  Status post fall with right elbow pain. EXAM: RIGHT ELBOW - COMPLETE 3+ VIEW COMPARISON:  None. FINDINGS: There is no evidence of fracture, dislocation, or joint effusion. There is no evidence of arthropathy or other focal bone abnormality. Soft tissues are unremarkable. IMPRESSION: Negative. Electronically Signed   By: Abelardo Diesel M.D.   On: 06/23/2020 18:36   DG Ankle Complete Left  Result Date: 06/23/2020 CLINICAL DATA:  Status post fall with left ankle pain. EXAM: LEFT ANKLE COMPLETE - 3+ VIEW COMPARISON:  November 18, 2009 FINDINGS: There is no evidence of fracture, dislocation, or joint effusion. There is no evidence of arthropathy or other focal bone abnormality. Generalized soft tissue swelling noted around the ankle. IMPRESSION: No acute fracture or dislocation. Generalized soft tissue swelling. Electronically Signed   By: Abelardo Diesel M.D.   On: 06/23/2020 18:38   DG Knee Complete 4 Views Right  Result Date: 06/23/2020 CLINICAL DATA:  Status post fall with right knee pain. EXAM: RIGHT KNEE - COMPLETE 4+ VIEW COMPARISON:  May 21, 2008 FINDINGS: No evidence of fracture, dislocation, or joint effusion. Right knee replacement is identified. Soft tissues are unremarkable. IMPRESSION: No acute fracture or dislocation. Right knee replacement. Electronically Signed   By: Abelardo Diesel M.D.   On: 06/23/2020 18:37      ____________________________________________   INITIAL IMPRESSION / ASSESSMENT AND PLAN / ED COURSE  As part of my medical decision making, I reviewed the following data within the [place] notes reviewed and incorporated, Radiograph reviewed, and Notes from prior ED visits        Patient is a 60 year old female who presents to the emergency department after mechanical fall  outside a restaurant.  See HPI for further details.  In triage she is mildly hypertensive but otherwise has normal vital signs.  Physical exam demonstrates abrasions of the right elbow and right knee as well as tenderness of the right shoulder, right elbow, right knee and left ankle.  Her primary largest complaint is low back pain with a history of chronic low back pain.  She denies any chest pain, shortness of breath, abdominal pain.  X-rays were obtained of her primary areas of complaint.  Lumbar spine does show to some degenerative changes without any acute abnormality identified.  Right shoulder demonstrates a possible lucency over the inferior scapular border, however she does not have any tenderness in this region and feel that this is likely not an acute fracture.  X-rays of the right elbow right knee and left ankle are without any acute abnormality.  Treat the patient's pain with Tylenol as well as lidocaine patch of the lumbar spine given that she is unable to take NSAIDs due to CKD.  After this, she was able to tolerate ambulation around our facility with a walker which is what she uses at home.  Discussed treating with this at home and she is amenable with plan.  She stable this time for outpatient follow-up.  Return precautions were discussed.      ____________________________________________   FINAL CLINICAL IMPRESSION(S) / ED DIAGNOSES  Final diagnoses:  Fall, initial encounter  Bilateral low back pain with bilateral sciatica, unspecified chronicity  Acute pain of right  shoulder  Right elbow pain  Acute pain of right knee  Acute left ankle pain     ED Discharge Orders     None        Note:  This document was prepared using Dragon voice recognition software and may include unintentional dictation errors.    Marlana Salvage, PA 06/23/20 2315    Vladimir Crofts, MD 06/24/20 2252

## 2020-06-23 NOTE — Discharge Instructions (Addendum)
Please use Tylenol, up to 1000mg  4x daily as needed for pain. You may also apply lidocaine patches per the package instructions. These are available over the counter as [employer] Strategy Officer. Return to ER for any worsening. Otherwise, follow up with PCP.

## 2020-06-23 NOTE — ED Triage Notes (Signed)
Patient had mechanical trip and fall while walking out of restaurant, patient reports landing on concrete. Denies hitting head. Patient reports back pain, and abrasion to right elbow. Patient A&OX3.

## 2020-06-29 ENCOUNTER — Telehealth: Payer: 59 | Admitting: Psychiatry

## 2020-07-06 ENCOUNTER — Other Ambulatory Visit: Payer: Self-pay | Admitting: Psychiatry

## 2020-07-06 DIAGNOSIS — G4701 Insomnia due to medical condition: Secondary | ICD-10-CM

## 2020-07-06 DIAGNOSIS — F411 Generalized anxiety disorder: Secondary | ICD-10-CM

## 2020-07-25 ENCOUNTER — Other Ambulatory Visit (INDEPENDENT_AMBULATORY_CARE_PROVIDER_SITE_OTHER): Payer: Self-pay | Admitting: Vascular Surgery

## 2020-07-25 DIAGNOSIS — I739 Peripheral vascular disease, unspecified: Secondary | ICD-10-CM

## 2020-07-26 ENCOUNTER — Ambulatory Visit (INDEPENDENT_AMBULATORY_CARE_PROVIDER_SITE_OTHER): Payer: 59 | Admitting: Nurse Practitioner

## 2020-07-26 ENCOUNTER — Encounter (INDEPENDENT_AMBULATORY_CARE_PROVIDER_SITE_OTHER): Payer: Self-pay | Admitting: Nurse Practitioner

## 2020-07-26 ENCOUNTER — Other Ambulatory Visit: Payer: Self-pay

## 2020-07-26 ENCOUNTER — Ambulatory Visit (INDEPENDENT_AMBULATORY_CARE_PROVIDER_SITE_OTHER): Payer: 59

## 2020-07-26 VITALS — BP 169/82 | HR 104 | Resp 18 | Ht 69.0 in | Wt >= 6400 oz

## 2020-07-26 DIAGNOSIS — N1832 Chronic kidney disease, stage 3b: Secondary | ICD-10-CM | POA: Diagnosis not present

## 2020-07-26 DIAGNOSIS — G8929 Other chronic pain: Secondary | ICD-10-CM

## 2020-07-26 DIAGNOSIS — E1169 Type 2 diabetes mellitus with other specified complication: Secondary | ICD-10-CM | POA: Diagnosis not present

## 2020-07-26 DIAGNOSIS — L819 Disorder of pigmentation, unspecified: Secondary | ICD-10-CM

## 2020-07-26 DIAGNOSIS — I739 Peripheral vascular disease, unspecified: Secondary | ICD-10-CM | POA: Diagnosis not present

## 2020-07-26 DIAGNOSIS — R600 Localized edema: Secondary | ICD-10-CM | POA: Diagnosis not present

## 2020-07-26 DIAGNOSIS — M5441 Lumbago with sciatica, right side: Secondary | ICD-10-CM

## 2020-07-26 DIAGNOSIS — I10 Essential (primary) hypertension: Secondary | ICD-10-CM | POA: Diagnosis not present

## 2020-07-26 DIAGNOSIS — E785 Hyperlipidemia, unspecified: Secondary | ICD-10-CM

## 2020-07-31 ENCOUNTER — Encounter (INDEPENDENT_AMBULATORY_CARE_PROVIDER_SITE_OTHER): Payer: Self-pay | Admitting: Nurse Practitioner

## 2020-07-31 NOTE — Progress Notes (Signed)
Subjective:    Patient ID: Lorraine Hutchinson, female    DOB: 1960-01-30, 60 y.o.   MRN: [id number] Chief Complaint  Patient presents with   New Patient (Initial Visit)    Ref [place] kidney disease,type 2 diabetes,abi consult    Lorraine Hutchinson is a 60 year old female that presents today as referral from Dr. Zollie Scale for concern for discoloration and lower extremity pain.  The patient also has significant swelling in her bilateral lower extremities.  She has stage III chronic kidney disease.  She does endorse some pain is activity in her right leg is worse.  She has pain radiating from her hip down her leg.  She also does have a area of back pain issues.  She denies any rest pain like symptoms.  She denies any fevers or chills.  Today noninvasive studies show an ABI of 1.02 bilaterally.  She has a TBI of 0.74 on the right and 0.97 on the left.  The patient has triphasic/biphasic waveforms in the right lower extremity with triphasic waveforms in the left lower extremity.  Patient has good toe waveforms bilaterally.   Review of Systems  Cardiovascular:  Positive for leg swelling.  Musculoskeletal:  Positive for arthralgias and back pain.  Skin:  Positive for color change.  All other systems reviewed and are negative.     Objective:   Physical Exam Vitals reviewed.  Constitutional:      Appearance: She is obese.  HENT:     Head: Normocephalic.  Cardiovascular:     Rate and Rhythm: Normal rate.  Pulmonary:     Effort: Pulmonary effort is normal.  Musculoskeletal:     Right lower leg: 3+ Edema present.     Left lower leg: 3+ Edema present.  Neurological:     Mental Status: She is alert and oriented to person, place, and time.  Psychiatric:        Mood and Affect: Mood normal.        Behavior: Behavior normal.        Thought Content: Thought content normal.        Judgment: Judgment normal.    BP (!) 169/82 (BP Location: Right Arm)   Pulse (!) 104   Resp 18   Ht 5\' 9"  (1.753 m)    Wt (!) 420 lb 9.6 oz (190.8 kg)   BMI 62.11 kg/m   Past Medical History:  Diagnosis Date   Anxiety    Cancer ([place])    cervical   Depression    Diabetes mellitus without complication ([place])    Hyperlipidemia    Hypertension    Scoliosis    Sleep apnea     Social History   Socioeconomic History   Marital status: Widowed    Spouse name: arthur   Number of children: 2   Years of education: Not on file   Highest education level: 11th grade  Occupational History   Not on file  Tobacco Use   Smoking status: Former    Packs/day: 1.00    Years: 40.00    Pack years: 40.00    Types: Cigarettes    Quit date: 08/15/2017    Years since quitting: 2.9   Smokeless tobacco: Never  Vaping Use   Vaping Use: Never used  Substance and Sexual Activity   Alcohol use: No    Alcohol/week: 0.0 standard drinks   Drug use: Yes    Types: Marijuana   Sexual activity: Not Currently  Other Topics Concern  Not on file  Social History Narrative   Not on file   Social Determinants of Health   Financial Resource Strain: Not on file  Food Insecurity: Not on file  Transportation Needs: Not on file  Physical Activity: Not on file  Stress: Not on file  Social Connections: Not on file  Intimate Partner Violence: Not on file    Past Surgical History:  Procedure Laterality Date   ABDOMINAL HYSTERECTOMY     APPENDECTOMY     CESAREAN SECTION     REPLACEMENT TOTAL KNEE Right    TONSILLECTOMY      Family History  Problem Relation Age of Onset   Diabetes Father    Diabetes Mother    Cancer Mother    Anxiety disorder Sister    Depression Sister     Allergies  Allergen Reactions   Codeine Hives    No flowsheet data found.    CMP     Component Value Date/Time   NA 145 (H) 09/18/2016 0909   K 4.9 09/18/2016 0909   CL 104 09/18/2016 0909   CO2 24 09/18/2016 0909   GLUCOSE 157 (H) 09/18/2016 0909   BUN 19 09/18/2016 0909   CREATININE 1.00 09/18/2016 0909   CALCIUM 9.7  09/18/2016 0909   PROT 6.8 09/18/2016 0909   ALBUMIN 4.3 09/18/2016 0909   AST 17 09/18/2016 0909   ALT 18 09/18/2016 0909   ALKPHOS 33 (L) 09/18/2016 0909   BILITOT 0.3 09/18/2016 0909   GFRNONAA 62 09/18/2016 0909   GFRAA 72 09/18/2016 0909     VAS [place] ABI WITH/WO TBI  Result Date: 07/26/2020  LOWER EXTREMITY DOPPLER STUDY Patient Name:  Lorraine Hutchinson  Date of Exam:   07/26/2020 Medical Rec #: [id number]         Accession #:    [id number] Date of Birth: 1960/01/18         Patient Gender: F Patient Age:   89Y Exam Location:  [facility] Procedure:      VAS [place] ABI WITH/WO TBI Referring Phys: [id number] [place] --------------------------------------------------------------------------------  Indications: Rest pain.  Performing Technologist: Charlane Ferretti RT (R)(VS)  Examination Guidelines: A complete evaluation includes at minimum, Doppler waveform signals and systolic blood pressure reading at the level of bilateral brachial, anterior tibial, and posterior tibial arteries, when vessel segments are accessible. Bilateral testing is considered an integral part of a complete examination. Photoelectric Plethysmograph (PPG) waveforms and toe systolic pressure readings are included as required and additional duplex testing as needed. Limited examinations for reoccurring indications may be performed as noted.  ABI Findings: +---------+------------------+-----+---------+--------+ Right    Rt Pressure (mmHg)IndexWaveform Comment  +---------+------------------+-----+---------+--------+ Brachial 147                                      +---------+------------------+-----+---------+--------+ ATA      150               1.02 triphasic         +---------+------------------+-----+---------+--------+ PTA      138               0.94 biphasic          +---------+------------------+-----+---------+--------+ Great Toe109               0.74 Normal             +---------+------------------+-----+---------+--------+ +---------+------------------+-----+---------+-------+ Left  Lt Pressure (mmHg)IndexWaveform Comment +---------+------------------+-----+---------+-------+ Brachial 133                                     +---------+------------------+-----+---------+-------+ ATA      138               0.94 triphasic        +---------+------------------+-----+---------+-------+ PTA      150               1.02 triphasic        +---------+------------------+-----+---------+-------+ Great Toe142               0.97 Normal           +---------+------------------+-----+---------+-------+ Summary: Right: Resting right ankle-brachial index is within normal range. No evidence of significant right lower extremity arterial disease. The right toe-brachial index is normal. Left: Resting left ankle-brachial index is within normal range. No evidence of significant left lower extremity arterial disease. The left toe-brachial index is normal. *See table(s) above for measurements and observations.  Electronically signed by Leotis Pain MD on 07/26/2020 at 5:13:50 PM.    Final        Assessment & Plan:   1. Localized edema I have had a long discussion with the patient regarding swelling and why it  causes symptoms.  Patient will begin wearing graduated compression stockings class 1 (20-30 mmHg) on a daily basis a prescription was given. The patient will  beginning wearing the stockings first thing in the morning and removing them in the evening. The patient is instructed specifically not to sleep in the stockings.   In addition, behavioral modification will be initiated.  This will include frequent elevation, use of over the counter pain medications and exercise such as walking.  Consideration for a lymph pump will also be made based upon the effectiveness of conservative therapy.  This would help to improve the edema control and prevent sequela such as  ulcers and infections   Patient should undergo duplex ultrasound of the venous system to ensure that DVT or reflux is not present.  The patient will follow-up with me after the ultrasound.    2. Essential hypertension Continue antihypertensive medications as already ordered, these medications have been reviewed and there are no changes at this time.   3. Hyperlipidemia due to type 2 diabetes mellitus ([place]) Continue statin as ordered and reviewed, no changes at this time   4. Stage 3b chronic kidney disease ([place]) This will also likely contribute to edema.  5. Chronic low back pain (Primary Area of Pain) (Bilateral) (R>L) Likely the source of the patient's right lower extremity pain and discomfort, contributing to her difficulty with ambulation   6. Discoloration of skin of toe ABIs are normal.  Likely related to chronic venous insufficiency, this will be evaluated with her upcoming noninvasive studies.  Current Outpatient Medications on File Prior to Visit  Medication Sig Dispense Refill   ACCU-CHEK GUIDE test strip USE (1 STRIP TOTAL) 3 (THREE) TIMES DAILY USE AS INSTRUCTED.     albuterol (VENTOLIN HFA) 108 (90 Base) MCG/ACT inhaler Inhale into the lungs.     albuterol (VENTOLIN HFA) 108 (90 Base) MCG/ACT inhaler Inhale into the lungs.     B Complex Vitamins (VITAMIN B COMPLEX) TABS Take by mouth.     B Complex-Biotin-FA (TH VITAMIN B 50/B-COMPLEX) TABS Take 1 tablet by mouth daily.     B-D  UF III MINI PEN NEEDLES 31G X 5 MM MISC U UTD 2 XD  3   buPROPion (WELLBUTRIN) 75 MG tablet Take 1 tablet (75 mg total) by mouth daily. 90 tablet 0   busPIRone (BUSPAR) 10 MG tablet Take 1 tablet (10 mg total) by mouth 2 (two) times daily. 180 tablet 1   Calcium Carbonate-Vitamin D 600-200 MG-UNIT TABS Take by mouth.     Calcium-Vitamin D 600-200 MG-UNIT tablet Take 1 tablet by mouth 2 (two) times daily.     Cinnamon 500 MG capsule Take 2,000 mg by mouth daily.      ciprofloxacin (CIPRO) 500 MG  tablet Take 500 mg by mouth 2 (two) times daily.     clonazePAM (KLONOPIN) 0.5 MG tablet Take 1 tablet (0.5 mg total) by mouth as directed. Start taking 2-3 times a week for severe anxiety attacks only 21 tablet 0   diltiazem (DILACOR XR) 180 MG 24 hr capsule Take 180 mg by mouth 2 (two) times daily.     DULoxetine (CYMBALTA) 60 MG capsule Take 1 capsule (60 mg total) by mouth daily. 90 capsule 1   insulin aspart protamine - aspart (NOVOLOG 70/30 MIX) (70-30) 100 UNIT/ML FlexPen Inject 60 Units into the skin daily before breakfast. 62 units before supper     Multiple Vitamins-Minerals (ABC PLUS SENIOR ADULTS 50+ PO) Take by mouth.     omeprazole (PRILOSEC) 20 MG capsule Take 20 mg by mouth daily.     phentermine (ADIPEX-P) 37.5 MG tablet Take 37.5 mg by mouth every morning.     Potassium 99 MG TABS Take 1 tablet by mouth daily.     pregabalin (LYRICA) 25 MG capsule Take by mouth.     torsemide (DEMADEX) 10 MG tablet Take by mouth.     TRELEGY ELLIPTA 100-62.5-25 MCG/INH AEPB Inhale 1 puff into the lungs daily.     zolpidem (AMBIEN) 10 MG tablet TAKE 1/2 TABLET (5 MG TOTAL) BY MOUTH AT BEDTIME AS NEEDED FOR SLEEP. 15 tablet 1   furosemide (LASIX) 20 MG tablet Take by mouth.     pravastatin (PRAVACHOL) 40 MG tablet Take 40 mg by mouth daily. (Patient not taking: No sig reported)     No current facility-administered medications on file prior to visit.    There are no Patient Instructions on file for this visit. No follow-ups on file.   Kris Hartmann, NP

## 2020-08-01 ENCOUNTER — Other Ambulatory Visit: Payer: Self-pay

## 2020-08-01 ENCOUNTER — Telehealth (INDEPENDENT_AMBULATORY_CARE_PROVIDER_SITE_OTHER): Payer: 59 | Admitting: Psychiatry

## 2020-08-01 ENCOUNTER — Encounter: Payer: Self-pay | Admitting: Psychiatry

## 2020-08-01 DIAGNOSIS — G4701 Insomnia due to medical condition: Secondary | ICD-10-CM

## 2020-08-01 DIAGNOSIS — F3342 Major depressive disorder, recurrent, in full remission: Secondary | ICD-10-CM

## 2020-08-01 DIAGNOSIS — F411 Generalized anxiety disorder: Secondary | ICD-10-CM

## 2020-08-01 DIAGNOSIS — F330 Major depressive disorder, recurrent, mild: Secondary | ICD-10-CM

## 2020-08-01 MED ORDER — BUPROPION HCL 75 MG PO TABS: 75.0000 mg | ORAL_TABLET | Freq: Every day | ORAL | 0 refills | Status: DC

## 2020-08-01 MED ORDER — DULOXETINE HCL 60 MG PO CPEP: 60.0000 mg | ORAL_CAPSULE | Freq: Every day | ORAL | 1 refills | Status: DC

## 2020-08-01 NOTE — Progress Notes (Signed)
Virtual Visit via Video Note  I connected with [name] on 08/01/20 at  3:00 PM EDT by a video enabled telemedicine application and verified that I am speaking with the correct person using two identifiers.  Location Provider Location : [facility] Patient Location : Home  Participants: Patient , Provider   I discussed the limitations of evaluation and management by telemedicine and the availability of in person appointments. The patient expressed understanding and agreed to proceed.    I discussed the assessment and treatment plan with the patient. The patient was provided an opportunity to ask questions and all were answered. The patient agreed with the plan and demonstrated an understanding of the instructions.   The patient was advised to call back or seek an in-person evaluation if the symptoms worsen or if the condition fails to improve as anticipated.   [place] MD OP Progress Note  08/01/2020 3:22 PM Lorraine Hutchinson  MRN:  [id number]  Chief Complaint:  Chief Complaint   Follow-up; Anxiety; Depression    HPI: Lorraine Hutchinson is a 60 year old Caucasian female, widowed, lives in [place], has a history of MDD, GAD, insomnia, OSA on CPAP, chronic pain, morbid obesity was evaluated by telemedicine today.  Patient today reports she continues to be in pain, has tingling due to neuropathy of her bilateral feet.  That does affect her especially at night.  She however reports sleep is overall okay.  She quit taking the Ambien since it makes her groggy the next day.  In spite of stopping the Ambien she has been sleeping okay.  She is currently on Lyrica which helps.  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  She however does not believe the BuSpar is beneficial.  She would also want to get off of some of these medications since she feels she is on multiple medications, polypharmacy.  She is compliant on her Cymbalta, BuSpar, Wellbutrin.  Patient  denies any suicidality, homicidality or perceptual disturbances.  She is currently on antibiotic, ciprofloxacin for UTI.  She continues to have burning as well as urinary urgency and frequency.  Patient denies any other concerns today.  Visit Diagnosis:    ICD-10-CM   1. MDD (major depressive disorder), recurrent, in full remission ([place])  F33.42 buPROPion (WELLBUTRIN) 75 MG tablet    2. GAD (generalized anxiety disorder)  F41.1 DULoxetine (CYMBALTA) 60 MG capsule    3. Insomnia due to medical condition  G47.01    OSA,Pain      Past Psychiatric History: I have reviewed past psychiatric history from progress note on 04/16/2017.  Past trials of medications like Elavil, Seroquel, Pamelor, Ambien, trazodone, melatonin, Lunesta, Sonata, Rozerem  Past Medical History:  Past Medical History:  Diagnosis Date   Anxiety    Cancer ([place])    cervical   Depression    Diabetes mellitus without complication ([place])    Hyperlipidemia    Hypertension    Scoliosis    Sleep apnea     Past Surgical History:  Procedure Laterality Date   ABDOMINAL HYSTERECTOMY     APPENDECTOMY     CESAREAN SECTION     REPLACEMENT TOTAL KNEE Right    TONSILLECTOMY      Family Psychiatric History: Reviewed family psychiatric history from progress note on 04/16/2017  Family History:  Family History  Problem Relation Age of Onset   Diabetes Father    Diabetes Mother    Cancer Mother    Anxiety  disorder Sister    Depression Sister     Social History: Reviewed social history from progress note on 04/16/2017 Social History   Socioeconomic History   Marital status: Widowed    Spouse name: arthur   Number of children: 2   Years of education: Not on file   Highest education level: 11th grade  Occupational History   Not on file  Tobacco Use   Smoking status: Former    Packs/day: 1.00    Years: 40.00    Pack years: 40.00    Types: Cigarettes    Quit date: 08/15/2017    Years since quitting: 2.9   Smokeless  tobacco: Never  Vaping Use   Vaping Use: Never used  Substance and Sexual Activity   Alcohol use: No    Alcohol/week: 0.0 standard drinks   Drug use: Yes    Types: Marijuana   Sexual activity: Not Currently  Other Topics Concern   Not on file  Social History Narrative   Not on file   Social Determinants of Health   Financial Resource Strain: Not on file  Food Insecurity: Not on file  Transportation Needs: Not on file  Physical Activity: Not on file  Stress: Not on file  Social Connections: Not on file    Allergies:  Allergies  Allergen Reactions   Codeine Hives    Metabolic Disorder Labs: No results found for: HGBA1C, MPG No results found for: PROLACTIN No results found for: CHOL, TRIG, HDL, CHOLHDL, VLDL, LDLCALC No results found for: TSH  Therapeutic Level Labs: No results found for: LITHIUM No results found for: VALPROATE No components found for:  CBMZ  Current Medications: Current Outpatient Medications  Medication Sig Dispense Refill   ACCU-CHEK GUIDE test strip USE (1 STRIP TOTAL) 3 (THREE) TIMES DAILY USE AS INSTRUCTED.     albuterol (VENTOLIN HFA) 108 (90 Base) MCG/ACT inhaler Inhale into the lungs.     albuterol (VENTOLIN HFA) 108 (90 Base) MCG/ACT inhaler Inhale into the lungs.     B Complex Vitamins (VITAMIN B COMPLEX) TABS Take by mouth.     B Complex-Biotin-FA (TH VITAMIN B 50/B-COMPLEX) TABS Take 1 tablet by mouth daily.     B-D UF III MINI PEN NEEDLES 31G X 5 MM MISC U UTD 2 XD  3   buPROPion (WELLBUTRIN) 75 MG tablet Take 1 tablet (75 mg total) by mouth daily. 90 tablet 0   Calcium Carbonate-Vitamin D 600-200 MG-UNIT TABS Take by mouth.     Calcium-Vitamin D 600-200 MG-UNIT tablet Take 1 tablet by mouth 2 (two) times daily.     Cinnamon 500 MG capsule Take 2,000 mg by mouth daily.      ciprofloxacin (CIPRO) 500 MG tablet Take 500 mg by mouth 2 (two) times daily.     clonazePAM (KLONOPIN) 0.5 MG tablet Take 1 tablet (0.5 mg total) by mouth as  directed. Start taking 2-3 times a week for severe anxiety attacks only 21 tablet 0   diltiazem (DILACOR XR) 180 MG 24 hr capsule Take 180 mg by mouth 2 (two) times daily.     DULoxetine (CYMBALTA) 60 MG capsule Take 1 capsule (60 mg total) by mouth daily. 90 capsule 1   furosemide (LASIX) 20 MG tablet Take by mouth.     insulin aspart protamine - aspart (NOVOLOG 70/30 MIX) (70-30) 100 UNIT/ML FlexPen Inject 60 Units into the skin daily before breakfast. 62 units before supper     Multiple Vitamins-Minerals (ABC PLUS SENIOR ADULTS 50+  PO) Take by mouth.     omeprazole (PRILOSEC) 20 MG capsule Take 20 mg by mouth daily.     phentermine (ADIPEX-P) 37.5 MG tablet Take 37.5 mg by mouth every morning.     Potassium 99 MG TABS Take 1 tablet by mouth daily.     pravastatin (PRAVACHOL) 40 MG tablet Take 40 mg by mouth daily. (Patient not taking: No sig reported)     pregabalin (LYRICA) 25 MG capsule Take by mouth.     torsemide (DEMADEX) 10 MG tablet Take by mouth.     TRELEGY ELLIPTA 100-62.5-25 MCG/INH AEPB Inhale 1 puff into the lungs daily.     No current facility-administered medications for this visit.     Musculoskeletal: Strength & Muscle Tone:  UTA Gait & Station:  UTA Patient leans: Backward  Psychiatric Specialty Exam: Review of Systems  Constitutional:  Positive for fatigue.  Genitourinary:  Positive for dysuria, frequency and urgency.  All other systems reviewed and are negative.  There were no vitals taken for this visit.There is no height or weight on file to calculate BMI.  General Appearance: Casual  Eye Contact:  Fair  Speech:  Clear and Coherent  Volume:  Normal  Mood:  Anxious  Affect:  Full Range  Thought Process:  Goal Directed and Descriptions of Associations: Intact  Orientation:  Full (Time, Place, and Person)  Thought Content: Logical   Suicidal Thoughts:  No  Homicidal Thoughts:  No  Memory:  Immediate;   Fair Recent;   Fair Remote;   Fair  Judgement:   Fair  Insight:  Fair  Psychomotor Activity:  Normal  Concentration:  Concentration: Fair and Attention Span: Fair  Recall:  [employer] of Knowledge: Fair  Language: Fair  Akathisia:  No  Handed:  Right  AIMS (if indicated): not done  Assets:  Communication Skills Desire for Improvement Housing Social Support  ADL's:  Intact  Cognition: WNL  Sleep:  Fair   Screenings: PHQ2-9    Flowsheet Row Video Visit from 08/01/2020 in [facility] Video Visit from 04/19/2020 in [facility] Video Visit from 03/17/2020 in [facility] Visit from 12/03/2016 in [facility] Procedure visit from 11/15/2016 in [facility]  PHQ-2 Total Score 1 5 3  0 0  PHQ-9 Total Score 7 11 10  -- --      Flowsheet Row Video Visit from 04/19/2020 in [facility] Video Visit from 03/17/2020 in [facility] No Risk No Risk        Assessment and Plan: Lorraine Hutchinson is a 60 year old Caucasian female, widowed, lives in [place], has a history of MDD, GAD, insomnia was evaluated by telemedicine today.  Patient is currently struggling with anxiety and will benefit from psychotherapy sessions.  Discussed plan as noted below.  Plan MDD-in remission Wellbutrin 75 mg p.o. daily Cymbalta 60 mg p.o. daily Discontinue BuSpar for lack of benefit  GAD-unstable Continue Cymbalta as prescribed Discontinue BuSpar for lack of benefit Refer for CBT for her anxiety.  I have sent communication to staff.  Insomnia-stable Continue CPAP Discontinue Ambien for side effects  Follow-up in clinic in 4 weeks or sooner if needed.  This note was generated in part or whole with voice recognition software. Voice recognition is usually quite accurate but there are transcription errors that can  and very often do occur. I apologize  for any typographical errors that were not detected and corrected.     Ursula Alert, MD 08/02/2020, 8:23 AM

## 2020-08-16 ENCOUNTER — Other Ambulatory Visit: Payer: Self-pay | Admitting: Psychiatry

## 2020-08-16 DIAGNOSIS — F411 Generalized anxiety disorder: Secondary | ICD-10-CM

## 2020-08-18 ENCOUNTER — Other Ambulatory Visit (INDEPENDENT_AMBULATORY_CARE_PROVIDER_SITE_OTHER): Payer: Self-pay | Admitting: Nurse Practitioner

## 2020-08-18 DIAGNOSIS — R600 Localized edema: Secondary | ICD-10-CM

## 2020-08-23 ENCOUNTER — Encounter (INDEPENDENT_AMBULATORY_CARE_PROVIDER_SITE_OTHER): Payer: 59

## 2020-08-23 ENCOUNTER — Ambulatory Visit (INDEPENDENT_AMBULATORY_CARE_PROVIDER_SITE_OTHER): Payer: 59 | Admitting: Vascular Surgery

## 2020-09-02 ENCOUNTER — Other Ambulatory Visit: Payer: Self-pay | Admitting: Psychiatry

## 2020-09-02 DIAGNOSIS — F411 Generalized anxiety disorder: Secondary | ICD-10-CM

## 2020-09-05 ENCOUNTER — Other Ambulatory Visit: Payer: Self-pay

## 2020-09-05 ENCOUNTER — Encounter: Payer: Self-pay | Admitting: Psychiatry

## 2020-09-05 ENCOUNTER — Ambulatory Visit (INDEPENDENT_AMBULATORY_CARE_PROVIDER_SITE_OTHER): Payer: 59 | Admitting: Licensed Clinical Social Worker

## 2020-09-05 ENCOUNTER — Telehealth (INDEPENDENT_AMBULATORY_CARE_PROVIDER_SITE_OTHER): Payer: 59 | Admitting: Psychiatry

## 2020-09-05 DIAGNOSIS — F411 Generalized anxiety disorder: Secondary | ICD-10-CM

## 2020-09-05 DIAGNOSIS — G4701 Insomnia due to medical condition: Secondary | ICD-10-CM

## 2020-09-05 DIAGNOSIS — F4010 Social phobia, unspecified: Secondary | ICD-10-CM

## 2020-09-05 DIAGNOSIS — F3342 Major depressive disorder, recurrent, in full remission: Secondary | ICD-10-CM | POA: Diagnosis not present

## 2020-09-05 NOTE — Progress Notes (Signed)
Virtual Visit via Video Note  I connected with Lorraine Hutchinson on 09/05/20 at  3:00 PM EDT by a video enabled telemedicine application and verified that I am speaking with the correct person using two identifiers.  Location: Patient: home Provider: remote office Lorraine Hutchinson, [place])   I discussed the limitations of evaluation and management by telemedicine and the availability of in person appointments. The patient expressed understanding and agreed to proceed.  I discussed the assessment and treatment plan with the patient. The patient was provided an opportunity to ask questions and all were answered. The patient agreed with the plan and demonstrated an understanding of the instructions.   The patient was advised to call back or seek an in-person evaluation if the symptoms worsen or if the condition fails to improve as anticipated.  I provided 45 minutes of non-face-to-face time during this encounter.   Lorraine Hessling R Lorraine Pafford, LCSW  THERAPIST PROGRESS NOTE  Session Time: 3-3:45p  Participation Level: Active  Behavioral Response: Neat and Well GroomedAlertAnxious and Depressed  Type of Therapy: Individual Therapy  Treatment Goals addressed: Anxiety, Coping, and Diagnosis: depression  Interventions: CBT and DBT  Summary: Lorraine Hutchinson is a 60 y.o. female who presents with continuing symptoms related to depression diagnosis. Allowed pt safe space to explore thoughts and feelings about recent external stressors and life events. Pt reports that she has a lot of fears about going out in public. Pt reports that she frequently won't leave the house unless she is going to a doctors appt. Pt reports that she has fallen several times in the past few months. Reviewed coping skills for depression and anxiety. Helped pt identify anxiety/depression triggers.  Continued recommendations are as follows: self care behaviors, positive social engagements, focusing on overall work/home/life balance, and  focusing on positive physical and emotional wellness.   Suicidal/Homicidal: No  Therapist Response: Reviewed/updated CCA and development of Tx plan.   Plan: Return again in 4 weeks.  Diagnosis: Axis I: MDD, recurrent; GAD    Axis II: No diagnosis    [place], LCSW 09/05/2020

## 2020-09-05 NOTE — Progress Notes (Signed)
Virtual Visit via Video Note  I connected with Lorraine Hutchinson on 09/06/20 at  4:00 PM EDT by a video enabled telemedicine application and verified that I am speaking with the correct person using two identifiers.  Location Provider Location : [facility] Patient Location : Home  Participants: Patient , Provider    I discussed the limitations of evaluation and management by telemedicine and the availability of in person appointments. The patient expressed understanding and agreed to proceed.    I discussed the assessment and treatment plan with the patient. The patient was provided an opportunity to ask questions and all were answered. The patient agreed with the plan and demonstrated an understanding of the instructions.   The patient was advised to call back or seek an in-person evaluation if the symptoms worsen or if the condition fails to improve as anticipated.  Video connection was lost at less than 50% of the duration of the visit, at which time the remainder of the visit was completed through audio only                                                                 [facility] MD OP Progress Note  09/06/2020 4:20 PM Lorraine Hutchinson  MRN:  [id number]  Chief Complaint:  Chief Complaint   Follow-up; Depression; Anxiety    HPI: Lorraine Hutchinson is a 60 year old Caucasian female, widowed, lives in [place], has a history of MDD, GAD, insomnia, OSA on CPAP, chronic pain, morbid obesity was evaluated by telemedicine today.  Patient today reports she is currently struggling with anxiety when she has to get out of the house or be in public place.  She hence avoids going into public places as much as possible.  She does not have significant anxiety if she can stay home all day.  She is currently in therapy and reports she is currently working with a therapist on the same.  Patient denies any significant depressive symptoms.  Denies any other anxiety symptoms like worry and generalized anxiety, which is  more under control.  She reports sleep is overall okay.  She is compliant on the CPAP.  She denies side effects to medications.  Patient with diabetes melitis, type II, CKD and microalbuminuria, followed by endocrinology and nephrology.  Recently had medication changes, however unsure what medication was recently added since she has not started taking it yet.  Reports hemoglobin A1c as uncontrolled at this time.  Patient denies any suicidality, homicidality or perceptual disturbances.  Patient denies any other concerns today.  Visit Diagnosis:    ICD-10-CM   1. MDD (major depressive disorder), recurrent, in full remission ([place])  F33.42     2. GAD (generalized anxiety disorder)  F41.1     3. Insomnia due to medical condition  G47.01    OSA, pain    4. Social anxiety disorder  F40.10       Past Psychiatric History: Reviewed past psychiatric history from progress note on 04/16/2017.  Past trials of medications like Elavil, Seroquel, Pamelor, Ambien, trazodone, melatonin, Lunesta, Sonata, Rozerem  Past Medical History:  Past Medical History:  Diagnosis Date   Anxiety    Cancer ([place])    cervical   Depression    Diabetes mellitus without complication ([place])  Hyperlipidemia    Hypertension    Scoliosis    Sleep apnea     Past Surgical History:  Procedure Laterality Date   ABDOMINAL HYSTERECTOMY     APPENDECTOMY     CESAREAN SECTION     REPLACEMENT TOTAL KNEE Right    TONSILLECTOMY      Family Psychiatric History: Reviewed family psychiatric history from progress note on 04/16/2017  Family History:  Family History  Problem Relation Age of Onset   Diabetes Father    Diabetes Mother    Cancer Mother    Anxiety disorder Sister    Depression Sister     Social History: Reviewed social history from progress note on 04/16/2017. Social History   Socioeconomic History   Marital status: Widowed    Spouse name: arthur   Number of children: 2   Years of education: Not on  file   Highest education level: 11th grade  Occupational History   Not on file  Tobacco Use   Smoking status: Former    Packs/day: 1.00    Years: 40.00    Pack years: 40.00    Types: Cigarettes    Quit date: 08/15/2017    Years since quitting: 3.0   Smokeless tobacco: Never  Vaping Use   Vaping Use: Never used  Substance and Sexual Activity   Alcohol use: No    Alcohol/week: 0.0 standard drinks   Drug use: Yes    Types: Marijuana   Sexual activity: Not Currently  Other Topics Concern   Not on file  Social History Narrative   Not on file   Social Determinants of Health   Financial Resource Strain: Not on file  Food Insecurity: Not on file  Transportation Needs: Not on file  Physical Activity: Not on file  Stress: Not on file  Social Connections: Not on file    Allergies:  Allergies  Allergen Reactions   Codeine Hives    Metabolic Disorder Labs: No results found for: HGBA1C, MPG No results found for: PROLACTIN No results found for: CHOL, TRIG, HDL, CHOLHDL, VLDL, LDLCALC No results found for: TSH  Therapeutic Level Labs: No results found for: LITHIUM No results found for: VALPROATE No components found for:  CBMZ  Current Medications: Current Outpatient Medications  Medication Sig Dispense Refill   diltiazem (TIAZAC) 180 MG 24 hr capsule Take by mouth.     ACCU-CHEK GUIDE test strip USE (1 STRIP TOTAL) 3 (THREE) TIMES DAILY USE AS INSTRUCTED.     albuterol (VENTOLIN HFA) 108 (90 Base) MCG/ACT inhaler Inhale into the lungs.     albuterol (VENTOLIN HFA) 108 (90 Base) MCG/ACT inhaler Inhale into the lungs.     B Complex Vitamins (VITAMIN B COMPLEX) TABS Take by mouth.     B Complex-Biotin-FA (TH VITAMIN B 50/B-COMPLEX) TABS Take 1 tablet by mouth daily.     B-D UF III MINI PEN NEEDLES 31G X 5 MM MISC U UTD 2 XD  3   buPROPion (WELLBUTRIN) 75 MG tablet Take 1 tablet (75 mg total) by mouth daily. 90 tablet 0   Calcium Carbonate-Vitamin D 600-200 MG-UNIT TABS Take  by mouth.     Calcium-Vitamin D 600-200 MG-UNIT tablet Take 1 tablet by mouth 2 (two) times daily.     Cinnamon 500 MG capsule Take 2,000 mg by mouth daily.      ciprofloxacin (CIPRO) 500 MG tablet Take 500 mg by mouth 2 (two) times daily.     clonazePAM (KLONOPIN) 0.5 MG tablet Take  1 tablet (0.5 mg total) by mouth as directed. Start taking 2-3 times a week for severe anxiety attacks only 21 tablet 0   cyanocobalamin 1000 MCG tablet Take by mouth.     diltiazem (DILACOR XR) 180 MG 24 hr capsule Take 180 mg by mouth 2 (two) times daily.     DULoxetine (CYMBALTA) 60 MG capsule Take 1 capsule (60 mg total) by mouth daily. 90 capsule 1   furosemide (LASIX) 20 MG tablet Take by mouth.     insulin aspart protamine - aspart (NOVOLOG 70/30 MIX) (70-30) 100 UNIT/ML FlexPen Inject 60 Units into the skin daily before breakfast. 62 units before supper     Multiple Vitamins-Minerals (ABC PLUS SENIOR ADULTS 50+ PO) Take by mouth.     omeprazole (PRILOSEC) 20 MG capsule Take 20 mg by mouth daily.     phentermine (ADIPEX-P) 37.5 MG tablet Take 37.5 mg by mouth every morning.     Potassium 99 MG TABS Take 1 tablet by mouth daily.     pravastatin (PRAVACHOL) 40 MG tablet Take 40 mg by mouth daily. (Patient not taking: No sig reported)     pregabalin (LYRICA) 25 MG capsule Take by mouth.     torsemide (DEMADEX) 10 MG tablet Take by mouth.     TRELEGY ELLIPTA 100-62.5-25 MCG/INH AEPB Inhale 1 puff into the lungs daily.     No current facility-administered medications for this visit.     Musculoskeletal: Strength & Muscle Tone:  UTA Gait & Station:  UTA Patient leans: N/A  Psychiatric Specialty Exam: Review of Systems  Psychiatric/Behavioral:  The patient is nervous/anxious.   All other systems reviewed and are negative.  There were no vitals taken for this visit.There is no height or weight on file to calculate BMI.  General Appearance:  UTA  Eye Contact:   UTA  Speech:  Clear and Coherent  Volume:   Normal  Mood:  Anxious  Affect:   UTA  Thought Process:  Goal Directed and Descriptions of Associations: Intact  Orientation:  Full (Time, Place, and Person)  Thought Content: Logical   Suicidal Thoughts:  No  Homicidal Thoughts:  No  Memory:  Immediate;   Fair Recent;   Fair Remote;   Limited  Judgement:  Fair  Insight:  Fair  Psychomotor Activity:   UTA  Concentration:  Concentration: Fair and Attention Span: Fair  Recall:  [employer] of Knowledge: Fair  Language: Fair  Akathisia:  No  Handed:  Right  AIMS (if indicated): done  Assets:  Communication Skills Lorraine for Improvement Housing Social Support  ADL's:  Intact  Cognition: WNL  Sleep:   Improving   Screenings: GAD-7    Flowsheet Row Counselor from 09/05/2020 in [facility]  Total GAD-7 Score 20      PHQ2-9    [place] Video Visit from 09/05/2020 in [facility] Video Visit from 08/01/2020 in [facility] Video Visit from 04/19/2020 in [facility] Video Visit from 03/17/2020 in [facility] Visit from 12/03/2016 in [facility]  PHQ-2 Total Score 0 '1 5 3 '$ 0  PHQ-9 Total Score -- '7 11 10 '$ --      Flowsheet Row Video Visit from 09/05/2020 in [facility] Most recent reading at 09/05/2020  4:17 PM Counselor from 09/05/2020 in [facility] Most recent reading at 09/05/2020  4:16 PM Video Visit from 04/19/2020  in [facility] Most recent reading at 04/19/2020  1:51 PM  C-SSRS RISK CATEGORY No Risk No Risk No Risk        Assessment and Plan: Lorraine Hutchinson is a 60 year old Caucasian female, widowed, lives in [place], has a history of MDD, GAD, insomnia was evaluated by telemedicine today.  Patient is currently stable on current medication regimen.   Plan as noted below.  Plan MDD in remission Wellbutrin 75 mg p.o. daily Cymbalta 60 mg p.o. daily  GAD-improving Cymbalta as prescribed Patient was referred for CBT-encouraged compliance  Insomnia-stable Continue CPAP  Social anxiety disorder-unstable Patient will benefit from psychotherapy sessions.   Follow-up in clinic in 2 months in office.   I have spent at least 19 minutes non face to face with patient today.  This note was generated in part or whole with voice recognition software. Voice recognition is usually quite accurate but there are transcription errors that can and very often do occur. I apologize for any typographical errors that were not detected and corrected.      Ursula Alert, MD 09/06/2020, 4:20 PM

## 2020-09-08 ENCOUNTER — Telehealth: Payer: Self-pay

## 2020-09-08 DIAGNOSIS — F411 Generalized anxiety disorder: Secondary | ICD-10-CM

## 2020-09-08 NOTE — Telephone Encounter (Signed)
received fax that refill of clonazepam.'5mg'$ , zolpidem tartrate 10 mg, Duloxetine hcl dr '60mg'$ , Hydroxyzine pam '25mg'$   clonazepam .'5mg'$  , gabapentin '400mg'$ 

## 2020-09-08 NOTE — Telephone Encounter (Signed)
Contacted patient, discussed with patient about her prescription request.  Patient reports she picked up all her prescriptions from [facility] and is currently not due.  She will let writer know when she is ready for a refill and at that time [facility]

## 2020-09-15 ENCOUNTER — Telehealth: Payer: 59

## 2020-09-15 DIAGNOSIS — F3342 Major depressive disorder, recurrent, in full remission: Secondary | ICD-10-CM

## 2020-09-15 DIAGNOSIS — F411 Generalized anxiety disorder: Secondary | ICD-10-CM

## 2020-09-15 MED ORDER — DULOXETINE HCL 60 MG PO CPEP: 60.0000 mg | ORAL_CAPSULE | Freq: Every day | ORAL | 1 refills | Status: DC

## 2020-09-15 MED ORDER — BUPROPION HCL 75 MG PO TABS: 75.0000 mg | ORAL_TABLET | Freq: Every day | ORAL | 1 refills | Status: DC

## 2020-09-15 MED ORDER — CLONAZEPAM 0.5 MG PO TABS: 0.5000 mg | ORAL_TABLET | ORAL | 2 refills | Status: DC

## 2020-09-15 NOTE — Telephone Encounter (Signed)
Medication refill requests - Call from [place], pharmacist with [facility] for refills for patient.  Collateral requested refills for pt's Clonazepam, Duloxetine, and Hydroxyzine and Ambien.  Informed Ambien was previously discontinued and collateral did not request a Wellbutrin order.  Requests all new orders for patient be e-scribed to [facility].

## 2020-09-15 NOTE — Telephone Encounter (Signed)
I have sent Wellbutrin, Klonopin, [facility]ymbalta [facility].  Patient is no longer on hydroxyzine and Ambien.

## 2020-09-21 ENCOUNTER — Other Ambulatory Visit: Payer: Self-pay | Admitting: Psychiatry

## 2020-09-21 DIAGNOSIS — F411 Generalized anxiety disorder: Secondary | ICD-10-CM

## 2020-09-22 ENCOUNTER — Other Ambulatory Visit: Payer: Self-pay | Admitting: Psychiatry

## 2020-10-06 ENCOUNTER — Ambulatory Visit: Payer: 59 | Admitting: Licensed Clinical Social Worker

## 2020-11-01 ENCOUNTER — Encounter: Payer: Self-pay | Admitting: Psychiatry

## 2020-11-01 ENCOUNTER — Ambulatory Visit (INDEPENDENT_AMBULATORY_CARE_PROVIDER_SITE_OTHER): Payer: 59 | Admitting: Psychiatry

## 2020-11-01 ENCOUNTER — Other Ambulatory Visit: Payer: Self-pay

## 2020-11-01 VITALS — BP 137/84 | HR 98 | Temp 98.5°F

## 2020-11-01 DIAGNOSIS — G4701 Insomnia due to medical condition: Secondary | ICD-10-CM

## 2020-11-01 DIAGNOSIS — F3342 Major depressive disorder, recurrent, in full remission: Secondary | ICD-10-CM | POA: Diagnosis not present

## 2020-11-01 DIAGNOSIS — F411 Generalized anxiety disorder: Secondary | ICD-10-CM

## 2020-11-01 DIAGNOSIS — F4010 Social phobia, unspecified: Secondary | ICD-10-CM

## 2020-11-01 DIAGNOSIS — R635 Abnormal weight gain: Secondary | ICD-10-CM | POA: Insufficient documentation

## 2020-11-01 NOTE — Patient Instructions (Signed)

## 2020-11-01 NOTE — Progress Notes (Signed)
[facility] MD OP Progress Note  11/01/2020 1:44 PM Lorraine Hutchinson  MRN:  [id number]  Chief Complaint:  Chief Complaint   Follow-up; Anxiety; Depression; Insomnia    HPI: Lorraine Hutchinson is a 60 year old Caucasian female, widowed, lives in [place], has a history of MDD, GAD, insomnia, OSA on CPAP, chronic pain, morbid obesity was evaluated in office today.  Patient today reports she is currently doing okay with regards to her mood.  Denies any significant depression.  She continues to have anxiety, episodes when she feels overwhelmed and shaky.  This usually happens when she has to go into a doctor's office.  It happened this morning.  She does have clonazepam available which she takes as needed and that seems to help.  Patient denies any sleep problems at this time.  She is no longer taking zolpidem.  She denies any suicidality, homicidality or perceptual disturbances.  Compliant on the Cymbalta and Wellbutrin.  Denies side effects.  Patient continues to struggle with morbid obesity, she is not currently active or exercising.  She eats only 2 meals per day however is not following any specific diet.  Patient is interested in weight management and agrees to reach out to her primary care provider for a referral to weight loss clinic.  Patient denies any other concerns today.  Visit Diagnosis:    ICD-10-CM   1. MDD (major depressive disorder), recurrent, in full remission ([place])  F33.42     2. GAD (generalized anxiety disorder)  F41.1     3. Insomnia due to medical condition  G47.01    OSA, Pain    4. Social anxiety disorder  F40.10     5. Multifactorial weight gain  R63.5       Past Psychiatric History: Reviewed past psychiatric history from progress note on 04/16/2017.  Past trials of medications like Elavil, Seroquel, Pamelor, Ambien, trazodone, melatonin, Lunesta, Sonata, Rozerem  Past Medical History:  Past Medical History:  Diagnosis Date   Anxiety    Cancer ([place])    cervical    Depression    Diabetes mellitus without complication ([place])    Hyperlipidemia    Hypertension    Scoliosis    Sleep apnea     Past Surgical History:  Procedure Laterality Date   ABDOMINAL HYSTERECTOMY     APPENDECTOMY     CESAREAN SECTION     REPLACEMENT TOTAL KNEE Right    TONSILLECTOMY      Family Psychiatric History: Reviewed family psychiatric history from progress note on 04/16/2017  Family History:  Family History  Problem Relation Age of Onset   Diabetes Father    Diabetes Mother    Cancer Mother    Anxiety disorder Sister    Depression Sister     Social History: Reviewed social history from progress note on 04/16/2017 Social History   Socioeconomic History   Marital status: Widowed    Spouse name: Lorraine Hutchinson   Number of children: 2   Years of education: Not on file   Highest education level: 11th grade  Occupational History   Not on file  Tobacco Use   Smoking status: Former    Packs/day: 1.00    Years: 40.00    Pack years: 40.00    Types: Cigarettes    Quit date: 08/15/2017    Years since quitting: 3.2   Smokeless tobacco: Never  Vaping Use   Vaping Use: Never used  Substance and Sexual Activity   Alcohol use: No  Alcohol/week: 0.0 standard drinks   Drug use: Yes    Types: Marijuana   Sexual activity: Not Currently  Other Topics Concern   Not on file  Social History Narrative   Not on file   Social Determinants of Health   Financial Resource Strain: Not on file  Food Insecurity: Not on file  Transportation Needs: Not on file  Physical Activity: Not on file  Stress: Not on file  Social Connections: Not on file    Allergies:  Allergies  Allergen Reactions   Codeine Hives    Metabolic Disorder Labs: No results found for: HGBA1C, MPG No results found for: PROLACTIN No results found for: CHOL, TRIG, HDL, CHOLHDL, VLDL, LDLCALC No results found for: TSH  Therapeutic Level Labs: No results found for: LITHIUM No results found for:  VALPROATE No components found for:  CBMZ  Current Medications: Current Outpatient Medications  Medication Sig Dispense Refill   ACCU-CHEK GUIDE test strip USE (1 STRIP TOTAL) 3 (THREE) TIMES DAILY USE AS INSTRUCTED.     albuterol (VENTOLIN HFA) 108 (90 Base) MCG/ACT inhaler Inhale into the lungs.     Alcohol Swabs (ALCOHOL WIPES) 70 % PADS Clean skin one to two times daily     B Complex Vitamins (VITAMIN B COMPLEX) TABS Take by mouth.     B Complex-Biotin-FA (TH VITAMIN B 50/B-COMPLEX) TABS Take 1 tablet by mouth daily.     buPROPion (WELLBUTRIN) 75 MG tablet Take 1 tablet (75 mg total) by mouth daily. 90 tablet 1   Calcium-Vitamin D 600-200 MG-UNIT tablet Take 1 tablet by mouth 2 (two) times daily.     Cinnamon 500 MG capsule Take 2,000 mg by mouth daily.      clonazePAM (KLONOPIN) 0.5 MG tablet Take 1 tablet (0.5 mg total) by mouth as directed. Start taking 2-3 times a week for severe anxiety attacks only 21 tablet 2   cyanocobalamin 1000 MCG tablet Take by mouth.     diltiazem (DILACOR XR) 180 MG 24 hr capsule Take 180 mg by mouth 2 (two) times daily.     diltiazem (TIAZAC) 180 MG 24 hr capsule Take by mouth.     DULoxetine (CYMBALTA) 60 MG capsule Take 1 capsule (60 mg total) by mouth daily. 90 capsule 1   gabapentin (NEURONTIN) 400 MG capsule Take by mouth.     insulin aspart protamine - aspart (NOVOLOG 70/30 MIX) (70-30) 100 UNIT/ML FlexPen Inject 60 Units into the skin daily before breakfast. 62 units before supper     Insulin Pen Needle (B-D UF III MINI PEN NEEDLES) 31G X 5 MM MISC 2 (two) times daily E11.65     Multiple Vitamins-Minerals (ABC PLUS SENIOR ADULTS 50+ PO) Take by mouth.     omeprazole (PRILOSEC) 20 MG capsule Take 20 mg by mouth daily.     Potassium 99 MG TABS Take 1 tablet by mouth daily.     pravastatin (PRAVACHOL) 40 MG tablet Take 40 mg by mouth daily.     pregabalin (LYRICA) 25 MG capsule Take by mouth.     Semaglutide,0.25 or 0.5MG /DOS, 2 MG/1.5ML SOPN Inject  into the skin.     torsemide (DEMADEX) 20 MG tablet Take by mouth.     TRELEGY ELLIPTA 100-62.5-25 MCG/INH AEPB Inhale 1 puff into the lungs daily.     furosemide (LASIX) 20 MG tablet Take by mouth.     phentermine (ADIPEX-P) 37.5 MG tablet Take 37.5 mg by mouth every morning. (Patient not taking: Reported on 11/01/2020)  zolpidem (AMBIEN) 10 MG tablet Take 5 mg by mouth at bedtime as needed. (Patient not taking: Reported on 11/01/2020)     No current facility-administered medications for this visit.     Musculoskeletal: Strength & Muscle Tone: within normal limits Gait & Station:  walks slowly due to being overweight  Patient leans: N/A  Psychiatric Specialty Exam: Review of Systems  Musculoskeletal:  Positive for arthralgias.  Psychiatric/Behavioral:  Negative for suicidal ideas. The patient is nervous/anxious.   All other systems reviewed and are negative.  Blood pressure 137/84, pulse 98, temperature 98.5 F (36.9 C), temperature source Temporal.There is no height or weight on file to calculate BMI.  General Appearance: Casual  Eye Contact:  Good  Speech:  Clear and Coherent  Volume:  Normal  Mood:  Anxious coping okay.  Affect:  Congruent  Thought Process:  Goal Directed and Descriptions of Associations: Intact  Orientation:  Full (Time, Place, and Person)  Thought Content: Logical   Suicidal Thoughts:  No  Homicidal Thoughts:  No  Memory:  Immediate;   Fair Recent;   Fair Remote;   Fair  Judgement:  Fair  Insight:  Fair  Psychomotor Activity:  Normal  Concentration:  Concentration: Fair and Attention Span: Fair  Recall:  [employer] of Knowledge: Fair  Language: Fair  Akathisia:  No  Handed:  Right  AIMS (if indicated): done  Assets:  Communication Skills Desire for Improvement Housing Social Support  ADL's:  Intact  Cognition: WNL  Sleep:  Fair   Screenings: GAD-7    Health and safety inspector from 09/05/2020 in [facility]   Total GAD-7 Score 20      PHQ2-9    [place] Visit from 11/01/2020 in [facility] Video Visit from 09/05/2020 in [facility] Video Visit from 08/01/2020 in [facility] Video Visit from 04/19/2020 in [facility] Video Visit from 03/17/2020 in [facility]  PHQ-2 Total Score 6 0 1 5 3   PHQ-9 Total Score 20 -- 7 11 10       [place] Visit from 11/01/2020 in [facility] Most recent reading at 11/01/2020  1:10 PM Video Visit from 09/05/2020 in [facility] Most recent reading at 09/05/2020  4:17 PM Counselor from 09/05/2020 in [place] Most recent reading at 09/05/2020  4:16 PM  C-SSRS RISK CATEGORY No Risk No Risk No Risk        Assessment and Plan: KAYLEANNA LORMAN is a 60 year old Caucasian female, widowed, lives in [place], has a history of MDD, GAD, insomnia, was evaluated in office today.  Patient with morbid obesity, which does have an impact on her quality of life, reports mood symptoms are stable.  Discussed plan as noted below.  Plan MDD in remission Wellbutrin 75 mg p.o. daily Cymbalta 60 mg p.o. daily AIMS - 0  GAD-stable Cymbalta as prescribed Klonopin 0.5 mg as needed for severe panic attacks only. Patient was referred for CBT-encouraged compliance.  Insomnia-stable Continue CPAP  Social anxiety disorder-some improvement Patient will continue CBT  Morbid obesity-multifactorial-unstable Discussed referral for weight loss and wellness clinic. Discussed diet management, exercise.  Follow-up in clinic in 3 months or sooner in person.  This note was generated in part or whole with voice recognition software. Voice recognition is usually quite accurate but there are transcription errors that can and very often do occur. I  apologize for any typographical  errors that were not detected and corrected.     Ursula Alert, MD 11/02/2020, 5:24 PM

## 2020-11-15 ENCOUNTER — Other Ambulatory Visit: Payer: Self-pay

## 2020-11-15 ENCOUNTER — Ambulatory Visit (INDEPENDENT_AMBULATORY_CARE_PROVIDER_SITE_OTHER): Payer: Self-pay | Admitting: Licensed Clinical Social Worker

## 2020-11-15 DIAGNOSIS — Z91199 Patient's noncompliance with other medical treatment and regimen due to unspecified reason: Secondary | ICD-10-CM

## 2020-11-15 NOTE — Progress Notes (Signed)
LCSW counselor tried to connect with patient for scheduled appointment via MyChart video text request x 2 and email request; also tried to connect via phone without success. LCSW counselor attempted to leave message but pt voicemail box was full.

## 2020-11-22 ENCOUNTER — Other Ambulatory Visit: Payer: Self-pay | Admitting: Psychiatry

## 2020-12-18 ENCOUNTER — Other Ambulatory Visit: Payer: Self-pay | Admitting: Psychiatry

## 2020-12-18 DIAGNOSIS — F411 Generalized anxiety disorder: Secondary | ICD-10-CM

## 2020-12-19 ENCOUNTER — Other Ambulatory Visit: Payer: Self-pay | Admitting: Psychiatry

## 2020-12-19 DIAGNOSIS — F411 Generalized anxiety disorder: Secondary | ICD-10-CM

## 2021-01-24 ENCOUNTER — Ambulatory Visit: Payer: 59 | Admitting: Psychiatry

## 2021-02-06 ENCOUNTER — Other Ambulatory Visit: Payer: Self-pay

## 2021-02-06 ENCOUNTER — Other Ambulatory Visit: Payer: Self-pay | Admitting: Pulmonary Disease

## 2021-02-06 ENCOUNTER — Other Ambulatory Visit: Payer: Self-pay | Admitting: Respiratory Therapy

## 2021-02-06 ENCOUNTER — Ambulatory Visit: Payer: 59 | Attending: Pulmonary Disease

## 2021-02-06 DIAGNOSIS — J449 Chronic obstructive pulmonary disease, unspecified: Secondary | ICD-10-CM | POA: Insufficient documentation

## 2021-02-06 LAB — BLOOD GAS, ARTERIAL
Acid-Base Excess: 7.6 mmol/L — ABNORMAL HIGH (ref 0.0–2.0)
Bicarbonate: 32.7 mmol/L — ABNORMAL HIGH (ref 20.0–28.0)
FIO2: 0.21
O2 Saturation: 96.1 %
Patient temperature: 37
pCO2 arterial: 47 mmHg (ref 32.0–48.0)
pH, Arterial: 7.45 (ref 7.350–7.450)
pO2, Arterial: 79 mmHg — ABNORMAL LOW (ref 83.0–108.0)

## 2021-02-07 ENCOUNTER — Other Ambulatory Visit: Payer: Self-pay | Admitting: Respiratory Therapy

## 2021-02-07 DIAGNOSIS — R0602 Shortness of breath: Secondary | ICD-10-CM

## 2021-02-08 ENCOUNTER — Ambulatory Visit: Payer: Self-pay | Admitting: Psychiatry

## 2021-02-21 ENCOUNTER — Other Ambulatory Visit: Payer: Self-pay | Admitting: Psychiatry

## 2021-02-21 DIAGNOSIS — F411 Generalized anxiety disorder: Secondary | ICD-10-CM

## 2021-02-27 ENCOUNTER — Telehealth (HOSPITAL_COMMUNITY): Payer: Self-pay

## 2021-02-27 NOTE — Telephone Encounter (Signed)
Patient called and stated that she missed her appointment and wants to explain to you what's going on. She also stated that her sugar is dropping and hat she's on a ventilator at home. She can be reached at [phone]. Thank you

## 2021-02-28 NOTE — Telephone Encounter (Signed)
Attempted to contact patient at the number provided-however voicemail is full.  Will have staff attempt to contact patient again to schedule a follow-up appointment.

## 2021-03-07 DIAGNOSIS — E662 Morbid (severe) obesity with alveolar hypoventilation: Secondary | ICD-10-CM | POA: Insufficient documentation

## 2021-03-07 DIAGNOSIS — E1169 Type 2 diabetes mellitus with other specified complication: Secondary | ICD-10-CM | POA: Diagnosis not present

## 2021-03-07 DIAGNOSIS — Z794 Long term (current) use of insulin: Secondary | ICD-10-CM | POA: Diagnosis not present

## 2021-03-07 DIAGNOSIS — N1832 Chronic kidney disease, stage 3b: Secondary | ICD-10-CM | POA: Diagnosis not present

## 2021-03-07 DIAGNOSIS — I10 Essential (primary) hypertension: Secondary | ICD-10-CM | POA: Diagnosis not present

## 2021-03-07 DIAGNOSIS — E559 Vitamin D deficiency, unspecified: Secondary | ICD-10-CM | POA: Diagnosis not present

## 2021-03-07 DIAGNOSIS — E785 Hyperlipidemia, unspecified: Secondary | ICD-10-CM | POA: Diagnosis not present

## 2021-03-07 DIAGNOSIS — E1165 Type 2 diabetes mellitus with hyperglycemia: Secondary | ICD-10-CM | POA: Diagnosis not present

## 2021-03-07 DIAGNOSIS — R69 Illness, unspecified: Secondary | ICD-10-CM | POA: Diagnosis not present

## 2021-03-07 DIAGNOSIS — E1122 Type 2 diabetes mellitus with diabetic chronic kidney disease: Secondary | ICD-10-CM | POA: Diagnosis not present

## 2021-03-17 ENCOUNTER — Telehealth: Payer: Self-pay | Admitting: Psychiatry

## 2021-03-17 NOTE — Telephone Encounter (Signed)
Called LVM TO sch ?

## 2021-03-29 ENCOUNTER — Other Ambulatory Visit: Payer: Self-pay | Admitting: Psychiatry

## 2021-04-07 ENCOUNTER — Telehealth: Payer: Self-pay | Admitting: Psychiatry

## 2021-04-07 DIAGNOSIS — F411 Generalized anxiety disorder: Secondary | ICD-10-CM

## 2021-04-07 DIAGNOSIS — F3342 Major depressive disorder, recurrent, in full remission: Secondary | ICD-10-CM

## 2021-04-07 MED ORDER — DULOXETINE HCL 60 MG PO CPEP: 60.0000 mg | ORAL_CAPSULE | Freq: Every day | ORAL | 0 refills | Status: DC

## 2021-04-07 MED ORDER — CLONAZEPAM 0.5 MG PO TABS: ORAL_TABLET | ORAL | 0 refills | Status: DC

## 2021-04-07 MED ORDER — BUPROPION HCL 75 MG PO TABS: 75.0000 mg | ORAL_TABLET | Freq: Every day | ORAL | 0 refills | Status: DC

## 2021-04-07 NOTE — Telephone Encounter (Signed)
Returned Advertising account executive from patient.requesting info about next appointment. No appointment on the schedule - last one was late cancel on 02/08/2021. Scheduled first available appointment for 05/18/2021. Patient then stated she will need prescriptions before that date. Routing this request to provider and CMA. ?

## 2021-04-07 NOTE — Telephone Encounter (Signed)
I have sent Wellbutrin and duloxetine to pharmacy.  Ambien was already sent on 03/30/2021. ?I have also sent 1 prescription for clonazepam. ? ?Patient will need to make sure she keeps her appointment for future refills. ?

## 2021-04-11 ENCOUNTER — Other Ambulatory Visit: Payer: Self-pay | Admitting: Psychiatry

## 2021-04-11 DIAGNOSIS — F411 Generalized anxiety disorder: Secondary | ICD-10-CM

## 2021-04-12 ENCOUNTER — Other Ambulatory Visit: Payer: Self-pay | Admitting: Psychiatry

## 2021-04-12 DIAGNOSIS — F411 Generalized anxiety disorder: Secondary | ICD-10-CM

## 2021-04-24 ENCOUNTER — Telehealth: Payer: Self-pay

## 2021-04-24 NOTE — Telephone Encounter (Signed)
Decline at this time as the patient requested refill soon. According to Dr. Andres Shad; instruction. "Sig: TAKE 1 TABLET BY MOUTH AS DIRECTED. START TAKING 2-3 TIMES A WEEK FOR SEVERE ANXIETY ATTACKS ONLY." The medication was ordered in late March. Will forward this to Dr. Shea Evans for review after she returns.

## 2021-04-24 NOTE — Telephone Encounter (Signed)
received a call from the pharmacy that pt needs a refill on the clonazepam .'5mg'$   ?

## 2021-04-27 NOTE — Telephone Encounter (Signed)
Agree with plan 

## 2021-05-18 ENCOUNTER — Telehealth (INDEPENDENT_AMBULATORY_CARE_PROVIDER_SITE_OTHER): Payer: 59 | Admitting: Psychiatry

## 2021-05-18 ENCOUNTER — Encounter: Payer: Self-pay | Admitting: Psychiatry

## 2021-05-18 DIAGNOSIS — F3342 Major depressive disorder, recurrent, in full remission: Secondary | ICD-10-CM | POA: Diagnosis not present

## 2021-05-18 DIAGNOSIS — F4010 Social phobia, unspecified: Secondary | ICD-10-CM

## 2021-05-18 DIAGNOSIS — G4701 Insomnia due to medical condition: Secondary | ICD-10-CM

## 2021-05-18 DIAGNOSIS — F411 Generalized anxiety disorder: Secondary | ICD-10-CM | POA: Diagnosis not present

## 2021-05-18 MED ORDER — ZOLPIDEM TARTRATE 10 MG PO TABS: ORAL_TABLET | ORAL | 1 refills | Status: DC

## 2021-05-18 MED ORDER — CLONAZEPAM 0.5 MG PO TABS: ORAL_TABLET | ORAL | 0 refills | Status: DC

## 2021-05-18 MED ORDER — BUPROPION HCL 75 MG PO TABS: 75.0000 mg | ORAL_TABLET | Freq: Every day | ORAL | 0 refills | Status: DC

## 2021-05-18 MED ORDER — DULOXETINE HCL 60 MG PO CPEP: 60.0000 mg | ORAL_CAPSULE | Freq: Every day | ORAL | 0 refills | Status: DC

## 2021-05-18 NOTE — Progress Notes (Signed)
Virtual Visit via Video Note ? ?I connected with Lorraine Hutchinson on 05/18/21 at  4:20 PM EDT by a video enabled telemedicine application and verified that I am speaking with the correct person using two identifiers. ? ?Location ?Provider Location : [facility] ?Patient Location : Home ? ?Participants: Patient , Provider ? ?  ?I discussed the limitations of evaluation and management by telemedicine and the availability of in person appointments. The patient expressed understanding and agreed to proceed. ? ?  ?I discussed the assessment and treatment plan with the patient. The patient was provided an opportunity to ask questions and all were answered. The patient agreed with the plan and demonstrated an understanding of the instructions. ?  ?The patient was advised to call back or seek an in-person evaluation if the symptoms worsen or if the condition fails to improve as anticipated. ? ? ?[facility] MD OP Progress Note ? ?05/18/2021 5:26 PM ?Lorraine Hutchinson  ?MRN:  [id number] ? ?Chief Complaint:  ?Chief Complaint  ?Patient presents with  ? Follow-up: 61 year old Caucasian female, widowed, lives in [place], with history of depression, anxiety, insomnia, multiple medical problems presented for medication management.  ? ?HPI: Lorraine Hutchinson is a 61 year old Caucasian female, widowed, lives in [place], has a history of MDD, GAD, insomnia, OSA on CPAP, chronic pain, morbid obesity was evaluated by telemedicine today. ? ?Patient today reports she had a hard time in 05-19-22 since it was the death anniversary of her husband.  Patient however reports she was able to cope better and currently feels much better. ? ?She reports she does have anxiety on and off although she is coping better than before.  She does take the Klonopin as needed and limited supply was provided to her in March.  Uses it every 3 days or so.  It does help when she takes it. ? ?Patient reports she is currently compliant on her psychotropics.  Denies side effects. ? ?Does  struggle with sleep at night.  However reports she does not have a good sleep hygiene.  She takes naps during the day.  Likely that could be contributing to sleep problems at night.  Patient was taken off of CPAP by pulmonologist, currently on BIPAP for OSA. ? ?Patient with morbidly obese, which does limit her physically.  BMI more than 66. ? ?Denies suicidality, homicidality or perceptual disturbances. ? ?Denies any other concerns today. ? ?Visit Diagnosis:  ?  ICD-10-CM   ?1. MDD (major depressive disorder), recurrent, in full remission ([place])  F33.42 DULoxetine (CYMBALTA) 60 MG capsule  ?  buPROPion (WELLBUTRIN) 75 MG tablet  ?  ?2. Generalized anxiety disorder  F41.1 clonazePAM (KLONOPIN) 0.5 MG tablet  ?  DULoxetine (CYMBALTA) 60 MG capsule  ?  ?3. Insomnia due to medical condition  G47.01 zolpidem (AMBIEN) 10 MG tablet  ? OSA, pain   ?  ?4. Social anxiety disorder  F40.10 DULoxetine (CYMBALTA) 60 MG capsule  ?  ? ? ?Past Psychiatric History: Reviewed past psychiatric history from progress note on 04/16/2017.  Past also medications like Elavil, Seroquel, Pamelor, Ambien, trazodone, melatonin, Lunesta, Sonata, Rozerem ? ?Past Medical History:  ?Past Medical History:  ?Diagnosis Date  ? Anxiety   ? Cancer [facility])   ? cervical  ? Depression   ? Diabetes mellitus without complication ([place])   ? Hyperlipidemia   ? Hypertension   ? Scoliosis   ? Sleep apnea   ?  ?Past Surgical History:  ?Procedure Laterality Date  ? ABDOMINAL HYSTERECTOMY    ?  APPENDECTOMY    ? CESAREAN SECTION    ? REPLACEMENT TOTAL KNEE Right   ? TONSILLECTOMY    ? ? ?Family Psychiatric History: Reviewed family psychiatric history from progress note on 04/16/2017. ? ?Family History:  ?Family History  ?Problem Relation Age of Onset  ? Diabetes Father   ? Diabetes Mother   ? Cancer Mother   ? Anxiety disorder Sister   ? Depression Sister   ? ? ?Social History: Reviewed social history from progress note on 04/16/2017. ?Social History  ? ?Socioeconomic History   ? Marital status: Widowed  ?  Spouse name: arthur  ? Number of children: 2  ? Years of education: Not on file  ? Highest education level: 11th grade  ?Occupational History  ? Not on file  ?Tobacco Use  ? Smoking status: Former  ?  Packs/day: 1.00  ?  Years: 40.00  ?  Pack years: 40.00  ?  Types: Cigarettes  ?  Quit date: 08/15/2017  ?  Years since quitting: 3.7  ? Smokeless tobacco: Never  ?Vaping Use  ? Vaping Use: Never used  ?Substance and Sexual Activity  ? Alcohol use: No  ?  Alcohol/week: 0.0 standard drinks  ? Drug use: Yes  ?  Types: Marijuana  ? Sexual activity: Not Currently  ?Other Topics Concern  ? Not on file  ?Social History Narrative  ? Not on file  ? ?Social Determinants of Health  ? ?Financial Resource Strain: Not on file  ?Food Insecurity: Not on file  ?Transportation Needs: Not on file  ?Physical Activity: Not on file  ?Stress: Not on file  ?Social Connections: Not on file  ? ? ?Allergies:  ?Allergies  ?Allergen Reactions  ? Codeine Hives  ? ? ?Metabolic Disorder Labs: ?No results found for: HGBA1C, MPG ?No results found for: PROLACTIN ?No results found for: CHOL, TRIG, HDL, CHOLHDL, VLDL, LDLCALC ?No results found for: TSH ? ?Therapeutic Level Labs: ?No results found for: LITHIUM ?No results found for: VALPROATE ?No components found for:  CBMZ ? ?Current Medications: ?Current Outpatient Medications  ?Medication Sig Dispense Refill  ? gabapentin (NEURONTIN) 400 MG capsule Take 1 capsule by mouth at bedtime.    ? metFORMIN (GLUCOPHAGE-XR) 500 MG 24 hr tablet Take 1 tablet by mouth 2 (two) times daily.    ? Semaglutide,0.25 or 0.'5MG'$ /DOS, 2 MG/1.5ML SOPN Inject into the skin.    ? ACCU-CHEK GUIDE test strip USE (1 STRIP TOTAL) 3 (THREE) TIMES DAILY USE AS INSTRUCTED.    ? albuterol (VENTOLIN HFA) 108 (90 Base) MCG/ACT inhaler Inhale into the lungs.    ? Alcohol Swabs (ALCOHOL WIPES) 70 % PADS Clean skin one to two times daily    ? B Complex Vitamins (VITAMIN B COMPLEX) TABS Take by mouth.    ? B  Complex-Biotin-FA (TH VITAMIN B 50/B-COMPLEX) TABS Take 1 tablet by mouth daily.    ? buPROPion (WELLBUTRIN) 75 MG tablet Take 1 tablet (75 mg total) by mouth daily. 90 tablet 0  ? Calcium-Vitamin D 600-200 MG-UNIT tablet Take 1 tablet by mouth 2 (two) times daily.    ? Cinnamon 500 MG capsule Take 2,000 mg by mouth daily.     ? clonazePAM (KLONOPIN) 0.5 MG tablet TAKE 1 TABLET BY MOUTH AS DIRECTED. START TAKING 2-3 TIMES A WEEK FOR SEVERE ANXIETY ATTACKS ONLY. 12 tablet 0  ? cyanocobalamin 1000 MCG tablet Take by mouth.    ? diltiazem (DILACOR XR) 180 MG 24 hr capsule Take 180 mg by  mouth 2 (two) times daily.    ? diltiazem (TIAZAC) 180 MG 24 hr capsule Take by mouth.    ? DULoxetine (CYMBALTA) 60 MG capsule Take 1 capsule (60 mg total) by mouth daily. 90 capsule 0  ? furosemide (LASIX) 20 MG tablet Take by mouth.    ? insulin aspart protamine - aspart (NOVOLOG 70/30 MIX) (70-30) 100 UNIT/ML FlexPen Inject 60 Units into the skin daily before breakfast. 62 units before supper    ? Insulin Pen Needle (B-D UF III MINI PEN NEEDLES) 31G X 5 MM MISC 2 (two) times daily E11.65    ? Multiple Vitamins-Minerals (ABC PLUS SENIOR ADULTS 50+ PO) Take by mouth.    ? omeprazole (PRILOSEC) 20 MG capsule Take 20 mg by mouth daily.    ? OZEMPIC, 0.25 OR 0.5 MG/DOSE, 2 MG/3ML SOPN Inject into the skin.    ? phentermine (ADIPEX-P) 37.5 MG tablet Take 37.5 mg by mouth every morning. (Patient not taking: Reported on 11/01/2020)    ? Potassium 99 MG TABS Take 1 tablet by mouth daily.    ? pravastatin (PRAVACHOL) 40 MG tablet Take 40 mg by mouth daily.    ? pregabalin (LYRICA) 25 MG capsule Take by mouth.    ? torsemide (DEMADEX) 20 MG tablet Take by mouth.    ? TRELEGY ELLIPTA 100-62.5-25 MCG/INH AEPB Inhale 1 puff into the lungs daily.    ? zolpidem (AMBIEN) 10 MG tablet TAKE 1/2 TABLET BY MOUTH AT BEDTIME AS NEEDED FOR SLEEP 15 tablet 1  ? ?No current facility-administered medications for this visit.  ? ? ? ?Musculoskeletal: ?Strength  & Muscle Tone:  UTA ?Gait & Station:  Seated ?Patient leans: Backward ? ?Psychiatric Specialty Exam: ?Review of Systems  ?Respiratory:  Positive for shortness of breath (chronic with exertion).   ?Psychiatric/Beh

## 2021-06-24 DIAGNOSIS — J9610 Chronic respiratory failure, unspecified whether with hypoxia or hypercapnia: Secondary | ICD-10-CM | POA: Diagnosis not present

## 2021-06-24 DIAGNOSIS — J449 Chronic obstructive pulmonary disease, unspecified: Secondary | ICD-10-CM | POA: Diagnosis not present

## 2021-07-06 DIAGNOSIS — E785 Hyperlipidemia, unspecified: Secondary | ICD-10-CM | POA: Diagnosis not present

## 2021-07-06 DIAGNOSIS — E1169 Type 2 diabetes mellitus with other specified complication: Secondary | ICD-10-CM | POA: Diagnosis not present

## 2021-07-06 DIAGNOSIS — Z794 Long term (current) use of insulin: Secondary | ICD-10-CM | POA: Diagnosis not present

## 2021-07-06 DIAGNOSIS — E1159 Type 2 diabetes mellitus with other circulatory complications: Secondary | ICD-10-CM | POA: Diagnosis not present

## 2021-07-06 DIAGNOSIS — E1165 Type 2 diabetes mellitus with hyperglycemia: Secondary | ICD-10-CM | POA: Diagnosis not present

## 2021-07-06 DIAGNOSIS — I152 Hypertension secondary to endocrine disorders: Secondary | ICD-10-CM | POA: Diagnosis not present

## 2021-07-12 ENCOUNTER — Other Ambulatory Visit: Payer: Self-pay | Admitting: Psychiatry

## 2021-07-12 DIAGNOSIS — F411 Generalized anxiety disorder: Secondary | ICD-10-CM

## 2021-07-13 DIAGNOSIS — E1165 Type 2 diabetes mellitus with hyperglycemia: Secondary | ICD-10-CM | POA: Diagnosis not present

## 2021-07-13 DIAGNOSIS — E1122 Type 2 diabetes mellitus with diabetic chronic kidney disease: Secondary | ICD-10-CM | POA: Diagnosis not present

## 2021-07-13 DIAGNOSIS — R600 Localized edema: Secondary | ICD-10-CM | POA: Diagnosis not present

## 2021-07-13 DIAGNOSIS — N1832 Chronic kidney disease, stage 3b: Secondary | ICD-10-CM | POA: Diagnosis not present

## 2021-07-13 DIAGNOSIS — Z794 Long term (current) use of insulin: Secondary | ICD-10-CM | POA: Diagnosis not present

## 2021-07-13 DIAGNOSIS — I10 Essential (primary) hypertension: Secondary | ICD-10-CM | POA: Diagnosis not present

## 2021-07-13 DIAGNOSIS — Z6841 Body Mass Index (BMI) 40.0 and over, adult: Secondary | ICD-10-CM | POA: Diagnosis not present

## 2021-07-13 DIAGNOSIS — E662 Morbid (severe) obesity with alveolar hypoventilation: Secondary | ICD-10-CM | POA: Diagnosis not present

## 2021-07-13 DIAGNOSIS — R0609 Other forms of dyspnea: Secondary | ICD-10-CM | POA: Diagnosis not present

## 2021-07-24 DIAGNOSIS — J449 Chronic obstructive pulmonary disease, unspecified: Secondary | ICD-10-CM | POA: Diagnosis not present

## 2021-07-24 DIAGNOSIS — J9610 Chronic respiratory failure, unspecified whether with hypoxia or hypercapnia: Secondary | ICD-10-CM | POA: Diagnosis not present

## 2021-07-25 DIAGNOSIS — G4733 Obstructive sleep apnea (adult) (pediatric): Secondary | ICD-10-CM | POA: Diagnosis not present

## 2021-07-25 DIAGNOSIS — E662 Morbid (severe) obesity with alveolar hypoventilation: Secondary | ICD-10-CM | POA: Diagnosis not present

## 2021-07-27 ENCOUNTER — Other Ambulatory Visit: Payer: Self-pay | Admitting: Psychiatry

## 2021-07-27 DIAGNOSIS — F3342 Major depressive disorder, recurrent, in full remission: Secondary | ICD-10-CM

## 2021-07-27 DIAGNOSIS — G4701 Insomnia due to medical condition: Secondary | ICD-10-CM

## 2021-08-17 ENCOUNTER — Ambulatory Visit: Payer: 59 | Admitting: Psychiatry

## 2021-08-22 ENCOUNTER — Other Ambulatory Visit: Payer: Self-pay | Admitting: Psychiatry

## 2021-08-22 DIAGNOSIS — F411 Generalized anxiety disorder: Secondary | ICD-10-CM

## 2021-08-24 DIAGNOSIS — J9610 Chronic respiratory failure, unspecified whether with hypoxia or hypercapnia: Secondary | ICD-10-CM | POA: Diagnosis not present

## 2021-08-24 DIAGNOSIS — J449 Chronic obstructive pulmonary disease, unspecified: Secondary | ICD-10-CM | POA: Diagnosis not present

## 2021-08-25 DIAGNOSIS — G4733 Obstructive sleep apnea (adult) (pediatric): Secondary | ICD-10-CM | POA: Diagnosis not present

## 2021-08-25 DIAGNOSIS — E662 Morbid (severe) obesity with alveolar hypoventilation: Secondary | ICD-10-CM | POA: Diagnosis not present

## 2021-08-28 ENCOUNTER — Other Ambulatory Visit: Payer: Self-pay | Admitting: Psychiatry

## 2021-08-28 DIAGNOSIS — F411 Generalized anxiety disorder: Secondary | ICD-10-CM

## 2021-08-30 DIAGNOSIS — E1169 Type 2 diabetes mellitus with other specified complication: Secondary | ICD-10-CM | POA: Diagnosis not present

## 2021-08-30 DIAGNOSIS — E785 Hyperlipidemia, unspecified: Secondary | ICD-10-CM | POA: Diagnosis not present

## 2021-08-30 DIAGNOSIS — E559 Vitamin D deficiency, unspecified: Secondary | ICD-10-CM | POA: Diagnosis not present

## 2021-08-30 DIAGNOSIS — Z794 Long term (current) use of insulin: Secondary | ICD-10-CM | POA: Diagnosis not present

## 2021-08-30 DIAGNOSIS — E1122 Type 2 diabetes mellitus with diabetic chronic kidney disease: Secondary | ICD-10-CM | POA: Diagnosis not present

## 2021-08-30 DIAGNOSIS — N1832 Chronic kidney disease, stage 3b: Secondary | ICD-10-CM | POA: Diagnosis not present

## 2021-09-03 ENCOUNTER — Other Ambulatory Visit: Payer: Self-pay | Admitting: Psychiatry

## 2021-09-03 DIAGNOSIS — F411 Generalized anxiety disorder: Secondary | ICD-10-CM

## 2021-09-05 DIAGNOSIS — E1169 Type 2 diabetes mellitus with other specified complication: Secondary | ICD-10-CM | POA: Diagnosis not present

## 2021-09-05 DIAGNOSIS — G4733 Obstructive sleep apnea (adult) (pediatric): Secondary | ICD-10-CM | POA: Diagnosis not present

## 2021-09-05 DIAGNOSIS — I10 Essential (primary) hypertension: Secondary | ICD-10-CM | POA: Diagnosis not present

## 2021-09-05 DIAGNOSIS — Z0000 Encounter for general adult medical examination without abnormal findings: Secondary | ICD-10-CM | POA: Diagnosis not present

## 2021-09-05 DIAGNOSIS — E1142 Type 2 diabetes mellitus with diabetic polyneuropathy: Secondary | ICD-10-CM | POA: Diagnosis not present

## 2021-09-05 DIAGNOSIS — J449 Chronic obstructive pulmonary disease, unspecified: Secondary | ICD-10-CM | POA: Diagnosis not present

## 2021-09-05 DIAGNOSIS — E1122 Type 2 diabetes mellitus with diabetic chronic kidney disease: Secondary | ICD-10-CM | POA: Diagnosis not present

## 2021-09-05 DIAGNOSIS — E1129 Type 2 diabetes mellitus with other diabetic kidney complication: Secondary | ICD-10-CM | POA: Diagnosis not present

## 2021-09-05 DIAGNOSIS — R69 Illness, unspecified: Secondary | ICD-10-CM | POA: Diagnosis not present

## 2021-09-05 DIAGNOSIS — F411 Generalized anxiety disorder: Secondary | ICD-10-CM | POA: Diagnosis not present

## 2021-09-05 DIAGNOSIS — E1165 Type 2 diabetes mellitus with hyperglycemia: Secondary | ICD-10-CM | POA: Diagnosis not present

## 2021-09-06 ENCOUNTER — Ambulatory Visit: Payer: 59 | Admitting: Psychiatry

## 2021-09-21 ENCOUNTER — Other Ambulatory Visit: Payer: Self-pay | Admitting: Psychiatry

## 2021-09-21 DIAGNOSIS — G4701 Insomnia due to medical condition: Secondary | ICD-10-CM

## 2021-09-21 DIAGNOSIS — F4010 Social phobia, unspecified: Secondary | ICD-10-CM

## 2021-09-21 DIAGNOSIS — F3342 Major depressive disorder, recurrent, in full remission: Secondary | ICD-10-CM

## 2021-09-21 DIAGNOSIS — F411 Generalized anxiety disorder: Secondary | ICD-10-CM

## 2021-09-24 DIAGNOSIS — J449 Chronic obstructive pulmonary disease, unspecified: Secondary | ICD-10-CM | POA: Diagnosis not present

## 2021-09-24 DIAGNOSIS — J9610 Chronic respiratory failure, unspecified whether with hypoxia or hypercapnia: Secondary | ICD-10-CM | POA: Diagnosis not present

## 2021-10-24 ENCOUNTER — Ambulatory Visit (INDEPENDENT_AMBULATORY_CARE_PROVIDER_SITE_OTHER): Payer: 59 | Admitting: Psychiatry

## 2021-10-24 ENCOUNTER — Encounter: Payer: Self-pay | Admitting: Psychiatry

## 2021-10-24 VITALS — BP 142/91 | HR 89 | Temp 98.2°F | Ht 69.0 in

## 2021-10-24 DIAGNOSIS — J449 Chronic obstructive pulmonary disease, unspecified: Secondary | ICD-10-CM | POA: Diagnosis not present

## 2021-10-24 DIAGNOSIS — F330 Major depressive disorder, recurrent, mild: Secondary | ICD-10-CM | POA: Diagnosis not present

## 2021-10-24 DIAGNOSIS — F411 Generalized anxiety disorder: Secondary | ICD-10-CM | POA: Diagnosis not present

## 2021-10-24 DIAGNOSIS — R69 Illness, unspecified: Secondary | ICD-10-CM | POA: Diagnosis not present

## 2021-10-24 DIAGNOSIS — G4701 Insomnia due to medical condition: Secondary | ICD-10-CM | POA: Diagnosis not present

## 2021-10-24 DIAGNOSIS — J9610 Chronic respiratory failure, unspecified whether with hypoxia or hypercapnia: Secondary | ICD-10-CM | POA: Diagnosis not present

## 2021-10-24 DIAGNOSIS — F4010 Social phobia, unspecified: Secondary | ICD-10-CM | POA: Diagnosis not present

## 2021-10-24 MED ORDER — DULOXETINE HCL 20 MG PO CPEP: 20.0000 mg | ORAL_CAPSULE | Freq: Every day | ORAL | 1 refills | Status: DC

## 2021-10-24 NOTE — Progress Notes (Unsigned)
[facility] MD/PA/NP OP Progress Note  10/24/2021 5:24 PM NEVILLE PAULS  MRN:  [id number]  Chief Complaint:  Chief Complaint  Patient presents with   Follow-up   Anxiety   HPI: Lorraine Hutchinson is a 61 year old Caucasian female, widowed, lives in [place], has a history of MDD, GAD, insomnia, OSA on CPAP, chronic pain, morbid obesity was evaluated in office today.  Patient reports she continues to struggle with depression and anxiety symptoms.  Patient reports she feels sad and has lack of motivation mostly because of her pain and inability to do certain things.  She gets tired easily.  She hence is unable to do anything that she enjoys.  This is likely due to her multiple medical problems, being a morbidly obese and being in pain all the time.  Patient also reports she is a Research officer, trade [employer], has been anxious, nervous, restless about things in general.  Patient reports current medications like Cymbalta helping to some extent however she is interested in dosage increase.  Currently denies any side effects.  She does continue to struggle with sleep.  However she is on CPAP for obstructive sleep apnea.  Patient reports she stopped taking the Ambien since she was advised not to use it with her CPAP.  She is agreeable to try lower dosage of melatonin.  She also may need pain management since pain likely affecting her sleep as well.  Patient denies any suicidality, homicidality or perceptual disturbances.  Patient denies any other concerns today.    Visit Diagnosis:    ICD-10-CM   1. MDD (major depressive disorder), recurrent episode, mild ([facility])  F33.0 DULoxetine (CYMBALTA) 20 MG capsule    2. Generalized anxiety disorder  F41.1 DULoxetine (CYMBALTA) 20 MG capsule    3. Insomnia due to medical condition  G47.01    OSA, pain    4. Social anxiety disorder  F40.10 DULoxetine (CYMBALTA) 20 MG capsule      Past Psychiatric History: Past psychiatric history from progress note on 04/16/2017.  Past trials of  medications like Elavil, Seroquel, Pamelor, Ambien, trazodone, melatonin, Lunesta, Sonata, Rozerem.  Past Medical History:  Past Medical History:  Diagnosis Date   Anxiety    Cancer ([place])    cervical   Depression    Diabetes mellitus without complication ([place])    Hyperlipidemia    Hypertension    Scoliosis    Sleep apnea     Past Surgical History:  Procedure Laterality Date   ABDOMINAL HYSTERECTOMY     APPENDECTOMY     CESAREAN SECTION     REPLACEMENT TOTAL KNEE Right    TONSILLECTOMY      Family Psychiatric History: Reviewed family psychiatric history from progress note on 04/16/2017.  Family History:  Family History  Problem Relation Age of Onset   Diabetes Father    Diabetes Mother    Cancer Mother    Anxiety disorder Sister    Depression Sister     Social History: Reviewed social history from progress note on 04/16/2017. Social History   Socioeconomic History   Marital status: Widowed    Spouse name: arthur   Number of children: 2   Years of education: Not on file   Highest education level: 11th grade  Occupational History   Not on file  Tobacco Use   Smoking status: Former    Packs/day: 1.00    Years: 40.00    Total pack years: 40.00    Types: Cigarettes    Quit date: 08/15/2017  Years since quitting: 4.1   Smokeless tobacco: Never  Vaping Use   Vaping Use: Never used  Substance and Sexual Activity   Alcohol use: No    Alcohol/week: 0.0 standard drinks of alcohol   Drug use: Yes    Types: Marijuana   Sexual activity: Not Currently  Other Topics Concern   Not on file  Social History Narrative   Not on file   Social Determinants of Health   Financial Resource Strain: Medium Risk (04/16/2017)   Overall Financial Resource Strain (CARDIA)    Difficulty of Paying Living Expenses: Somewhat hard  Food Insecurity: Food Insecurity Present (04/16/2017)   Hunger Vital Sign    Worried About Running Out of Food in the Last Year: Sometimes true    Ran Out of  Food in the Last Year: Sometimes true  Transportation Needs: No Transportation Needs (04/16/2017)   PRAPARE - Hydrologist (Medical): No    Lack of Transportation (Non-Medical): No  Physical Activity: Inactive (04/16/2017)   Exercise Vital Sign    Days of Exercise per Week: 0 days    Minutes of Exercise per Session: 0 min  Stress: Stress Concern Present (04/16/2017)   [place]    Feeling of Stress : Very much  Social Connections: Somewhat Isolated (04/16/2017)   Social Connection and Isolation Panel [NHANES]    Frequency of Communication with Friends and Family: More than three times a week    Frequency of Social Gatherings with Friends and Family: Three times a week    Attends Religious Services: Never    Active Member of Clubs or Organizations: No    Attends Archivist Meetings: Never    Marital Status: Married    Allergies:  Allergies  Allergen Reactions   Codeine Hives    Metabolic Disorder Labs: No results found for: "HGBA1C", "MPG" No results found for: "PROLACTIN" No results found for: "CHOL", "TRIG", "HDL", "CHOLHDL", "VLDL", "LDLCALC" No results found for: "TSH"  Therapeutic Level Labs: No results found for: "LITHIUM" No results found for: "VALPROATE" No results found for: "CBMZ"  Current Medications: Current Outpatient Medications  Medication Sig Dispense Refill   ACCU-CHEK GUIDE test strip USE (1 STRIP TOTAL) 3 (THREE) TIMES DAILY USE AS INSTRUCTED.     albuterol (VENTOLIN HFA) 108 (90 Base) MCG/ACT inhaler Inhale into the lungs.     Alcohol Swabs (ALCOHOL WIPES) 70 % PADS Clean skin one to two times daily     B Complex Vitamins (VITAMIN B COMPLEX) TABS Take by mouth.     buPROPion (WELLBUTRIN) 75 MG tablet TAKE 1 TABLET BY MOUTH DAILY *PATIENT NEEDS APPOINTMENT* 30 tablet 2   Calcium-Vitamin D 600-200 MG-UNIT tablet Take 1 tablet by mouth 2 (two) times daily.      Cinnamon 500 MG capsule Take 2,000 mg by mouth daily.      clonazePAM (KLONOPIN) 0.5 MG tablet TAKE 1 TABLET BY MOUTH 2-3 TIMES A WEEK FOR SEVERE ANXIETY ATTACKS ONLY 12 tablet 1   Continuous Blood Gluc Receiver (FREESTYLE LIBRE 2 READER) DEVI Use to monitor blood glucose.     cyanocobalamin 1000 MCG tablet Take by mouth.     diltiazem (DILACOR XR) 180 MG 24 hr capsule Take 180 mg by mouth 2 (two) times daily.     diltiazem (TIAZAC) 180 MG 24 hr capsule Take by mouth.     DULoxetine (CYMBALTA) 20 MG capsule Take 1 capsule (20 mg total)  by mouth daily. Take along with 60 mg daily 30 capsule 1   DULoxetine (CYMBALTA) 60 MG capsule TAKE 1 CAPSULE BY MOUTH DAILY 30 capsule 2   furosemide (LASIX) 20 MG tablet Take by mouth.     gabapentin (NEURONTIN) 400 MG capsule Take 1 capsule by mouth at bedtime.     insulin aspart protamine - aspart (NOVOLOG 70/30 MIX) (70-30) 100 UNIT/ML FlexPen Inject 60 Units into the skin daily before breakfast. 62 units before supper     Insulin Pen Needle (B-D UF III MINI PEN NEEDLES) 31G X 5 MM MISC 2 (two) times daily E11.65     metFORMIN (GLUCOPHAGE-XR) 500 MG 24 hr tablet Take 1 tablet by mouth 2 (two) times daily.     Multiple Vitamins-Minerals (ABC PLUS SENIOR ADULTS 50+ PO) Take by mouth.     omeprazole (PRILOSEC) 20 MG capsule Take 20 mg by mouth daily.     Potassium 99 MG TABS Take 1 tablet by mouth daily.     pravastatin (PRAVACHOL) 40 MG tablet Take 40 mg by mouth daily.     torsemide (DEMADEX) 20 MG tablet Take by mouth.     TRELEGY ELLIPTA 100-62.5-25 MCG/INH AEPB Inhale 1 puff into the lungs daily.     TRULICITY 1.5 ZO/1.0RU SOPN SMARTSIG:0.5 Milliliter(s) SUB-Q Once a Week     No current facility-administered medications for this visit.     Musculoskeletal: Strength & Muscle Tone: within normal limits Gait & Station:  wheelchair bound Patient leans: Backward  Psychiatric Specialty Exam: Review of Systems  Constitutional:  Positive for fatigue.   Musculoskeletal:  Positive for arthralgias and back pain.  Psychiatric/Behavioral:  Positive for dysphoric mood and sleep disturbance. The patient is nervous/anxious.   All other systems reviewed and are negative.   Blood pressure (!) 142/91, pulse 89, temperature 98.2 F (36.8 C), temperature source Oral, height '5\' 9"'$  (1.753 m).Body mass index is 62.11 kg/m.  General Appearance: Casual  Eye Contact:  Fair  Speech:  Clear and Coherent  Volume:  Normal  Mood:  Anxious and Depressed  Affect:  Congruent  Thought Process:  Goal Directed and Descriptions of Associations: Intact  Orientation:  Full (Time, Place, and Person)  Thought Content: Logical   Suicidal Thoughts:  No  Homicidal Thoughts:  No  Memory:  Immediate;   Fair Recent;   Fair Remote;   Fair  Judgement:  Fair  Insight:  Fair  Psychomotor Activity:  Normal  Concentration:  Concentration: Fair and Attention Span: Fair  Recall:  [employer] of Knowledge: Fair  Language: Fair  Akathisia:  No  Handed:  Right  AIMS (if indicated): not done  Assets:  Communication Skills Desire for Improvement Housing Social Support  ADL's:  Intact  Cognition: WNL  Sleep:  Poor   Screenings: GAD-7    Flowsheet Row Office Visit from 10/24/2021 in [facility] from 09/05/2020 in [facility]  Total GAD-7 Score 8 20      PHQ2-9    [facility] Visit from 10/24/2021 in [facility] Visit from 11/01/2020 in [facility] Video Visit from 09/05/2020 in [facility] Video Visit from 08/01/2020 in [facility] Video Visit from 04/19/2020 in [facility]  PHQ-2 Total Score 3 6 0 1 5  PHQ-9 Total Score 10 20 -- 7 [place] Office Visit from 10/24/2021 in [facility]  [facility] Visit from  11/01/2020 in [facility] Video Visit from 09/05/2020 in [facility] No Risk No Risk No Risk        Assessment and Plan: Lorraine Hutchinson is a 61 year old Caucasian female, widowed, lives in [place], has a history of MDD, GAD, insomnia was evaluated in office today.  Patient with multiple medical problems including COPD currently on BiPAP, morbid obesity, continues to struggle with sleep problems, anxiety and depression, will benefit from the following plan.  Plan MDD-unstable Wellbutrin 75 mg p.o. daily Increase Cymbalta to 80 mg p.o. daily  GAD-unstable Cymbalta increased to 80 mg p.o. daily Discussed referral for CBT, patient is not interested at this time. Klonopin 0.5 mg as needed for severe panic attacks only-patient to limit use.  Insomnia-unstable Continue BiPAP for OSA Discussed starting low dosage of melatonin 1 mg at bedtime Patient to work on sleep hygiene.  History of QT prolongation-patient advised to follow-up with cardiology.  Follow-up in clinic in 4 weeks or sooner if needed.  This note was generated in part or whole with voice recognition software. Voice recognition is usually quite accurate but there are transcription errors that can and very often do occur. I apologize for any typographical errors that were not detected and corrected.    Ursula Alert, MD 10/24/2021, 5:24 PM

## 2021-11-08 DIAGNOSIS — J9611 Chronic respiratory failure with hypoxia: Secondary | ICD-10-CM | POA: Diagnosis not present

## 2021-11-08 DIAGNOSIS — J449 Chronic obstructive pulmonary disease, unspecified: Secondary | ICD-10-CM | POA: Diagnosis not present

## 2021-11-13 ENCOUNTER — Encounter (INDEPENDENT_AMBULATORY_CARE_PROVIDER_SITE_OTHER): Payer: Self-pay

## 2021-11-20 ENCOUNTER — Other Ambulatory Visit: Payer: Self-pay | Admitting: Psychiatry

## 2021-11-20 DIAGNOSIS — G4733 Obstructive sleep apnea (adult) (pediatric): Secondary | ICD-10-CM | POA: Diagnosis not present

## 2021-11-20 DIAGNOSIS — E1122 Type 2 diabetes mellitus with diabetic chronic kidney disease: Secondary | ICD-10-CM | POA: Diagnosis not present

## 2021-11-20 DIAGNOSIS — N1831 Chronic kidney disease, stage 3a: Secondary | ICD-10-CM | POA: Diagnosis not present

## 2021-11-20 DIAGNOSIS — F411 Generalized anxiety disorder: Secondary | ICD-10-CM

## 2021-11-24 DIAGNOSIS — J9610 Chronic respiratory failure, unspecified whether with hypoxia or hypercapnia: Secondary | ICD-10-CM | POA: Diagnosis not present

## 2021-11-24 DIAGNOSIS — J449 Chronic obstructive pulmonary disease, unspecified: Secondary | ICD-10-CM | POA: Diagnosis not present

## 2021-12-11 ENCOUNTER — Telehealth (INDEPENDENT_AMBULATORY_CARE_PROVIDER_SITE_OTHER): Payer: 59 | Admitting: Psychiatry

## 2021-12-11 ENCOUNTER — Encounter: Payer: Self-pay | Admitting: Psychiatry

## 2021-12-11 DIAGNOSIS — F4010 Social phobia, unspecified: Secondary | ICD-10-CM | POA: Diagnosis not present

## 2021-12-11 DIAGNOSIS — F330 Major depressive disorder, recurrent, mild: Secondary | ICD-10-CM

## 2021-12-11 DIAGNOSIS — F411 Generalized anxiety disorder: Secondary | ICD-10-CM

## 2021-12-11 DIAGNOSIS — R69 Illness, unspecified: Secondary | ICD-10-CM | POA: Diagnosis not present

## 2021-12-11 DIAGNOSIS — G4701 Insomnia due to medical condition: Secondary | ICD-10-CM

## 2021-12-11 MED ORDER — DULOXETINE HCL 60 MG PO CPEP: 60.0000 mg | ORAL_CAPSULE | Freq: Every day | ORAL | 2 refills | Status: DC

## 2021-12-11 MED ORDER — BUPROPION HCL 75 MG PO TABS: ORAL_TABLET | ORAL | 2 refills | Status: DC

## 2021-12-11 MED ORDER — DULOXETINE HCL 20 MG PO CPEP: 20.0000 mg | ORAL_CAPSULE | Freq: Every day | ORAL | 2 refills | Status: DC

## 2021-12-11 NOTE — Progress Notes (Unsigned)
Virtual Visit via Video Note  I connected with Lorraine Hutchinson on 12/11/21 at  3:30 PM EST by a video enabled telemedicine application and verified that I am speaking with the correct person using two identifiers.  Location Provider Location : [facility] Patient Location : Home  Participants: Patient , Provider    I discussed the limitations of evaluation and management by telemedicine and the availability of in person appointments. The patient expressed understanding and agreed to proceed.   I discussed the assessment and treatment plan with the patient. The patient was provided an opportunity to ask questions and all were answered. The patient agreed with the plan and demonstrated an understanding of the instructions.   The patient was advised to call back or seek an in-person evaluation if the symptoms worsen or if the condition fails to improve as anticipated.   [name] MD OP Progress Note  12/12/2021 10:36 AM FARIHA GOTO  MRN:  [id number]  Chief Complaint:  Chief Complaint  Patient presents with   Follow-up   Medication Refill   Anxiety   Depression   Insomnia   HPI: Lorraine Hutchinson is a 61 year old Caucasian female, widowed, lives in [place], has a history of MDD, GAD, insomnia, OSA on CPAP, chronic pain, morbid obesity was evaluated by telemedicine today.  Patient today reports she had a good Thanksgiving holiday with her family.    Patient reports she believes her depression symptoms have improved.  She does feel sad on and off when she watches [employer] movies otherwise she has been fine.  She however does report she continues to have anxiety symptoms, worries about things in general.  Patient reports she has not started the higher dosage of Cymbalta yet and was waiting for her new dosage to arrive and complete the previous dosage supplies that she had left.  Patient reports sleep continues to be restless.  She has chronic pain as well as is on BiPAP.  Reports she is worried  about adding any sleep medications at night since she is on the BiPAP.  She currently gets around 4-1/2 hours of sleep only.  Patient denies any suicidality, homicidality or perceptual disturbances.  Patient denies any other concerns today.  Visit Diagnosis:    ICD-10-CM   1. MDD (major depressive disorder), recurrent episode, mild (HCC)  F33.0 buPROPion (WELLBUTRIN) 75 MG tablet    DULoxetine (CYMBALTA) 60 MG capsule    2. Generalized anxiety disorder  F41.1 DULoxetine (CYMBALTA) 60 MG capsule    DULoxetine (CYMBALTA) 20 MG capsule    3. Insomnia due to medical condition  G47.01    OSA, pain    4. Social anxiety disorder  F40.10 DULoxetine (CYMBALTA) 60 MG capsule    DULoxetine (CYMBALTA) 20 MG capsule      Past Psychiatric History: I have reviewed past psychiatric history from progress note on 04/16/2017.  Past trials of medications like Elavil, Seroquel, Pamelor, Ambien, trazodone, melatonin, Lunesta, Sonata, Rozerem.  Past Medical History:  Past Medical History:  Diagnosis Date   Anxiety    Cancer ([place])    cervical   Depression    Diabetes mellitus without complication ([place])    Hyperlipidemia    Hypertension    Scoliosis    Sleep apnea     Past Surgical History:  Procedure Laterality Date   ABDOMINAL HYSTERECTOMY     APPENDECTOMY     CESAREAN SECTION     REPLACEMENT TOTAL KNEE Right    TONSILLECTOMY  Family Psychiatric History: Reviewed family psychiatric history from progress note on 04/16/2017.  Family History:  Family History  Problem Relation Age of Onset   Diabetes Father    Diabetes Mother    Cancer Mother    Anxiety disorder Sister    Depression Sister     Social History: Reviewed social history from progress note on 04/16/2017. Social History   Socioeconomic History   Marital status: Widowed    Spouse name: Lorraine Hutchinson   Number of children: 2   Years of education: Not on file   Highest education level: 11th grade  Occupational History   Not on  file  Tobacco Use   Smoking status: Former    Packs/day: 1.00    Years: 40.00    Total pack years: 40.00    Types: Cigarettes    Quit date: 08/15/2017    Years since quitting: 4.3   Smokeless tobacco: Never  Vaping Use   Vaping Use: Never used  Substance and Sexual Activity   Alcohol use: No    Alcohol/week: 0.0 standard drinks of alcohol   Drug use: Yes    Types: Marijuana   Sexual activity: Not Currently  Other Topics Concern   Not on file  Social History Narrative   Not on file   Social Determinants of Health   Financial Resource Strain: Medium Risk (04/16/2017)   Overall Financial Resource Strain (CARDIA)    Difficulty of Paying Living Expenses: Somewhat hard  Food Insecurity: Food Insecurity Present (04/16/2017)   Hunger Vital Sign    Worried About Running Out of Food in the Last Year: Sometimes true    Ran Out of Food in the Last Year: Sometimes true  Transportation Needs: No Transportation Needs (04/16/2017)   PRAPARE - Hydrologist (Medical): No    Lack of Transportation (Non-Medical): No  Physical Activity: Inactive (04/16/2017)   Exercise Vital Sign    Days of Exercise per Week: 0 days    Minutes of Exercise per Session: 0 min  Stress: Stress Concern Present (04/16/2017)   [place]    Feeling of Stress : Very much  Social Connections: Somewhat Isolated (04/16/2017)   Social Connection and Isolation Panel [NHANES]    Frequency of Communication with Friends and Family: More than three times a week    Frequency of Social Gatherings with Friends and Family: Three times a week    Attends Religious Services: Never    Active Member of Clubs or Organizations: No    Attends Archivist Meetings: Never    Marital Status: Married    Allergies:  Allergies  Allergen Reactions   Codeine Hives    Metabolic Disorder Labs: No results found for: "HGBA1C", "MPG" No results  found for: "PROLACTIN" No results found for: "CHOL", "TRIG", "HDL", "CHOLHDL", "VLDL", "LDLCALC" No results found for: "TSH"  Therapeutic Level Labs: No results found for: "LITHIUM" No results found for: "VALPROATE" No results found for: "CBMZ"  Current Medications: Current Outpatient Medications  Medication Sig Dispense Refill   ACCU-CHEK GUIDE test strip USE (1 STRIP TOTAL) 3 (THREE) TIMES DAILY USE AS INSTRUCTED.     albuterol (VENTOLIN HFA) 108 (90 Base) MCG/ACT inhaler Inhale into the lungs.     Alcohol Swabs (ALCOHOL WIPES) 70 % PADS Clean skin one to two times daily     B Complex Vitamins (VITAMIN B COMPLEX) TABS Take by mouth.     Calcium-Vitamin D 600-200  MG-UNIT tablet Take 1 tablet by mouth 2 (two) times daily.     Cinnamon 500 MG capsule Take 2,000 mg by mouth daily.      clonazePAM (KLONOPIN) 0.5 MG tablet TAKE 1 TABLET BY MOUTH 2-3 TIMES A WEEK FOR SEVERE ANXIETY ATTACKS ONLY 12 tablet 1   Continuous Blood Gluc Receiver (FREESTYLE LIBRE 2 READER) DEVI Use to monitor blood glucose.     cyanocobalamin 1000 MCG tablet Take by mouth.     diltiazem (DILACOR XR) 180 MG 24 hr capsule Take 180 mg by mouth 2 (two) times daily.     diltiazem (TIAZAC) 180 MG 24 hr capsule Take by mouth.     gabapentin (NEURONTIN) 400 MG capsule Take 1 capsule by mouth at bedtime.     insulin aspart protamine - aspart (NOVOLOG 70/30 MIX) (70-30) 100 UNIT/ML FlexPen Inject 60 Units into the skin daily before breakfast. 62 units before supper     Insulin Pen Needle (B-D UF III MINI PEN NEEDLES) 31G X 5 MM MISC 2 (two) times daily E11.65     metFORMIN (GLUCOPHAGE-XR) 500 MG 24 hr tablet Take 1 tablet by mouth 2 (two) times daily.     Multiple Vitamins-Minerals (ABC PLUS SENIOR ADULTS 50+ PO) Take by mouth.     omeprazole (PRILOSEC) 20 MG capsule Take 20 mg by mouth daily.     Potassium 99 MG TABS Take 1 tablet by mouth daily.     pravastatin (PRAVACHOL) 40 MG tablet Take 40 mg by mouth daily.      TRELEGY ELLIPTA 100-62.5-25 MCG/INH AEPB Inhale 1 puff into the lungs daily.     TRULICITY 1.5 JJ/9.4RD SOPN SMARTSIG:0.5 Milliliter(s) SUB-Q Once a Week     buPROPion (WELLBUTRIN) 75 MG tablet TAKE 1 TABLET BY MOUTH DAILY 30 tablet 2   DULoxetine (CYMBALTA) 20 MG capsule Take 1 capsule (20 mg total) by mouth daily. Take along with 60 mg daily 30 capsule 2   DULoxetine (CYMBALTA) 60 MG capsule Take 1 capsule (60 mg total) by mouth daily. 30 capsule 2   furosemide (LASIX) 20 MG tablet Take by mouth.     torsemide (DEMADEX) 20 MG tablet Take by mouth. (Patient not taking: Reported on 12/11/2021)     No current facility-administered medications for this visit.     Musculoskeletal: Strength & Muscle Tone:  UTA Gait & Station:  Seated Patient leans: N/A  Psychiatric Specialty Exam: Review of Systems  Constitutional:  Positive for fatigue.  Musculoskeletal:  Positive for arthralgias.  Psychiatric/Behavioral:  Positive for sleep disturbance. The patient is nervous/anxious.   All other systems reviewed and are negative.   There were no vitals taken for this visit.There is no height or weight on file to calculate BMI.  General Appearance: Casual  Eye Contact:  Fair  Speech:  Clear and Coherent  Volume:  Normal  Mood:  Anxious  Affect:  Congruent  Thought Process:  Goal Directed and Descriptions of Associations: Intact  Orientation:  Full (Time, Place, and Person)  Thought Content: Logical   Suicidal Thoughts:  No  Homicidal Thoughts:  No  Memory:  Immediate;   Fair Recent;   Fair Remote;   Fair  Judgement:  Fair  Insight:  Fair  Psychomotor Activity:  Normal  Concentration:  Concentration: Fair and Attention Span: Fair  Recall:  [employer] of Knowledge: Fair  Language: Fair  Akathisia:  No  Handed:  Right  AIMS (if indicated): not done  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  Desire for Improvement Housing Social Support Transportation  ADL's:  Intact  Cognition: WNL  Sleep:    restless   Screenings: GAD-7    Flowsheet Row Office Visit from 10/24/2021 in [facility] from 09/05/2020 in [facility]  Total GAD-7 Score 8 20      PHQ2-9    [place] Visit from 10/24/2021 in [facility] Office Visit from 11/01/2020 in [place] [facility] [place] Video Visit from 09/05/2020 in [facility] Video Visit from 08/01/2020 in [facility] Video Visit from 04/19/2020 in [facility]  PHQ-2 Total Score 3 6 0 1 5  PHQ-9 Total Score 10 20 -- 7 11      Flowsheet Row Video Visit from 12/11/2021 in [facility] Office Visit from 10/24/2021 in [facility] Office Visit from 11/01/2020 in [facility] No Risk No Risk No Risk        Assessment and Plan: NYJAE HODGE is a 61 year old Caucasian female, widowed, lives in [place], has a history of MDD, GAD, insomnia was evaluated by telemedicine today.  Patient with some improvement of her depression symptoms although continues to have anxiety, sleep problems, noncompliant with medication readjustment as discussed last visit, will benefit from the following plan.  Plan as noted below.  Plan MDD-improving Continue Wellbutrin 75 mg p.o. daily Cymbalta increased to 80 mg p.o. daily-as discussed last visit.  GAD-unstable Increase Cymbalta to 80 mg p.o. daily Patient not interested in CBT Klonopin 0.5 mg as needed for severe panic attacks only-patient limiting use  Insomnia-some improvement Patient is not interested in sleep medication Currently on BiPAP for OSA patient to continue the same. Continue sleep hygiene techniques and pain management.  Follow-up in clinic in 2 months or sooner if needed.   Consent: Patient/Guardian  gives verbal consent for treatment and assignment of benefits for services provided during this visit. Patient/Guardian expressed understanding and agreed to proceed.   This note was generated in part or whole with voice recognition software. Voice recognition is usually quite accurate but there are transcription errors that can and very often do occur. I apologize for any typographical errors that were not detected and corrected.      Ursula Alert, MD 12/12/2021, 10:36 AM

## 2021-12-14 DIAGNOSIS — R9431 Abnormal electrocardiogram [ECG] [EKG]: Secondary | ICD-10-CM | POA: Diagnosis not present

## 2021-12-14 DIAGNOSIS — R0609 Other forms of dyspnea: Secondary | ICD-10-CM | POA: Diagnosis not present

## 2021-12-14 DIAGNOSIS — R079 Chest pain, unspecified: Secondary | ICD-10-CM | POA: Diagnosis not present

## 2021-12-14 DIAGNOSIS — R000 Tachycardia, unspecified: Secondary | ICD-10-CM | POA: Diagnosis not present

## 2021-12-14 DIAGNOSIS — E662 Morbid (severe) obesity with alveolar hypoventilation: Secondary | ICD-10-CM | POA: Diagnosis not present

## 2021-12-14 DIAGNOSIS — I10 Essential (primary) hypertension: Secondary | ICD-10-CM | POA: Diagnosis not present

## 2021-12-14 DIAGNOSIS — R0602 Shortness of breath: Secondary | ICD-10-CM | POA: Diagnosis not present

## 2021-12-19 ENCOUNTER — Other Ambulatory Visit: Payer: Self-pay | Admitting: Physician Assistant

## 2021-12-19 DIAGNOSIS — R0602 Shortness of breath: Secondary | ICD-10-CM

## 2021-12-19 DIAGNOSIS — R079 Chest pain, unspecified: Secondary | ICD-10-CM

## 2021-12-21 ENCOUNTER — Other Ambulatory Visit: Payer: Self-pay | Admitting: Psychiatry

## 2021-12-21 DIAGNOSIS — G4701 Insomnia due to medical condition: Secondary | ICD-10-CM

## 2021-12-24 DIAGNOSIS — J449 Chronic obstructive pulmonary disease, unspecified: Secondary | ICD-10-CM | POA: Diagnosis not present

## 2021-12-24 DIAGNOSIS — J9610 Chronic respiratory failure, unspecified whether with hypoxia or hypercapnia: Secondary | ICD-10-CM | POA: Diagnosis not present

## 2021-12-25 DIAGNOSIS — R9431 Abnormal electrocardiogram [ECG] [EKG]: Secondary | ICD-10-CM | POA: Diagnosis not present

## 2021-12-26 ENCOUNTER — Encounter
Admission: RE | Admit: 2021-12-26 | Discharge: 2021-12-26 | Disposition: A | Discharge status: Home / Self Care | Payer: 59 | Source: Ambulatory Visit | Attending: Physician Assistant | Admitting: Physician Assistant

## 2021-12-26 DIAGNOSIS — R0602 Shortness of breath: Secondary | ICD-10-CM | POA: Diagnosis not present

## 2021-12-26 DIAGNOSIS — R079 Chest pain, unspecified: Secondary | ICD-10-CM | POA: Diagnosis not present

## 2021-12-26 MED ORDER — REGADENOSON 0.4 MG/5ML IV SOLN
0.4000 mg | Freq: Once | INTRAVENOUS | Status: AC
  Administered 2021-12-26: 0.4 mg via INTRAVENOUS

## 2021-12-26 MED ORDER — TECHNETIUM TC 99M TETROFOSMIN IV KIT
30.0000 | PACK | Freq: Once | INTRAVENOUS | Status: AC | PRN
  Administered 2021-12-26: 30.17 via INTRAVENOUS

## 2021-12-27 ENCOUNTER — Encounter
Admission: RE | Admit: 2021-12-27 | Discharge: 2021-12-27 | Disposition: A | Discharge status: Home / Self Care | Payer: 59 | Source: Ambulatory Visit | Attending: Physician Assistant | Admitting: Physician Assistant

## 2021-12-27 MED ORDER — TECHNETIUM TC 99M TETROFOSMIN IV KIT
30.0000 | PACK | Freq: Once | INTRAVENOUS | Status: AC | PRN
  Administered 2021-12-27: 31.97 via INTRAVENOUS

## 2022-01-04 DIAGNOSIS — R0609 Other forms of dyspnea: Secondary | ICD-10-CM | POA: Diagnosis not present

## 2022-01-04 LAB — NM MYOCAR MULTI W/SPECT W/WALL MOTION / EF
Estimated workload: 1
Exercise duration (min): 1 min
Exercise duration (sec): 2 s
LV dias vol: 92 mL (ref 46–106)
LV sys vol: 47 mL
MPHR: 157 {beats}/min
Nuc Stress EF: 49 %
Peak HR: 122 {beats}/min
Percent HR: 100 %
Rest HR: 100 {beats}/min
Rest Nuclear Isotope Dose: 32 mCi
SDS: 1
SRS: 7
SSS: 2
ST Depression (mm): 0 mm
Stress Nuclear Isotope Dose: 30.2 mCi
TID: 0.84

## 2022-01-22 ENCOUNTER — Other Ambulatory Visit: Payer: Self-pay | Admitting: Psychiatry

## 2022-01-22 DIAGNOSIS — F411 Generalized anxiety disorder: Secondary | ICD-10-CM

## 2022-02-08 ENCOUNTER — Telehealth (INDEPENDENT_AMBULATORY_CARE_PROVIDER_SITE_OTHER): Payer: Self-pay | Admitting: Psychiatry

## 2022-02-08 DIAGNOSIS — F330 Major depressive disorder, recurrent, mild: Secondary | ICD-10-CM

## 2022-02-08 NOTE — Progress Notes (Signed)
Patient reports phone trouble at the time of appointment , unable to connect.Patient advised to reschedule appointment.

## 2022-03-19 ENCOUNTER — Telehealth (INDEPENDENT_AMBULATORY_CARE_PROVIDER_SITE_OTHER): Payer: 59 | Admitting: Psychiatry

## 2022-03-19 ENCOUNTER — Encounter: Payer: Self-pay | Admitting: Psychiatry

## 2022-03-19 DIAGNOSIS — G4701 Insomnia due to medical condition: Secondary | ICD-10-CM

## 2022-03-19 DIAGNOSIS — F4010 Social phobia, unspecified: Secondary | ICD-10-CM

## 2022-03-19 DIAGNOSIS — F411 Generalized anxiety disorder: Secondary | ICD-10-CM | POA: Diagnosis not present

## 2022-03-19 DIAGNOSIS — F3342 Major depressive disorder, recurrent, in full remission: Secondary | ICD-10-CM | POA: Diagnosis not present

## 2022-03-19 MED ORDER — DULOXETINE HCL 20 MG PO CPEP: 20.0000 mg | ORAL_CAPSULE | Freq: Every day | ORAL | 2 refills | Status: DC

## 2022-03-19 MED ORDER — BUPROPION HCL 75 MG PO TABS: ORAL_TABLET | ORAL | 2 refills | Status: DC

## 2022-03-19 MED ORDER — DULOXETINE HCL 60 MG PO CPEP: 60.0000 mg | ORAL_CAPSULE | Freq: Every day | ORAL | 2 refills | Status: DC

## 2022-03-19 NOTE — Progress Notes (Unsigned)
Virtual Visit via Telephone Note  I connected with Lorraine Hutchinson on 03/19/22 at  4:30 PM EST by telephone and verified that I am speaking with the correct person using two identifiers.  Location Provider Location : [facility] Patient Location : Home  Participants: Patient , Provider   I discussed the limitations, risks, security and privacy concerns of performing an evaluation and management service by telephone and the availability of in person appointments. I also discussed with the patient that there may be a patient responsible charge related to this service. The patient expressed understanding and agreed to proceed.    I discussed the assessment and treatment plan with the patient. The patient was provided an opportunity to ask questions and all were answered. The patient agreed with the plan and demonstrated an understanding of the instructions.   The patient was advised to call back or seek an in-person evaluation if the symptoms worsen or if the condition fails to improve as anticipated.   [place] MD OP Progress Note  03/20/2022 6:11 PM Lorraine Hutchinson  MRN:  [id number]  Chief Complaint:  Chief Complaint  Patient presents with   Follow-up   Anxiety   Insomnia   Medication Refill   HPI: Lorraine Hutchinson is a 62 year old Caucasian female, widowed, lives in [place], has a history of MDD, GAD, insomnia, OSA on CPAP, chronic pain, morbid obesity was evaluated by phone today.  Patient today reports she had car trouble and hence could not come into the office for a visit.  Hence this appointment was changed to a phone call today.  Patient reports overall she has been doing fairly well with regards to her mood on the higher dosage of Cymbalta.  Denies side effects.  Continues to have trouble with sleep.  She reports she does not have a good sleep hygiene.  She sleeps around 3 to 4 hours at night and then catches up on her sleep during the day.  She has been unable to work on her sleep  hygiene.  She does use a BiPAP at night and hence is worried about taking any sleep medication.  Patient reports otherwise she has been doing fairly well.  Denies any suicidality, homicidality or perceptual disturbances.  Patient appeared to be alert, oriented to person and place time and situation.  Three word memory immediate 3 out of 3, after 5 minutes and 3 out of 3.  Attention and focus seemed to be good, patient was able to spell the word ' WORLD' forward and backward.  Patient denies any other concerns today.  Visit Diagnosis:    ICD-10-CM   1. MDD (major depressive disorder), recurrent, in full remission ([facility])  F33.42 DULoxetine (CYMBALTA) 60 MG capsule    buPROPion (WELLBUTRIN) 75 MG tablet    2. Generalized anxiety disorder  F41.1 DULoxetine (CYMBALTA) 60 MG capsule    DULoxetine (CYMBALTA) 20 MG capsule    3. Insomnia due to medical condition  G47.01    OSA , pain    4. Social anxiety disorder  F40.10 DULoxetine (CYMBALTA) 60 MG capsule    DULoxetine (CYMBALTA) 20 MG capsule      Past Psychiatric History: Reviewed past psychiatric history from progress note on 04/16/2017.  Past trials of medications like Elavil, Seroquel, Pamelor, Ambien, trazodone, melatonin, Lunesta, Sonata, Remeron 0.  Past Medical History:  Past Medical History:  Diagnosis Date   Anxiety    Cancer ([place])    cervical   Depression    Diabetes mellitus  without complication ([place])    Hyperlipidemia    Hypertension    Scoliosis    Sleep apnea     Past Surgical History:  Procedure Laterality Date   ABDOMINAL HYSTERECTOMY     APPENDECTOMY     CESAREAN SECTION     REPLACEMENT TOTAL KNEE Right    TONSILLECTOMY      Family Psychiatric History: Reviewed family psychiatric history from progress note on 04/16/2017.  Family History:  Family History  Problem Relation Age of Onset   Diabetes Father    Diabetes Mother    Cancer Mother    Anxiety disorder Sister    Depression Sister     Social  History: Reviewed social history from progress note on 04/16/2017. Social History   Socioeconomic History   Marital status: Widowed    Spouse name: arthur   Number of children: 2   Years of education: Not on file   Highest education level: 11th grade  Occupational History   Not on file  Tobacco Use   Smoking status: Former    Packs/day: 1.00    Years: 40.00    Total pack years: 40.00    Types: Cigarettes    Quit date: 08/15/2017    Years since quitting: 4.5   Smokeless tobacco: Never  Vaping Use   Vaping Use: Never used  Substance and Sexual Activity   Alcohol use: No    Alcohol/week: 0.0 standard drinks of alcohol   Drug use: Yes    Types: Marijuana   Sexual activity: Not Currently  Other Topics Concern   Not on file  Social History Narrative   Not on file   Social Determinants of Health   Financial Resource Strain: Medium Risk (04/16/2017)   Overall Financial Resource Strain (CARDIA)    Difficulty of Paying Living Expenses: Somewhat hard  Food Insecurity: Food Insecurity Present (04/16/2017)   Hunger Vital Sign    Worried About Running Out of Food in the Last Year: Sometimes true    Ran Out of Food in the Last Year: Sometimes true  Transportation Needs: No Transportation Needs (04/16/2017)   PRAPARE - Hydrologist (Medical): No    Lack of Transportation (Non-Medical): No  Physical Activity: Inactive (04/16/2017)   Exercise Vital Sign    Days of Exercise per Week: 0 days    Minutes of Exercise per Session: 0 min  Stress: Stress Concern Present (04/16/2017)   [place]    Feeling of Stress : Very much  Social Connections: Somewhat Isolated (04/16/2017)   Social Connection and Isolation Panel [NHANES]    Frequency of Communication with Friends and Family: More than three times a week    Frequency of Social Gatherings with Friends and Family: Three times a week    Attends Religious  Services: Never    Active Member of Clubs or Organizations: No    Attends Archivist Meetings: Never    Marital Status: Married    Allergies:  Allergies  Allergen Reactions   Codeine Hives    Metabolic Disorder Labs: No results found for: "HGBA1C", "MPG" No results found for: "PROLACTIN" No results found for: "CHOL", "TRIG", "HDL", "CHOLHDL", "VLDL", "LDLCALC" No results found for: "TSH"  Therapeutic Level Labs: No results found for: "LITHIUM" No results found for: "VALPROATE" No results found for: "CBMZ"  Current Medications: Current Outpatient Medications  Medication Sig Dispense Refill   ACCU-CHEK GUIDE test strip USE (1 STRIP  TOTAL) 3 (THREE) TIMES DAILY USE AS INSTRUCTED.     albuterol (VENTOLIN HFA) 108 (90 Base) MCG/ACT inhaler Inhale into the lungs.     Alcohol Swabs (ALCOHOL WIPES) 70 % PADS Clean skin one to two times daily     B Complex Vitamins (VITAMIN B COMPLEX) TABS Take by mouth.     buPROPion (WELLBUTRIN) 75 MG tablet TAKE 1 TABLET BY MOUTH DAILY 30 tablet 2   Calcium-Vitamin D 600-200 MG-UNIT tablet Take 1 tablet by mouth 2 (two) times daily.     Cinnamon 500 MG capsule Take 2,000 mg by mouth daily.      clonazePAM (KLONOPIN) 0.5 MG tablet TAKE 1 TABLET BY MOUTH 2-3 TIMES A WEEK FOR SEVERE ANXIETY ATTACKS ONLY 12 tablet 1   Continuous Blood Gluc Receiver (FREESTYLE LIBRE 2 READER) DEVI Use to monitor blood glucose.     cyanocobalamin 1000 MCG tablet Take by mouth.     diltiazem (DILACOR XR) 180 MG 24 hr capsule Take 180 mg by mouth 2 (two) times daily.     diltiazem (TIAZAC) 180 MG 24 hr capsule Take by mouth.     DULoxetine (CYMBALTA) 20 MG capsule Take 1 capsule (20 mg total) by mouth daily. Take along with 60 mg daily 30 capsule 2   DULoxetine (CYMBALTA) 60 MG capsule Take 1 capsule (60 mg total) by mouth daily. 30 capsule 2   furosemide (LASIX) 20 MG tablet Take by mouth.     gabapentin (NEURONTIN) 400 MG capsule Take 1 capsule by mouth at  bedtime.     insulin aspart protamine - aspart (NOVOLOG 70/30 MIX) (70-30) 100 UNIT/ML FlexPen Inject 60 Units into the skin daily before breakfast. 62 units before supper     Insulin Pen Needle (B-D UF III MINI PEN NEEDLES) 31G X 5 MM MISC 2 (two) times daily E11.65     metFORMIN (GLUCOPHAGE-XR) 500 MG 24 hr tablet Take 1 tablet by mouth 2 (two) times daily.     Multiple Vitamins-Minerals (ABC PLUS SENIOR ADULTS 50+ PO) Take by mouth.     omeprazole (PRILOSEC) 20 MG capsule Take 20 mg by mouth daily.     Potassium 99 MG TABS Take 1 tablet by mouth daily.     pravastatin (PRAVACHOL) 40 MG tablet Take 40 mg by mouth daily.     torsemide (DEMADEX) 20 MG tablet Take by mouth. (Patient not taking: Reported on 12/11/2021)     TRELEGY ELLIPTA 100-62.5-25 MCG/INH AEPB Inhale 1 puff into the lungs daily.     TRULICITY 1.5 0000000 SOPN SMARTSIG:0.5 Milliliter(s) SUB-Q Once a Week     No current facility-administered medications for this visit.     Musculoskeletal: Strength & Muscle Tone:  UTA Gait & Station:  UTA Patient leans: N/A  Psychiatric Specialty Exam: Review of Systems  Psychiatric/Behavioral:  Positive for sleep disturbance.     There were no vitals taken for this visit.There is no height or weight on file to calculate BMI.  General Appearance:  UTA  Eye Contact:   UTA  Speech:  Clear and Coherent  Volume:  Normal  Mood:  Euthymic  Affect:   UTA  Thought Process:  Goal Directed and Descriptions of Associations: Intact  Orientation:  Full (Time, Place, and Person)  Thought Content: Logical   Suicidal Thoughts:  No  Homicidal Thoughts:  No  Memory:  Immediate;   Fair Recent;   Fair Remote;   Fair  Judgement:  Fair  Insight:  Fair  Psychomotor  Activity:   UTA  Concentration:  Concentration: Fair and Attention Span: Fair  Recall:  [employer] of Knowledge: Fair  Language: Fair  Akathisia:  No  Handed:  Right  AIMS (if indicated): not done  Assets:  Communication  Skills Desire for Improvement Housing Social Support  ADL's:  Intact  Cognition: WNL  Sleep:   Sleep hygiene problem   Screenings: [place] Office Visit from 10/24/2021 in [facility] from 09/05/2020 in [facility]  Total GAD-7 Score 8 20      PHQ2-9    [place] Office Visit from 10/24/2021 in [facility] Office Visit from 11/01/2020 in [facility] Video Visit from 09/05/2020 in [facility] Video Visit from 08/01/2020 in [facility] Video Visit from 04/19/2020 in [facility]  PHQ-2 Total Score 3 6 0 1 5  PHQ-9 Total Score 10 20 -- 7 11      Flowsheet Row Video Visit from 03/19/2022 in [facility] Video Visit from 12/11/2021 in [facility] Office Visit from 10/24/2021 in [facility] No Risk No Risk No Risk        Assessment and Plan: Lorraine Hutchinson is a 62 year old Caucasian female, widowed, lives [place], has a history of MDD, GAD, insomnia was evaluated by phone today.  Patient is currently stable with regards to her mood although continues to have sleep problems mostly due to lack of sleep hygiene.  Plan as noted below.  Plan MDD in remission Wellbutrin 75 mg p.o. daily Cymbalta 80 mg p.o. daily  GAD-improving Cymbalta 80 mg p.o. daily Klonopin 0.5 mg as needed for severe panic attacks. Reviewed [employer] PMP AWARxE  Insomnia-improving Patient advised to continue to work on sleep hygiene.  She gets a total of 6 to 7 hours of sleep per day however does not have a good sleep hygiene as noted above. Currently on BiPAP for  OSA. Patient will also need pain management.  Follow-up in clinic in 3 months or sooner in person.  Consent: Patient/Guardian gives verbal consent for treatment and assignment of benefits for services provided during this visit. Patient/Guardian expressed understanding and agreed to proceed.    I have spent at least 11 minutes non face to face with patient today.  This note was generated in part or whole with voice recognition software. Voice recognition is usually quite accurate but there are transcription errors that can and very often do occur. I apologize for any typographical errors that were not detected and corrected.     Ursula Alert, MD 03/20/2022, 6:11 PM

## 2022-03-21 ENCOUNTER — Other Ambulatory Visit: Payer: Self-pay | Admitting: Psychiatry

## 2022-03-21 DIAGNOSIS — F411 Generalized anxiety disorder: Secondary | ICD-10-CM

## 2022-05-21 ENCOUNTER — Other Ambulatory Visit: Payer: Self-pay | Admitting: Psychiatry

## 2022-05-21 DIAGNOSIS — F411 Generalized anxiety disorder: Secondary | ICD-10-CM

## 2022-06-19 ENCOUNTER — Other Ambulatory Visit: Payer: Self-pay | Admitting: Psychiatry

## 2022-06-19 DIAGNOSIS — F411 Generalized anxiety disorder: Secondary | ICD-10-CM

## 2022-06-19 DIAGNOSIS — F4010 Social phobia, unspecified: Secondary | ICD-10-CM

## 2022-06-21 ENCOUNTER — Ambulatory Visit: Payer: Medicaid Other | Admitting: Psychiatry

## 2022-06-21 ENCOUNTER — Encounter: Payer: Self-pay | Admitting: Psychiatry

## 2022-06-21 VITALS — BP 159/80 | HR 109 | Temp 98.1°F | Ht 69.0 in | Wt >= 6400 oz

## 2022-06-21 DIAGNOSIS — F3342 Major depressive disorder, recurrent, in full remission: Secondary | ICD-10-CM | POA: Diagnosis not present

## 2022-06-21 DIAGNOSIS — F4010 Social phobia, unspecified: Secondary | ICD-10-CM

## 2022-06-21 DIAGNOSIS — F411 Generalized anxiety disorder: Secondary | ICD-10-CM | POA: Diagnosis not present

## 2022-06-21 DIAGNOSIS — G4701 Insomnia due to medical condition: Secondary | ICD-10-CM | POA: Diagnosis not present

## 2022-06-21 MED ORDER — CLONAZEPAM 0.5 MG PO TABS: ORAL_TABLET | ORAL | 1 refills | Status: DC

## 2022-06-21 MED ORDER — DULOXETINE HCL 60 MG PO CPEP: 60.0000 mg | ORAL_CAPSULE | Freq: Every day | ORAL | 2 refills | Status: DC

## 2022-06-21 MED ORDER — DULOXETINE HCL 30 MG PO CPEP: 30.0000 mg | ORAL_CAPSULE | Freq: Every day | ORAL | 2 refills | Status: DC

## 2022-06-21 MED ORDER — BUPROPION HCL 75 MG PO TABS: ORAL_TABLET | ORAL | 2 refills | Status: DC

## 2022-06-21 NOTE — Progress Notes (Signed)
BH MD OP Progress Note  06/21/2022 5:07 PM Lorraine Hutchinson  MRN:  [id number]  Chief Complaint:  Chief Complaint  Patient presents with   Follow-up   Depression   Anxiety   HPI: Lorraine Hutchinson is a 62 year old Caucasian female, widowed, lives in [place] , has a history of MDD, GAD, insomnia, OSA on CPAP, chronic pain, morbid obesity was evaluated in office today.  Patient today reports she is currently struggling with anxiety symptoms mostly situational.  She reports she is a Product/process development scientist.  She does worry about things.  It has been difficult for her to get out and do things because of her physical limitations.  She is on 24/7 oxygen.  She also has shortness of breath when she walks.  That does make her anxiety worse.  She hence does not feel motivated to do much outside of her home.  She does visit her daughter.  She does watch TV.  She does enjoy that.  Patient reports sleep is restless although improving.  She continues to use BiPAP at night.  That does help.  Patient denies any suicidality, homicidality or perceptual disturbances.  Patient appeared to be alert, oriented to person place time situation.  3 word memory immediate 3 out of 3, after 5 minutes 2 out of 3.  She was able to spell the word 'WORLD' forward and backward.  Patient denied any other concerns today.    Visit Diagnosis:    ICD-10-CM   1. MDD (major depressive disorder), recurrent, in full remission (HCC)  F33.42 DULoxetine (CYMBALTA) 60 MG capsule    DULoxetine (CYMBALTA) 30 MG capsule    buPROPion (WELLBUTRIN) 75 MG tablet    2. Generalized anxiety disorder  F41.1 DULoxetine (CYMBALTA) 60 MG capsule    DULoxetine (CYMBALTA) 30 MG capsule    clonazePAM (KLONOPIN) 0.5 MG tablet    3. Insomnia due to medical condition  G47.01    OSA, pain    4. Social anxiety disorder  F40.10 DULoxetine (CYMBALTA) 60 MG capsule      Past Psychiatric History: Reviewed past psychiatric history from progress note on 04/16/2017.  Past  trials of medications like Elavil, Seroquel, Pamelor, Ambien, trazodone, melatonin, Lunesta, Sonata, mirtazapine.  Past Medical History:  Past Medical History:  Diagnosis Date   Anxiety    Cancer (HCC)    cervical   Depression    Diabetes mellitus without complication (HCC)    Hyperlipidemia    Hypertension    Scoliosis    Sleep apnea     Past Surgical History:  Procedure Laterality Date   ABDOMINAL HYSTERECTOMY     APPENDECTOMY     CESAREAN SECTION     REPLACEMENT TOTAL KNEE Right    TONSILLECTOMY      Family Psychiatric History: Reviewed family psychiatric history from progress note on 04/16/2017.  Family History:  Family History  Problem Relation Age of Onset   Diabetes Father    Diabetes Mother    Cancer Mother    Anxiety disorder Sister    Depression Sister     Social History: Reviewed social history from progress note on 04/16/2017. Social History   Socioeconomic History   Marital status: Widowed    Spouse name: arthur   Number of children: 2   Years of education: Not on file   Highest education level: 11th grade  Occupational History   Not on file  Tobacco Use   Smoking status: Former    Packs/day: 1.00  Years: 40.00    Additional pack years: 0.00    Total pack years: 40.00    Types: Cigarettes    Quit date: 08/15/2017    Years since quitting: 4.8   Smokeless tobacco: Never  Vaping Use   Vaping Use: Never used  Substance and Sexual Activity   Alcohol use: No    Alcohol/week: 0.0 standard drinks of alcohol   Drug use: Yes    Types: Marijuana   Sexual activity: Not Currently  Other Topics Concern   Not on file  Social History Narrative   Not on file   Social Determinants of Health   Financial Resource Strain: Medium Risk (04/16/2017)   Overall Financial Resource Strain (CARDIA)    Difficulty of Paying Living Expenses: Somewhat hard  Food Insecurity: Food Insecurity Present (04/16/2017)   Hunger Vital Sign    Worried About Running Out of Food  in the Last Year: Sometimes true    Ran Out of Food in the Last Year: Sometimes true  Transportation Needs: No Transportation Needs (04/16/2017)   PRAPARE - Administrator, Civil Service (Medical): No    Lack of Transportation (Non-Medical): No  Physical Activity: Inactive (04/16/2017)   Exercise Vital Sign    Days of Exercise per Week: 0 days    Minutes of Exercise per Session: 0 min  Stress: Stress Concern Present (04/16/2017)   [employer] of Occupational Health - Occupational Stress Questionnaire    Feeling of Stress : Very much  Social Connections: Somewhat Isolated (04/16/2017)   Social Connection and Isolation Panel [NHANES]    Frequency of Communication with Friends and Family: More than three times a week    Frequency of Social Gatherings with Friends and Family: Three times a week    Attends Religious Services: Never    Active Member of Clubs or Organizations: No    Attends Banker Meetings: Never    Marital Status: Married    Allergies:  Allergies  Allergen Reactions   Codeine Hives    Metabolic Disorder Labs: No results found for: "HGBA1C", "MPG" No results found for: "PROLACTIN" No results found for: "CHOL", "TRIG", "HDL", "CHOLHDL", "VLDL", "LDLCALC" No results found for: "TSH"  Therapeutic Level Labs: No results found for: "LITHIUM" No results found for: "VALPROATE" No results found for: "CBMZ"  Current Medications: Current Outpatient Medications  Medication Sig Dispense Refill   ACCU-CHEK GUIDE test strip USE (1 STRIP TOTAL) 3 (THREE) TIMES DAILY USE AS INSTRUCTED.     albuterol (VENTOLIN HFA) 108 (90 Base) MCG/ACT inhaler Inhale into the lungs.     Alcohol Swabs (ALCOHOL WIPES) 70 % PADS Clean skin one to two times daily     B Complex Vitamins (VITAMIN B COMPLEX) TABS Take by mouth.     Calcium-Vitamin D 600-200 MG-UNIT tablet Take 1 tablet by mouth 2 (two) times daily.     Cinnamon 500 MG capsule Take 2,000 mg by mouth daily.       Continuous Blood Gluc Receiver (FREESTYLE LIBRE 2 READER) DEVI Use to monitor blood glucose.     cyanocobalamin 1000 MCG tablet Take by mouth.     diltiazem (DILACOR XR) 180 MG 24 hr capsule Take 180 mg by mouth 2 (two) times daily.     diltiazem (TIAZAC) 180 MG 24 hr capsule Take by mouth.     DULoxetine (CYMBALTA) 30 MG capsule Take 1 capsule (30 mg total) by mouth daily. Take along with 60 mg daily 30 capsule 2  gabapentin (NEURONTIN) 400 MG capsule Take 1 capsule by mouth at bedtime.     insulin aspart protamine - aspart (NOVOLOG 70/30 MIX) (70-30) 100 UNIT/ML FlexPen Inject 60 Units into the skin daily before breakfast. 62 units before supper     Insulin Pen Needle (B-D UF III MINI PEN NEEDLES) 31G X 5 MM MISC 2 (two) times daily E11.65     metFORMIN (GLUCOPHAGE-XR) 500 MG 24 hr tablet Take 1 tablet by mouth 2 (two) times daily.     Multiple Vitamins-Minerals (ABC PLUS SENIOR ADULTS 50+ PO) Take by mouth.     omeprazole (PRILOSEC) 20 MG capsule Take 20 mg by mouth daily.     Potassium 99 MG TABS Take 1 tablet by mouth daily.     pravastatin (PRAVACHOL) 40 MG tablet Take 40 mg by mouth daily.     TRELEGY ELLIPTA 100-62.5-25 MCG/INH AEPB Inhale 1 puff into the lungs daily.     TRULICITY 1.5 MG/0.5ML SOPN SMARTSIG:0.5 Milliliter(s) SUB-Q Once a Week     buPROPion (WELLBUTRIN) 75 MG tablet TAKE 1 TABLET BY MOUTH DAILY 30 tablet 2   clonazePAM (KLONOPIN) 0.5 MG tablet TAKE 1 TABLET BY MOUTH 2 TIMES A WEEK FOR SEVERE ANXIETY ATTACKS ONLY 8 tablet 1   DULoxetine (CYMBALTA) 60 MG capsule Take 1 capsule (60 mg total) by mouth daily. Take along with 30 mg daily 30 capsule 2   furosemide (LASIX) 20 MG tablet Take by mouth.     torsemide (DEMADEX) 20 MG tablet Take by mouth. (Patient not taking: Reported on 12/11/2021)     No current facility-administered medications for this visit.     Musculoskeletal: Strength & Muscle Tone: within normal limits Gait & Station:  walks with cane Patient  leans: N/A  Psychiatric Specialty Exam: Review of Systems  Psychiatric/Behavioral:  Positive for sleep disturbance. The patient is nervous/anxious.     Blood pressure (!) 159/80, pulse (!) 109, temperature 98.1 F (36.7 C), temperature source Skin, height 5\' 9"  (1.753 m), weight (!) 421 lb 3.2 oz (191.1 kg), SpO2 98 %.Body mass index is 62.2 kg/m.  General Appearance: Casual  Eye Contact:  Fair  Speech:  Clear and Coherent  Volume:  Normal  Mood:  Anxious  Affect:  Congruent  Thought Process:  Goal Directed and Descriptions of Associations: Intact  Orientation:  Full (Time, Place, and Person)  Thought Content: Logical   Suicidal Thoughts:  No  Homicidal Thoughts:  No  Memory:  Immediate;   Fair Recent;   Fair Remote;   Fair  Judgement:  Fair  Insight:  Fair  Psychomotor Activity:  Normal  Concentration:  Concentration: Fair and Attention Span: Fair  Recall:  [employer] of Knowledge: Fair  Language: Fair  Akathisia:  No  Handed:  Right  AIMS (if indicated): not done  Assets:  Communication Skills Desire for Improvement Housing Social Support  ADL's:  Intact  Cognition: WNL  Sleep:   restless , overall better   Screenings: GAD-7    Flowsheet Row Office Visit from 06/21/2022 in [facility] Office Visit from 10/24/2021 in [facility] Counselor from 09/05/2020 in [facility]  Total GAD-7 Score 6 8 20       PHQ2-9    Flowsheet Row Office Visit from 06/21/2022 in [facility] Office Visit from 10/24/2021 in [facility] Office Visit from 11/01/2020 in [facility] Video Visit from 09/05/2020 in [facility] Video Visit from 08/01/2020 in [facility]  PHQ-2 Total  Score 0 3 6 0 1  PHQ-9 Total Score 3 10 20  -- 7      Flowsheet Row Office Visit from 06/21/2022 in [facility] Video Visit from 03/19/2022 in [facility] Video Visit from 12/11/2021 in [facility]  C-SSRS RISK CATEGORY No Risk No Risk No Risk        Assessment and Plan: NUBE VANOSTEN is a 62 year old Caucasian female, widowed, lives in [place], has a history of MDD, GAD, insomnia was evaluated in the office today.  Patient with anxiety symptoms, will benefit from the following plan.  Plan MDD in remission Wellbutrin 75 mg p.o. daily Cymbalta as prescribed  GAD-unstable Increase Cymbalta to 90 mg p.o. daily Klonopin 0.5 mg as needed for severe panic attacks currently provided short supply Reviewed [place] PMP AWARxE Patient not interested in psychotherapy.  Insomnia-improving Patient to continue BiPAP for OSA Patient to continue to work on sleep hygiene. Patient declined sleep medication since she is on BiPAP.   Follow-up in clinic in 1 to 2 months or sooner if needed.  Consent: Patient/Guardian gives verbal consent for treatment and assignment of benefits for services provided during this visit. Patient/Guardian expressed understanding and agreed to proceed.   This note was generated in part or whole with voice recognition software. Voice recognition is usually quite accurate but there are transcription errors that can and very often do occur. I apologize for any typographical errors that were not detected and corrected.      Jomarie Longs, MD 06/22/2022, 7:55 AM

## 2022-08-16 ENCOUNTER — Encounter: Payer: Self-pay | Admitting: Psychiatry

## 2022-08-16 ENCOUNTER — Telehealth (INDEPENDENT_AMBULATORY_CARE_PROVIDER_SITE_OTHER): Payer: Medicaid Other | Admitting: Psychiatry

## 2022-08-16 DIAGNOSIS — F331 Major depressive disorder, recurrent, moderate: Secondary | ICD-10-CM | POA: Diagnosis not present

## 2022-08-16 DIAGNOSIS — F411 Generalized anxiety disorder: Secondary | ICD-10-CM

## 2022-08-16 DIAGNOSIS — G4701 Insomnia due to medical condition: Secondary | ICD-10-CM | POA: Diagnosis not present

## 2022-08-16 DIAGNOSIS — F4010 Social phobia, unspecified: Secondary | ICD-10-CM

## 2022-08-16 MED ORDER — LAMOTRIGINE 25 MG PO TABS: 25.0000 mg | ORAL_TABLET | Freq: Every day | ORAL | 1 refills | Status: DC

## 2022-08-16 NOTE — Progress Notes (Signed)
Virtual Visit via Video Note  I connected with Lorraine Hutchinson on 08/16/22 at  4:30 PM EDT by a video enabled telemedicine application and verified that I am speaking with the correct person using two identifiers.  Location Provider Location : [facility] Patient Location : Home  Participants: Patient , Provider    I discussed the limitations of evaluation and management by telemedicine and the availability of in person appointments. The patient expressed understanding and agreed to proceed.   I discussed the assessment and treatment plan with the patient. The patient was provided an opportunity to ask questions and all were answered. The patient agreed with the plan and demonstrated an understanding of the instructions.   The patient was advised to call back or seek an in-person evaluation if the symptoms worsen or if the condition fails to improve as anticipated.  Video connection was lost at less than 50% of the duration of the visit, at which time the remainder of the visit was completed through audio only   [facility] MD OP Progress Note  08/16/2022 5:15 PM Lorraine Hutchinson  MRN:  [id number]  Chief Complaint:  Chief Complaint  Patient presents with   Follow-up   Anxiety   Depression   Medication Refill   HPI: Lorraine Hutchinson is a 62 year old Caucasian female, widowed, lives in [place], has a history of MDD, GAD, insomnia, OSA on CPAP, chronic pain, morbid obesity was evaluated by telemedicine today.  Patient today reports she is currently having crying spells, mood swings.  She has not noticed much difference from the Wellbutrin and the increase of the Cymbalta.  She denies side effects to her medications.  Patient patient reports she has not been able to find a therapist yet.  She reports previously when she tried therapy that did not help much.  That is why she is reluctant to find another therapist.  However patient agreeable to give it another chance.  Patient reports appetite is  fair.  Continues to have sleep restlessness although she is managing it better than before.  Patient denies any suicidality, homicidality or perceptual disturbances.  Patient denies any other concerns today.  Visit Diagnosis:    ICD-10-CM   1. MDD (major depressive disorder), recurrent episode, moderate (HCC)  F33.1 lamoTRIgine (LAMICTAL) 25 MG tablet    2. Generalized anxiety disorder  F41.1     3. Insomnia due to medical condition  G47.01    OSA,pain    4. Social anxiety disorder  F40.10       Past Psychiatric History: I have reviewed past psychiatric history from progress note on 04/16/2017.  Past trials of medications like Elavil, Seroquel, Pamelor, Ambien, trazodone, melatonin, Lunesta, Sonata, mirtazapine.  Past Medical History:  Past Medical History:  Diagnosis Date   Anxiety    Cancer (HCC)    cervical   Depression    Diabetes mellitus without complication (HCC)    Hyperlipidemia    Hypertension    Scoliosis    Sleep apnea     Past Surgical History:  Procedure Laterality Date   ABDOMINAL HYSTERECTOMY     APPENDECTOMY     CESAREAN SECTION     REPLACEMENT TOTAL KNEE Right    TONSILLECTOMY      Family Psychiatric History: Reviewed family psychiatric history from progress note on 04/16/2017.  Family History:  Family History  Problem Relation Age of Onset   Diabetes Father    Diabetes Mother    Cancer Mother    Anxiety disorder  Sister    Depression Sister     Social History: Reviewed social history from progress note on 04/16/2017. Social History   Socioeconomic History   Marital status: Widowed    Spouse name: arthur   Number of children: 2   Years of education: Not on file   Highest education level: 11th grade  Occupational History   Not on file  Tobacco Use   Smoking status: Former    Current packs/day: 0.00    Average packs/day: 1 pack/day for 40.0 years (40.0 ttl pk-yrs)    Types: Cigarettes    Start date: 08/15/1977    Quit date: 08/15/2017     Years since quitting: 5.0   Smokeless tobacco: Never  Vaping Use   Vaping status: Never Used  Substance and Sexual Activity   Alcohol use: No    Alcohol/week: 0.0 standard drinks of alcohol   Drug use: Yes    Types: Marijuana   Sexual activity: Not Currently  Other Topics Concern   Not on file  Social History Narrative   Not on file   Social Determinants of Health   Financial Resource Strain: Medium Risk (04/16/2017)   Overall Financial Resource Strain (CARDIA)    Difficulty of Paying Living Expenses: Somewhat hard  Food Insecurity: Food Insecurity Present (04/16/2017)   Hunger Vital Sign    Worried About Running Out of Food in the Last Year: Sometimes true    Ran Out of Food in the Last Year: Sometimes true  Transportation Needs: No Transportation Needs (04/16/2017)   PRAPARE - Administrator, Civil Service (Medical): No    Lack of Transportation (Non-Medical): No  Physical Activity: Inactive (04/16/2017)   Exercise Vital Sign    Days of Exercise per Week: 0 days    Minutes of Exercise per Session: 0 min  Stress: Stress Concern Present (04/16/2017)   [employer] of Occupational Health - Occupational Stress Questionnaire    Feeling of Stress : Very much  Social Connections: Somewhat Isolated (04/16/2017)   Social Connection and Isolation Panel [NHANES]    Frequency of Communication with Friends and Family: More than three times a week    Frequency of Social Gatherings with Friends and Family: Three times a week    Attends Religious Services: Never    Active Member of Clubs or Organizations: No    Attends Banker Meetings: Never    Marital Status: Married    Allergies:  Allergies  Allergen Reactions   Codeine Hives    Metabolic Disorder Labs: No results found for: "HGBA1C", "MPG" No results found for: "PROLACTIN" No results found for: "CHOL", "TRIG", "HDL", "CHOLHDL", "VLDL", "LDLCALC" No results found for: "TSH"  Therapeutic Level Labs: No  results found for: "LITHIUM" No results found for: "VALPROATE" No results found for: "CBMZ"  Current Medications: Current Outpatient Medications  Medication Sig Dispense Refill   JARDIANCE 25 MG TABS tablet Take 25 mg by mouth every morning.     lamoTRIgine (LAMICTAL) 25 MG tablet Take 1 tablet (25 mg total) by mouth daily. 30 tablet 1   ACCU-CHEK GUIDE test strip USE (1 STRIP TOTAL) 3 (THREE) TIMES DAILY USE AS INSTRUCTED.     albuterol (VENTOLIN HFA) 108 (90 Base) MCG/ACT inhaler Inhale into the lungs.     Alcohol Swabs (ALCOHOL WIPES) 70 % PADS Clean skin one to two times daily     B Complex Vitamins (VITAMIN B COMPLEX) TABS Take by mouth.     buPROPion (WELLBUTRIN) 75  MG tablet TAKE 1 TABLET BY MOUTH DAILY 30 tablet 2   Calcium-Vitamin D 600-200 MG-UNIT tablet Take 1 tablet by mouth 2 (two) times daily.     Cinnamon 500 MG capsule Take 2,000 mg by mouth daily.      clonazePAM (KLONOPIN) 0.5 MG tablet TAKE 1 TABLET BY MOUTH 2 TIMES A WEEK FOR SEVERE ANXIETY ATTACKS ONLY 8 tablet 1   Continuous Blood Gluc Receiver (FREESTYLE LIBRE 2 READER) DEVI Use to monitor blood glucose.     cyanocobalamin 1000 MCG tablet Take by mouth.     diltiazem (DILACOR XR) 180 MG 24 hr capsule Take 180 mg by mouth 2 (two) times daily.     diltiazem (TIAZAC) 180 MG 24 hr capsule Take by mouth.     DULoxetine (CYMBALTA) 30 MG capsule Take 1 capsule (30 mg total) by mouth daily. Take along with 60 mg daily 30 capsule 2   DULoxetine (CYMBALTA) 60 MG capsule Take 1 capsule (60 mg total) by mouth daily. Take along with 30 mg daily 30 capsule 2   furosemide (LASIX) 20 MG tablet Take by mouth.     gabapentin (NEURONTIN) 400 MG capsule Take 1 capsule by mouth at bedtime.     insulin aspart protamine - aspart (NOVOLOG 70/30 MIX) (70-30) 100 UNIT/ML FlexPen Inject 60 Units into the skin daily before breakfast. 62 units before supper     Insulin Pen Needle (B-D UF III MINI PEN NEEDLES) 31G X 5 MM MISC 2 (two) times daily  E11.65     metFORMIN (GLUCOPHAGE-XR) 500 MG 24 hr tablet Take 1 tablet by mouth 2 (two) times daily.     Multiple Vitamins-Minerals (ABC PLUS SENIOR ADULTS 50+ PO) Take by mouth.     omeprazole (PRILOSEC) 20 MG capsule Take 20 mg by mouth daily.     Potassium 99 MG TABS Take 1 tablet by mouth daily.     pravastatin (PRAVACHOL) 40 MG tablet Take 40 mg by mouth daily.     torsemide (DEMADEX) 20 MG tablet Take by mouth. (Patient not taking: Reported on 12/11/2021)     TRELEGY ELLIPTA 100-62.5-25 MCG/INH AEPB Inhale 1 puff into the lungs daily.     TRULICITY 1.5 MG/0.5ML SOPN SMARTSIG:0.5 Milliliter(s) SUB-Q Once a Week     No current facility-administered medications for this visit.     Musculoskeletal: Strength & Muscle Tone:  UTA Gait & Station:  UTA Patient leans: N/A  Psychiatric Specialty Exam: Review of Systems  Psychiatric/Behavioral:  Positive for sleep disturbance.        Mood swings, crying spells    There were no vitals taken for this visit.There is no height or weight on file to calculate BMI.  General Appearance:  UTA  Eye Contact:   UTA  Speech:  Clear and Coherent  Volume:  Normal  Mood:   Mood swings ,crying  Affect:   UTA  Thought Process:  Goal Directed and Descriptions of Associations: Intact  Orientation:  Full (Time, Place, and Person)  Thought Content: Logical   Suicidal Thoughts:  No  Homicidal Thoughts:  No  Memory:  Immediate;   Fair Recent;   Fair Remote;   Fair  Judgement:  Fair  Insight:  Fair  Psychomotor Activity:   UTA  Concentration:  Concentration: Fair and Attention Span: Fair  Recall:  [employer] of Knowledge: Fair  Language: Fair  Akathisia:  No  Handed:  Right  AIMS (if indicated): not done  Assets:  Communication Skills  Desire for Improvement Housing Social Support  ADL's:  Intact  Cognition: WNL  Sleep:   Restless at times , although overall OK   Screenings: GAD-7    Flowsheet Row Office Visit from 06/21/2022 in [facility] Office Visit from 10/24/2021 in [facility] Counselor from 09/05/2020 in [facility]  Total GAD-7 Score 6 8 20       PHQ2-9    Flowsheet Row Office Visit from 06/21/2022 in [facility] Office Visit from 10/24/2021 in [facility] Office Visit from 11/01/2020 in [facility] Video Visit from 09/05/2020 in [facility] Video Visit from 08/01/2020 in [facility]  PHQ-2 Total Score 0 3 6 0 1  PHQ-9 Total Score 3 10 20  -- 7      Flowsheet Row Video Visit from 08/16/2022 in [facility] Office Visit from 06/21/2022 in [facility] Video Visit from 03/19/2022 in [facility]  C-SSRS RISK CATEGORY No Risk No Risk No Risk        Assessment and Plan: Lorraine Hutchinson is a 62 year old Caucasian female, widowed, [place], has a history of MDD, GAD, insomnia was evaluated by telemedicine today.  Patient is currently struggling with mood swings, crying spells, will benefit from medication readjustment as well as encouraged to establish care with a therapist given her situational stressors, plan as noted below.  Plan MDD-unstable Wellbutrin 75 mg p.o. daily Cymbalta 90 mg p.o. daily Start Lamictal 25 mg p.o. daily in the morning Start CBT-provided resources encouraged to establish care with therapist.  GAD-some improvement Cymbalta 90 mg p.o. daily Klonopin 0.5 mg as needed for severe panic attacks only. Patient encouraged to establish care with a therapist and start CBT  Insomnia-improving Continue sleep hygiene techniques Continue BiPAP for  OSA  Follow-up in clinic in 1 month or sooner if needed.   Collaboration of Care: Collaboration of Care: Referral or follow-up with counselor/therapist AEB patient and patient to establish care with a therapist.  Patient/Guardian was advised Release of Information must be obtained prior to any record release in order to collaborate their care with an outside provider. Patient/Guardian was advised if they have not already done so to contact the registration department to sign all necessary forms in order for [place] to release information regarding their care.   Consent: Patient/Guardian gives verbal consent for treatment and assignment of benefits for services provided during this visit. Patient/Guardian expressed understanding and agreed to proceed.    I have spent at least 12 minutes non face to face with patient today.  This note was generated in part or whole with voice recognition software. Voice recognition is usually quite accurate but there are transcription errors that can and very often do occur. I apologize for any typographical errors that were not detected and corrected.     Jomarie Longs, MD 08/16/2022, 5:15 PM

## 2022-08-16 NOTE — Patient Instructions (Addendum)
  [email or web address]  www.[email or web address]  [employer] [email or web address] [address] [place], [place] [address]   [phone]  [facility] [email or web address] [address] [place], [place] [address]  [phone]   [facility] - [phone]  [facility] - [phone]  [facility] - [phone] counseling [phone]  [facility] [phone]   [email or web address] this clinician can offer telehealth and has a sliding scale option  [email or web address] this group also offers sliding scale rates and is based out of [place]   [employer] [facility] has interns who offer sliding scale rates and some of the full time clinicians do, as well. You complete their contact form on their website and the referrals coordinator will help to get connected to someone   Medicaid below :  [facility] [address] [place], [place] [address]  [phone]    [employer] [address] [place], [place] [address]  [phone]    [facility] [address] [place], [place] [address]  [phone]

## 2022-08-17 ENCOUNTER — Other Ambulatory Visit: Payer: Self-pay | Admitting: Psychiatry

## 2022-08-17 DIAGNOSIS — F411 Generalized anxiety disorder: Secondary | ICD-10-CM

## 2022-08-17 DIAGNOSIS — F3342 Major depressive disorder, recurrent, in full remission: Secondary | ICD-10-CM

## 2022-09-07 ENCOUNTER — Other Ambulatory Visit: Payer: Self-pay | Admitting: Psychiatry

## 2022-09-07 DIAGNOSIS — F331 Major depressive disorder, recurrent, moderate: Secondary | ICD-10-CM

## 2022-09-18 ENCOUNTER — Telehealth: Payer: Medicaid Other | Admitting: Psychiatry

## 2022-10-08 ENCOUNTER — Telehealth: Payer: Medicaid Other | Admitting: Psychiatry

## 2022-10-17 ENCOUNTER — Other Ambulatory Visit: Payer: Self-pay | Admitting: Psychiatry

## 2022-10-17 DIAGNOSIS — F3342 Major depressive disorder, recurrent, in full remission: Secondary | ICD-10-CM

## 2022-10-17 DIAGNOSIS — F411 Generalized anxiety disorder: Secondary | ICD-10-CM

## 2022-10-17 DIAGNOSIS — F4010 Social phobia, unspecified: Secondary | ICD-10-CM

## 2022-10-25 ENCOUNTER — Telehealth: Payer: Medicaid Other | Admitting: Psychiatry

## 2022-11-15 ENCOUNTER — Telehealth: Payer: Medicaid Other | Admitting: Psychiatry

## 2022-11-15 DIAGNOSIS — Z91199 Patient's noncompliance with other medical treatment and regimen due to unspecified reason: Secondary | ICD-10-CM

## 2022-11-15 NOTE — Progress Notes (Signed)
No response to call or text or video invite  

## 2022-11-27 ENCOUNTER — Telehealth: Payer: Medicaid Other | Admitting: Psychiatry

## 2022-11-27 ENCOUNTER — Encounter: Payer: Self-pay | Admitting: Psychiatry

## 2022-11-27 DIAGNOSIS — G4701 Insomnia due to medical condition: Secondary | ICD-10-CM

## 2022-11-27 DIAGNOSIS — F3342 Major depressive disorder, recurrent, in full remission: Secondary | ICD-10-CM

## 2022-11-27 DIAGNOSIS — F411 Generalized anxiety disorder: Secondary | ICD-10-CM

## 2022-11-27 DIAGNOSIS — F4010 Social phobia, unspecified: Secondary | ICD-10-CM

## 2022-11-27 DIAGNOSIS — G4733 Obstructive sleep apnea (adult) (pediatric): Secondary | ICD-10-CM | POA: Diagnosis not present

## 2022-11-27 MED ORDER — LAMOTRIGINE 25 MG PO TABS: 25.0000 mg | ORAL_TABLET | Freq: Every day | ORAL | 0 refills | Status: DC

## 2022-11-27 MED ORDER — CLONAZEPAM 0.5 MG PO TABS: ORAL_TABLET | ORAL | 3 refills | Status: DC

## 2022-11-27 NOTE — Progress Notes (Unsigned)
Virtual Visit via Video Note  I connected with Lorraine Hutchinson on 11/27/22 at  2:30 PM EST by a video enabled telemedicine application and verified that I am speaking with the correct person using two identifiers.  Location Provider Location : [facility] Patient Location : Home  Participants: Patient , Provider     I discussed the limitations of evaluation and management by telemedicine and the availability of in person appointments. The patient expressed understanding and agreed to proceed.  I discussed the assessment and treatment plan with the patient. The patient was provided an opportunity to ask questions and all were answered. The patient agreed with the plan and demonstrated an understanding of the instructions.   The patient was advised to call back or seek an in-person evaluation if the symptoms worsen or if the condition fails to improve as anticipated.   [facility] MD OP Progress Note  11/28/2022 5:02 PM Lorraine Hutchinson  MRN:  [id number]  Chief Complaint:  Chief Complaint  Patient presents with   Follow-up   Depression   Anxiety   Medication Refill   Insomnia   HPI: Lorraine Hutchinson is a 62 year old Caucasian female, widowed, lives in [place], has a history of MDD, GAD, insomnia, OSA on CPAP, chronic pain, morbid obesity was evaluated by telemedicine today.  Patient today reports she is currently doing fairly well with regards to her mood symptoms.  Denies any significant sadness, anxiety symptoms.  Denies any mood lability.  Patient reports she does not have a good sleep hygiene.  She is sleeping around 9 to 10 hours however she stays up most of the night and then sleeps through the day.  She usually wakes up in the afternoon and then stays up again until late night.  She reports she is not interested in correcting her sleep hygiene since it is a lot of work.  Patient reports good support system from her daughter.  Patient is compliant on her medications, Cymbalta, Lamictal.   Denies side effects.  Patient also uses clonazepam as needed.  That does help.  She denies any suicidality, homicidality or perceptual disturbances.  Patient denies any other concerns today.  Visit Diagnosis:    ICD-10-CM   1. MDD (major depressive disorder), recurrent, in full remission (HCC)  F33.42 lamoTRIgine (LAMICTAL) 25 MG tablet    2. Generalized anxiety disorder  F41.1 clonazePAM (KLONOPIN) 0.5 MG tablet    3. Insomnia due to medical condition  G47.01    OSA,pain    4. Social anxiety disorder  F40.10       Past Psychiatric History: I have reviewed past psychiatric history from progress note on 04/16/2017.  Past trials of medications like Elavil, Seroquel, Pamelor, Ambien, trazodone, melatonin, Lunesta, Sonata, mirtazapine.  Past Medical History:  Past Medical History:  Diagnosis Date   Anxiety    Cancer (HCC)    cervical   Depression    Diabetes mellitus without complication (HCC)    Hyperlipidemia    Hypertension    Scoliosis    Sleep apnea     Past Surgical History:  Procedure Laterality Date   ABDOMINAL HYSTERECTOMY     APPENDECTOMY     CESAREAN SECTION     REPLACEMENT TOTAL KNEE Right    TONSILLECTOMY      Family Psychiatric History: I have reviewed family psychiatric history from progress note on 04/16/2017.  Family History:  Family History  Problem Relation Age of Onset   Diabetes Father    Diabetes Mother  Cancer Mother    Anxiety disorder Sister    Depression Sister     Social History: I have reviewed social history from progress note on 04/16/2017. Social History   Socioeconomic History   Marital status: Widowed    Spouse name: arthur   Number of children: 2   Years of education: Not on file   Highest education level: 11th grade  Occupational History   Not on file  Tobacco Use   Smoking status: Former    Current packs/day: 0.00    Average packs/day: 1 pack/day for 40.0 years (40.0 ttl pk-yrs)    Types: Cigarettes    Start date:  08/15/1977    Quit date: 08/15/2017    Years since quitting: 5.2   Smokeless tobacco: Never  Vaping Use   Vaping status: Never Used  Substance and Sexual Activity   Alcohol use: No    Alcohol/week: 0.0 standard drinks of alcohol   Drug use: Yes    Types: Marijuana   Sexual activity: Not Currently  Other Topics Concern   Not on file  Social History Narrative   Not on file   Social Determinants of Health   Financial Resource Strain: Medium Risk (04/16/2017)   Overall Financial Resource Strain (CARDIA)    Difficulty of Paying Living Expenses: Somewhat hard  Food Insecurity: Food Insecurity Present (04/16/2017)   Hunger Vital Sign    Worried About Running Out of Food in the Last Year: Sometimes true    Ran Out of Food in the Last Year: Sometimes true  Transportation Needs: No Transportation Needs (04/16/2017)   PRAPARE - Administrator, Civil Service (Medical): No    Lack of Transportation (Non-Medical): No  Physical Activity: Inactive (04/16/2017)   Exercise Vital Sign    Days of Exercise per Week: 0 days    Minutes of Exercise per Session: 0 min  Stress: Stress Concern Present (04/16/2017)   [employer] of Occupational Health - Occupational Stress Questionnaire    Feeling of Stress : Very much  Social Connections: Somewhat Isolated (04/16/2017)   Social Connection and Isolation Panel [NHANES]    Frequency of Communication with Friends and Family: More than three times a week    Frequency of Social Gatherings with Friends and Family: Three times a week    Attends Religious Services: Never    Active Member of Clubs or Organizations: No    Attends Banker Meetings: Never    Marital Status: Married    Allergies:  Allergies  Allergen Reactions   Codeine Hives    Metabolic Disorder Labs: No results found for: "HGBA1C", "MPG" No results found for: "PROLACTIN" No results found for: "CHOL", "TRIG", "HDL", "CHOLHDL", "VLDL", "LDLCALC" No results found for:  "TSH"  Therapeutic Level Labs: No results found for: "LITHIUM" No results found for: "VALPROATE" No results found for: "CBMZ"  Current Medications: Current Outpatient Medications  Medication Sig Dispense Refill   ACCU-CHEK GUIDE test strip USE (1 STRIP TOTAL) 3 (THREE) TIMES DAILY USE AS INSTRUCTED.     albuterol (VENTOLIN HFA) 108 (90 Base) MCG/ACT inhaler Inhale into the lungs.     Alcohol Swabs (ALCOHOL WIPES) 70 % PADS Clean skin one to two times daily     B Complex Vitamins (VITAMIN B COMPLEX) TABS Take by mouth.     buPROPion (WELLBUTRIN) 75 MG tablet TAKE 1 TABLET BY MOUTH DAILY 30 tablet 5   Calcium-Vitamin D 600-200 MG-UNIT tablet Take 1 tablet by mouth 2 (two) times  daily.     Cinnamon 500 MG capsule Take 2,000 mg by mouth daily.      Continuous Blood Gluc Receiver (FREESTYLE LIBRE 2 READER) DEVI Use to monitor blood glucose.     cyanocobalamin 1000 MCG tablet Take by mouth.     diltiazem (DILACOR XR) 180 MG 24 hr capsule Take 180 mg by mouth 2 (two) times daily.     diltiazem (TIAZAC) 180 MG 24 hr capsule Take by mouth.     DULoxetine (CYMBALTA) 30 MG capsule TAKE 1 CAPSULE BY MOUTH DAILY ALONG WITH 60MG  DAILY 30 capsule 10   DULoxetine (CYMBALTA) 60 MG capsule TAKE 1 CAPSULE BY MOUTH DAILY ALONG WITH 30 MG DAILY 30 capsule 5   furosemide (LASIX) 20 MG tablet Take by mouth.     gabapentin (NEURONTIN) 400 MG capsule Take 1 capsule by mouth at bedtime.     insulin aspart protamine - aspart (NOVOLOG 70/30 MIX) (70-30) 100 UNIT/ML FlexPen Inject 60 Units into the skin daily before breakfast. 62 units before supper     Insulin Pen Needle (B-D UF III MINI PEN NEEDLES) 31G X 5 MM MISC 2 (two) times daily E11.65     JARDIANCE 25 MG TABS tablet Take 25 mg by mouth every morning.     metFORMIN (GLUCOPHAGE-XR) 500 MG 24 hr tablet Take 1 tablet by mouth 2 (two) times daily.     Multiple Vitamins-Minerals (ABC PLUS SENIOR ADULTS 50+ PO) Take by mouth.     omeprazole (PRILOSEC) 20 MG  capsule Take 20 mg by mouth daily.     Potassium 99 MG TABS Take 1 tablet by mouth daily.     pravastatin (PRAVACHOL) 40 MG tablet Take 40 mg by mouth daily.     TRELEGY ELLIPTA 100-62.5-25 MCG/INH AEPB Inhale 1 puff into the lungs daily.     TRULICITY 1.5 MG/0.5ML SOPN SMARTSIG:0.5 Milliliter(s) SUB-Q Once a Week     clonazePAM (KLONOPIN) 0.5 MG tablet TAKE 1 TABLET BY MOUTH TWICE A WEEK FOR SEVERE ANXIETY ATTACKS ONLY 8 tablet 3   lamoTRIgine (LAMICTAL) 25 MG tablet Take 1 tablet (25 mg total) by mouth daily. 90 tablet 0   torsemide (DEMADEX) 20 MG tablet Take by mouth. (Patient not taking: Reported on 12/11/2021)     No current facility-administered medications for this visit.     Musculoskeletal: Strength & Muscle Tone:  UTA Gait & Station:  Seated Patient leans: N/A  Psychiatric Specialty Exam: Review of Systems  Psychiatric/Behavioral:         Mood swings-improving    There were no vitals taken for this visit.There is no height or weight on file to calculate BMI.  General Appearance: Fairly Groomed  Eye Contact:  Fair  Speech:  Normal Rate  Volume:  Normal  Mood:   mood swings improved  Affect:  Congruent  Thought Process:  Goal Directed and Descriptions of Associations: Intact  Orientation:  Full (Time, Place, and Person)  Thought Content: Logical   Suicidal Thoughts:  No  Homicidal Thoughts:  No  Memory:  Immediate;   Fair Recent;   Fair Remote;   Fair  Judgement:  Fair  Insight:  Fair  Psychomotor Activity:  Normal  Concentration:  Concentration: Fair and Attention Span: Fair  Recall:  [employer] of Knowledge: Fair  Language: Fair  Akathisia:  No  Handed:  Right  AIMS (if indicated): not done  Assets:  Desire for Improvement Housing Social Support  ADL's:  Intact  Cognition: WNL  Sleep:   sleeps during the day ,but stays up most of the night , does not have a good sleep hygiene   Screenings: GAD-7    Flowsheet Row Office Visit from 06/21/2022 in [facility] Office Visit from 10/24/2021 in [facility] Counselor from 09/05/2020 in [facility]  Total GAD-7 Score 6 8 20       PHQ2-9    Flowsheet Row Video Visit from 11/27/2022 in [facility] Office Visit from 06/21/2022 in [facility] Office Visit from 10/24/2021 in [facility] Office Visit from 11/01/2020 in [facility] Video Visit from 09/05/2020 in [facility]  PHQ-2 Total Score 0 0 3 6 0  PHQ-9 Total Score -- 3 10 20  --      Flowsheet Row Video Visit from 11/27/2022 in [facility] Video Visit from 08/16/2022 in [facility] Office Visit from 06/21/2022 in [facility]  C-SSRS RISK CATEGORY No Risk No Risk No Risk        Assessment and Plan: Lorraine Hutchinson is a 62 year old Caucasian female, widowed, lives in [place], has a history of MDD, GAD, insomnia was evaluated by telemedicine today.  Patient is currently stable on the current medication regimen although currently use to struggle with sleep hygiene.  Plan as noted below.  Plan MDD in remission Wellbutrin 75 mg p.o. daily Cymbalta 90 mg p.o. daily Lamictal 25 mg p.o. daily in the morning Patient was referred for CBT-noncompliant.  GAD-stable Cymbalta 90 mg p.o. daily Klonopin 0.5 mg as needed for severe anxiety attacks only Reviewed [place] PMP AWARxE   Insomnia-improving Continue BiPAP for OSA Patient advised to work on sleep hygiene techniques.  Follow-up in clinic in 3 months or sooner in office.   Collaboration of Care: Collaboration of Care: Patient refused AEB patient was referred for  CBT-noncompliant.  Patient/Guardian was advised Release of Information must be obtained prior to any record release in order to collaborate their care with an outside provider. Patient/Guardian was advised if they have not already done so to contact the registration department to sign all necessary forms in order for [place] to release information regarding their care.   Consent: Patient/Guardian gives verbal consent for treatment and assignment of benefits for services provided during this visit. Patient/Guardian expressed understanding and agreed to proceed.   This note was generated in part or whole with voice recognition software. Voice recognition is usually quite accurate but there are transcription errors that can and very often do occur. I apologize for any typographical errors that were not detected and corrected.    Jomarie Longs, MD 11/28/2022, 5:02 PM

## 2022-12-09 ENCOUNTER — Other Ambulatory Visit: Payer: Self-pay | Admitting: Psychiatry

## 2022-12-09 DIAGNOSIS — F3342 Major depressive disorder, recurrent, in full remission: Secondary | ICD-10-CM

## 2023-01-17 ENCOUNTER — Other Ambulatory Visit: Payer: Self-pay | Admitting: Psychiatry

## 2023-01-17 DIAGNOSIS — F3342 Major depressive disorder, recurrent, in full remission: Secondary | ICD-10-CM

## 2023-01-19 ENCOUNTER — Other Ambulatory Visit: Payer: Self-pay | Admitting: Psychiatry

## 2023-01-19 DIAGNOSIS — F3342 Major depressive disorder, recurrent, in full remission: Secondary | ICD-10-CM

## 2023-01-20 ENCOUNTER — Other Ambulatory Visit: Payer: Self-pay | Admitting: Psychiatry

## 2023-01-20 DIAGNOSIS — F3342 Major depressive disorder, recurrent, in full remission: Secondary | ICD-10-CM

## 2023-02-15 ENCOUNTER — Other Ambulatory Visit: Payer: Self-pay | Admitting: Psychiatry

## 2023-02-15 DIAGNOSIS — F411 Generalized anxiety disorder: Secondary | ICD-10-CM

## 2023-02-20 ENCOUNTER — Encounter: Payer: Self-pay | Admitting: Psychiatry

## 2023-02-20 ENCOUNTER — Ambulatory Visit: Payer: Medicaid Other | Admitting: Psychiatry

## 2023-02-20 ENCOUNTER — Telehealth: Payer: Self-pay | Admitting: Psychiatry

## 2023-02-20 VITALS — BP 138/88 | HR 113 | Temp 98.3°F | Ht 69.0 in | Wt >= 6400 oz

## 2023-02-20 DIAGNOSIS — G4701 Insomnia due to medical condition: Secondary | ICD-10-CM

## 2023-02-20 DIAGNOSIS — F3342 Major depressive disorder, recurrent, in full remission: Secondary | ICD-10-CM | POA: Diagnosis not present

## 2023-02-20 DIAGNOSIS — F4010 Social phobia, unspecified: Secondary | ICD-10-CM | POA: Diagnosis not present

## 2023-02-20 DIAGNOSIS — F411 Generalized anxiety disorder: Secondary | ICD-10-CM

## 2023-02-20 MED ORDER — FLUOXETINE HCL 20 MG PO CAPS: 20.0000 mg | ORAL_CAPSULE | Freq: Every day | ORAL | 1 refills | Status: DC

## 2023-02-20 MED ORDER — DARIDOREXANT HCL 25 MG PO TABS: 25.0000 mg | ORAL_TABLET | Freq: Every day | ORAL | 0 refills | Status: DC

## 2023-02-20 NOTE — Progress Notes (Signed)
 [facility] MD OP Progress Note  02/20/2023 4:22 PM Lorraine Hutchinson  MRN:  [id number]  Chief Complaint:  Chief Complaint  Patient presents with   Follow-up   Anxiety   Depression   Insomnia   Medication Refill   HPI: Lorraine Hutchinson is a 63 year old Caucasian female, widowed, lives in [place], has a history of MDD, GAD, insomnia, OSA on CPAP, chronic pain, chronic kidney disease, morbid obesity was evaluated in office today.  She experiences persistent anxiety, particularly when preparing to go out or leave the house, feeling 'shaky' and noting that her anxiety medication did not alleviate these symptoms. This has led her to rely on her sister for transportation instead of driving herself. She also experiences shortness of breath easily, which she attributes to anxiety. No thoughts of self-harm or harm to others.  Regarding her insomnia, she reports significant sleep disturbances, having been awake for two days over the past weekend with only two hours of sleep, followed by sleeping all day on Monday. She attempts to sleep at night using BIPAP but finds the mask uncomfortable and feels as though she cannot breathe, despite being able to. She sometimes sleeps during the day, which affects her nighttime sleep. She has tried various sleep medications in the past, including trazodone , Seroquel , ramelteon , and zolpidem , but has not found them effective or tolerable.  She is currently taking several medications, including gabapentin, albuterol inhaler for COPD, Wellbutrin  (bupropion ) 75 mg, Cymbalta  (duloxetine ) 30 mg and 60 mg, Lamictal  (lamotrigine ) 25 mg.She has a history of trying Zoloft (sertraline) without success and has not tried Lexapro (escitalopram) or Prozac  (fluoxetine ).  She reports having chronic kidney disease and multiple other medical problems .   Visit Diagnosis:    ICD-10-CM   1. MDD (major depressive disorder), recurrent, in full remission (HCC)  F33.42     2. Generalized anxiety  disorder  F41.1 FLUoxetine  (PROZAC ) 20 MG capsule    3. Insomnia due to medical condition  G47.01 Daridorexant  HCl 25 MG TABS   Obstructive Apnea on CPAP, pain    4. Social anxiety disorder  F40.10 FLUoxetine  (PROZAC ) 20 MG capsule      Past Psychiatric History: I have reviewed past psychiatric history from progress note on 04/16/2017.  Past trials of medications like Elavil , Seroquel , Zoloft, Pamelor, Ambien , trazodone , melatonin, Lunesta , Sonata , mirtazapine  Past Medical History:  Past Medical History:  Diagnosis Date   Anxiety    Cancer (HCC)    cervical   Depression    Diabetes mellitus without complication (HCC)    Hyperlipidemia    Hypertension    Scoliosis    Sleep apnea     Past Surgical History:  Procedure Laterality Date   ABDOMINAL HYSTERECTOMY     APPENDECTOMY     CESAREAN SECTION     REPLACEMENT TOTAL KNEE Right    TONSILLECTOMY      Family Psychiatric History: I have reviewed family psychiatric history from progress note on 04/16/2017.  Family History:  Family History  Problem Relation Age of Onset   Diabetes Father    Diabetes Mother    Cancer Mother    Anxiety disorder Sister    Depression Sister     Social History: I have reviewed social history from progress note on 04/16/2017. Social History   Socioeconomic History   Marital status: Widowed    Spouse name: arthur   Number of children: 2   Years of education: Not on file   Highest education level: 11th grade  Occupational History   Not on file  Tobacco Use   Smoking status: Former    Current packs/day: 0.00    Average packs/day: 1 pack/day for 40.0 years (40.0 ttl pk-yrs)    Types: Cigarettes    Start date: 08/15/1977    Quit date: 08/15/2017    Years since quitting: 5.5   Smokeless tobacco: Never  Vaping Use   Vaping status: Never Used  Substance and Sexual Activity   Alcohol use: No    Alcohol/week: 0.0 standard drinks of alcohol   Drug use: Yes    Types: Marijuana   Sexual activity:  Not Currently  Other Topics Concern   Not on file  Social History Narrative   Not on file   Social Drivers of Health   Financial Resource Strain: Medium Risk (04/16/2017)   Overall Financial Resource Strain (CARDIA)    Difficulty of Paying Living Expenses: Somewhat hard  Food Insecurity: Food Insecurity Present (04/16/2017)   Hunger Vital Sign    Worried About Running Out of Food in the Last Year: Sometimes true    Ran Out of Food in the Last Year: Sometimes true  Transportation Needs: No Transportation Needs (04/16/2017)   PRAPARE - Administrator, Civil Service (Medical): No    Lack of Transportation (Non-Medical): No  Physical Activity: Inactive (04/16/2017)   Exercise Vital Sign    Days of Exercise per Week: 0 days    Minutes of Exercise per Session: 0 min  Stress: Stress Concern Present (04/16/2017)   [employer] of Occupational Health - Occupational Stress Questionnaire    Feeling of Stress : Very much  Social Connections: Somewhat Isolated (04/16/2017)   Social Connection and Isolation Panel [NHANES]    Frequency of Communication with Friends and Family: More than three times a week    Frequency of Social Gatherings with Friends and Family: Three times a week    Attends Religious Services: Never    Active Member of Clubs or Organizations: No    Attends Banker Meetings: Never    Marital Status: Married    Allergies:  Allergies  Allergen Reactions   Codeine Hives    Metabolic Disorder Labs: No results found for: HGBA1C, MPG No results found for: PROLACTIN No results found for: CHOL, TRIG, HDL, CHOLHDL, VLDL, LDLCALC No results found for: TSH  Therapeutic Level Labs: No results found for: LITHIUM No results found for: VALPROATE No results found for: CBMZ  Current Medications: Current Outpatient Medications  Medication Sig Dispense Refill   ACCU-CHEK GUIDE test strip USE (1 STRIP TOTAL) 3 (THREE) TIMES DAILY USE  AS INSTRUCTED.     albuterol (VENTOLIN HFA) 108 (90 Base) MCG/ACT inhaler Inhale into the lungs.     Alcohol Swabs (ALCOHOL WIPES) 70 % PADS Clean skin one to two times daily     B Complex Vitamins (VITAMIN B COMPLEX) TABS Take by mouth.     buPROPion  (WELLBUTRIN ) 75 MG tablet TAKE 1 TABLET BY MOUTH DAILY 30 tablet 5   Calcium-Vitamin D  600-200 MG-UNIT tablet Take 1 tablet by mouth 2 (two) times daily.     Cinnamon 500 MG capsule Take 2,000 mg by mouth daily.      clonazePAM  (KLONOPIN ) 0.5 MG tablet Take 1 tablet (0.5 mg total) by mouth as directed. TAKE 1 TABLET BY MOUTH TWICE A WEEK FOR SEVERE ANXIETY ATTACKS ONLY 8 tablet 2   Continuous Blood Gluc Receiver (FREESTYLE LIBRE 2 READER) DEVI Use to monitor blood glucose.  cyanocobalamin 1000 MCG tablet Take by mouth.     Daridorexant  HCl 25 MG TABS Take 1 tablet (25 mg total) by mouth at bedtime. For sleep 30 tablet 0   diltiazem (DILACOR XR) 180 MG 24 hr capsule Take 180 mg by mouth 2 (two) times daily.     diltiazem (TIAZAC) 180 MG 24 hr capsule Take by mouth.     DULoxetine  (CYMBALTA ) 30 MG capsule TAKE 1 CAPSULE BY MOUTH DAILY ALONG WITH 60MG  DAILY 30 capsule 10   FLUoxetine  (PROZAC ) 20 MG capsule Take 1 capsule (20 mg total) by mouth daily with breakfast. 30 capsule 1   gabapentin (NEURONTIN) 400 MG capsule Take 1 capsule by mouth at bedtime.     insulin aspart protamine - aspart (NOVOLOG 70/30 MIX) (70-30) 100 UNIT/ML FlexPen Inject 60 Units into the skin daily before breakfast. 62 units before supper     Insulin Pen Needle (B-D UF III MINI PEN NEEDLES) 31G X 5 MM MISC 2 (two) times daily E11.65     JARDIANCE 25 MG TABS tablet Take 25 mg by mouth every morning.     lamoTRIgine  (LAMICTAL ) 25 MG tablet TAKE 1 TABLET (25 MG TOTAL) BY MOUTH DAILY. 30 tablet 10   metFORMIN (GLUCOPHAGE-XR) 500 MG 24 hr tablet Take 1 tablet by mouth 2 (two) times daily.     Multiple Vitamins-Minerals (ABC PLUS SENIOR ADULTS 50+ PO) Take by mouth.      omeprazole (PRILOSEC) 20 MG capsule Take 20 mg by mouth daily.     Potassium 99 MG TABS Take 1 tablet by mouth daily.     pravastatin (PRAVACHOL) 40 MG tablet Take 40 mg by mouth daily.     TRELEGY ELLIPTA 100-62.5-25 MCG/INH AEPB Inhale 1 puff into the lungs daily.     TRULICITY 1.5 MG/0.5ML SOPN SMARTSIG:0.5 Milliliter(s) SUB-Q Once a Week     furosemide (LASIX) 20 MG tablet Take by mouth.     torsemide (DEMADEX) 20 MG tablet Take by mouth. (Patient not taking: Reported on 12/11/2021)     No current facility-administered medications for this visit.     Musculoskeletal: Strength & Muscle Tone: within normal limits Gait & Station:  walks with walker Patient leans: N/A  Psychiatric Specialty Exam: Review of Systems  Psychiatric/Behavioral:  Positive for sleep disturbance. The patient is nervous/anxious.     Blood pressure 138/88, pulse (!) 113, temperature 98.3 F (36.8 C), temperature source Temporal, height 5' 9 (1.753 m), weight (!) 400 lb (181.4 kg), SpO2 95%.Body mass index is 59.07 kg/m.  General Appearance: Casual  Eye Contact:  Fair  Speech:  Clear and Coherent  Volume:  Normal  Mood:  Anxious  Affect:  Appropriate  Thought Process:  Goal Directed and Descriptions of Associations: Intact  Orientation:  Full (Time, Place, and Person)  Thought Content: Logical   Suicidal Thoughts:  No  Homicidal Thoughts:  No  Memory:  Immediate;   Fair Recent;   Fair Remote;   Fair  Judgement:  Fair  Insight:  Fair  Psychomotor Activity:  Normal  Concentration:  Concentration: Fair and Attention Span: Fair  Recall:  [employer] of Knowledge: Fair  Language: Fair  Akathisia:  No  Handed:  Right  AIMS (if indicated): not done  Assets:  Desire for Improvement Housing Social Support Transportation  ADL's:  Intact  Cognition: WNL  Sleep:  Poor   Screenings: GAD-7    Loss Adjuster, Chartered Office Visit from 06/21/2022 in [facility]  Office Visit  from 10/24/2021 in [facility] Counselor from 09/05/2020 in [facility]  Total GAD-7 Score 6 8 20       PHQ2-9    Flowsheet Row Video Visit from 11/27/2022 in [facility] Office Visit from 06/21/2022 in [facility] Office Visit from 10/24/2021 in [facility] Office Visit from 11/01/2020 in [facility] Video Visit from 09/05/2020 in [facility]  PHQ-2 Total Score 0 0 3 6 0  PHQ-9 Total Score -- 3 10 20  --      Flowsheet Row Office Visit from 02/20/2023 in [facility] Video Visit from 11/27/2022 in [facility] Video Visit from 08/16/2022 in [facility]  C-SSRS RISK CATEGORY No Risk No Risk No Risk        Assessment and Plan: Lorraine Hutchinson is a 63 year old Caucasian female widowed, lives [place], has a history of MDD, GAD, insomnia currently struggling with anxiety as well as sleep problems, discussed assessment and plan as noted below.  Generalized Anxiety Disorder/Social Anxiety Disorder-unstable Chronic anxiety exacerbated by the need to go out, leading to physical symptoms such as shaking and shortness of breath. Current medication regimen includes duloxetine , which is not adequately controlling symptoms. Previous trials of sertraline were ineffective. Discussed switching to fluoxetine  due to its efficacy in treating anxiety. Informed about the need to reduce duloxetine  gradually to avoid withdrawal symptoms. Discussed potential benefits and side effects of fluoxetine , including nausea and insomnia. - Reduce Duloxetine  to 30 mg once daily, long-term plan to taper it off. - Start  Fluoxetine  20 mg daily - Recommend therapy or counseling for anxiety management, Patient declined  Insomnia-unstable Chronic difficulty sleeping, exacerbated by daytime napping and issues with CPAP mask. Previous trials of trazodone , quetiapine , ramelteon , and zolpidem  were either ineffective or had adverse effects. Discussed starting daridorexant  for sleep, noting its efficacy in improving sleep onset and maintenance. Informed about potential side effects, including headache and dizziness. Advised to avoid daytime napping or limit to 30 minutes before 2:30 PM to improve nighttime sleep. - Start Daridorexant  25 mg for sleep - Reviewed [place] PMP AWARxE - Advise against daytime napping or limit to 30 minutes before 2:30 PM - Continue BiPAP for obstructive sleep apnea  Major Depressive Disorder in remission Ongoing management with wellbutrin  and lamotrigine . Current symptoms include low mood and lack of motivation. - Continue Wellbutrin  75 mg daily - Continue Lamictal  25 mg daily in the morning.    Follow-up - Schedule follow-up appointment in 3-4 weeks via video.   Collaboration of Care: Collaboration of Care: Patient refused AEB patient refused a referral for therapy  Patient/Guardian was advised Release of Information must be obtained prior to any record release in order to collaborate their care with an outside provider. Patient/Guardian was advised if they have not already done so to contact the registration department to sign all necessary forms in order for us  to release information regarding their care.   Consent: Patient/Guardian gives verbal consent for treatment and assignment of benefits for services provided during this visit. Patient/Guardian expressed understanding and agreed to proceed.   This note was generated in part or whole with voice recognition software. Voice recognition is usually quite accurate but there are transcription errors that can and very often do occur. I  apologize for any typographical errors that were not detected and corrected.    Cody Albus, MD 02/22/2023, 8:19 AM

## 2023-02-20 NOTE — Telephone Encounter (Signed)
 Please contact [facility] and request to stop Duloxetine  60 mg . She is only on 30 mg now , being tapered off.

## 2023-02-20 NOTE — Patient Instructions (Signed)
 Fluoxetine  Capsules or Tablets (PMDD) What is this medication? FLUOXETINE  (floo OX e teen) treats premenstrual dysphoric disorder (PMDD). It increases the amount of serotonin in the brain, a hormone that helps regulate mood. It belongs to a group of medications called SSRIs. This medicine may be used for other purposes; ask your health care provider or pharmacist if you have questions. COMMON BRAND NAME(S): Prozac , Sarafem , Selfemra  What should I tell my care team before I take this medication? They need to know if you have any of these conditions: Bipolar disorder or a family history of bipolar disorder Bleeding disorder Glaucoma Heart disease Liver disease Low levels of sodium in the blood Seizures Suicidal thoughts, plans, or attempt by you or a family member Take an MAOI, such as Carbex, Eldepryl, Marplan, Nardil, or Parnate Take medications that treat or prevent blood clots Thyroid disease An unusual or allergic reaction to fluoxetine , other medications, foods, dyes, or preservatives Pregnant or trying to get pregnant Breastfeeding How should I use this medication? Take this medication by mouth with a glass of water. Follow the directions on the prescription label. You can take it with or without food. Take your medication at regular intervals. Do not take it more often than directed. Do not stop taking this medication suddenly except upon the advice of your care team. Stopping this medication too quickly may cause serious side effects or your condition may worsen. A special MedGuide will be given to you by the pharmacist with each prescription and refill. Be sure to read this information carefully each time. Talk to your care team about the use of this medication in children. Special care may be needed. Overdosage: If you think you have taken too much of this medicine contact a poison control center or emergency room at once. NOTE: This medicine is only for you. Do not share this  medicine with others. What if I miss a dose? If you miss a dose, skip the missed dose and go back to your regular dosing schedule. Do not take double or extra doses. What may interact with this medication? Do not take this medication with any of the following: Other medications containing fluoxetine , such as Prozac  or Symbyax Cisapride Dronedarone Linezolid MAOIs, such as Carbex, Eldepryl, Marplan, Nardil, and Parnate Methylene blue (injected into a vein) Pimozide Thioridazine This medication may also interact with the following: Alcohol Amphetamines Aspirin and aspirin-like medications Carbamazepine Certain medications for mental health conditions Certain medications for migraine headache, such as almotriptan, eletriptan, frovatriptan, naratriptan, rizatriptan, sumatriptan, zolmitriptan Digoxin Diuretics Fentanyl  Flecainide Furazolidone Isoniazid Lithium Medications that help you fall asleep Medications that treat or prevent blood clots, such as warfarin, enoxaparin, and dalteparin NSAIDs, medications for pain and inflammation, such as ibuprofen or naproxen Other medications that cause heart rhythm changes Phenytoin Procarbazine Propafenone Rasagiline Ritonavir Supplements, such as St. John's wort, kava kava, valerian Tramadol Tryptophan Vinblastine This list may not describe all possible interactions. Give your health care provider a list of all the medicines, herbs, non-prescription drugs, or dietary supplements you use. Also tell them if you smoke, drink alcohol, or use illegal drugs. Some items may interact with your medicine. What should I watch for while using this medication? Tell your care team if your symptoms do not get better or if they get worse. Visit your care team for regular checks on your progress. Because it may take several weeks to see the full effects of this medication, it is important to continue your treatment as prescribed. Watch for new  or  worsening thoughts of suicide or depression. This includes sudden changes in mood, behavior, or thoughts. These changes can happen at any time but are more common in the beginning of treatment or after a change in dose. Call your care team right away if you experience these thoughts or worsening depression. This medication may cause mood and behavior changes, such as anxiety, nervousness, irritability, hostility, restlessness, excitability, hyperactivity, or trouble sleeping. These changes can happen at any time but are more common in the beginning of treatment or after a change in dose. Call your care team right away if you notice any of these symptoms. This medication may affect your coordination, reaction time, or judgment. Do not drive or operate machinery until you know how this medication affects you. Sit up or stand slowly to reduce the risk of dizzy or fainting spells. Drinking alcohol with this medication can increase the risk of these side effects. Your mouth may get dry. Chewing sugarless gum or sucking hard candy and drinking plenty of water may help. Contact your care team if the problem does not go away or is severe. This medication may affect blood sugar levels. If you have diabetes, check with your care team before you make changes to your diet or medications. What side effects may I notice from receiving this medication? Side effects that you should report to your care team as soon as possible: Allergic reactions--skin rash, itching, hives, swelling of the face, lips, tongue, or throat Bleeding--bloody or black, tar-like stools, red or dark brown urine, vomiting blood or brown material that looks like coffee grounds, small, red or purple spots on skin, unusual bleeding or bruising Heart rhythm changes--fast or irregular heartbeat, dizziness, feeling faint or lightheaded, chest pain, trouble breathing Loss of appetite with weight loss Low sodium level--muscle weakness, fatigue, dizziness,  headache, confusion Serotonin syndrome--irritability, confusion, fast or irregular heartbeat, muscle stiffness, twitching muscles, sweating, high fever, seizure, chills, vomiting, diarrhea Sudden eye pain or change in vision such as blurry vision, seeing halos around lights, vision loss Thoughts of suicide or self-harm, worsening mood, feelings of depression Side effects that usually do not require medical attention (report to your care team if they continue or are bothersome): Anxiety, nervousness Change in sex drive or performance Diarrhea Dry mouth Headache Excessive sweating Nausea Tremors or shaking Trouble sleeping Upset stomach This list may not describe all possible side effects. Call your doctor for medical advice about side effects. You may report side effects to FDA at 1-800-FDA-1088. Where should I keep my medication? Keep out of the reach of children and pets. Store at room temperature between 15 and 30 degrees C (59 and 86 degrees F). Get rid of any unused medication after the expiration date. NOTE: This sheet is a summary. It may not cover all possible information. If you have questions about this medicine, talk to your doctor, pharmacist, or health care provider. Daridorexant  Tablets What is this medication? DARIDOREXANT  (DAR i doe REX ant) treats insomnia. It helps you go to sleep faster and stay asleep through the night. This medicine may be used for other purposes; ask your health care provider or pharmacist if you have questions. COMMON BRAND NAME(S): QUVIVIQ  What should I tell my care team before I take this medication? They need to know if you have any of these conditions: Depression Frequently drink alcohol History of a sudden onset of muscle weakness (cataplexy) History of falling asleep often at unexpected times (narcolepsy) History of substance use disorder Liver disease  Lung or breathing disease Sleep apnea Sleep-walking, driving, eating or other activity  while not fully awake after taking a sleep medication Suicidal thoughts, plans, or attempt by you or a family member An unusual or allergic reaction to daridorexant , other medications, foods, dyes, or preservatives Pregnant or trying to get pregnant Breast-feeding How should I use this medication? Take this medication by mouth with water 30 minutes before going to bed. Take it as directed on the prescription label. It is better to take this medication on an empty stomach. A special MedGuide will be given to you by the pharmacist with each prescription and refill. Be sure to read this information carefully each time. Talk to your care team about the use of this medication in children. Special care may be needed. Overdosage: If you think you have taken too much of this medicine contact a poison control center or emergency room at once. NOTE: This medicine is only for you. Do not share this medicine with others. What if I miss a dose? This does not apply. This medication should only be taken as directed before going to sleep. Do not take double or extra doses. What may interact with this medication? Alcohol Benzodiazepines, such as alprazolam, diazepam , or lorazepam Bosentan Certain antibiotics, such as erythromycin or clarithromycin Certain antihistamines Certain antivirals for HIV or hepatitis Certain medications for depression, anxiety, or mental health conditions Certain medications for fungal infections, such as itraconazole, ketoconazole, posaconazole, voriconazole Certain medications for seizures, such as carbamazepine, phenytoin, phenobarbital Diltiazem Grapefruit juice Medications that cause drowsiness before a procedure, such as propofol Medications that relax muscles Opioids for pain or cough Other medications that help you fall asleep Phenothiazines, such as chlorpromazine, prochlorperazine, thioridazine Rifampin St. John's wort Verapamil This list may not describe all  possible interactions. Give your health care provider a list of all the medicines, herbs, non-prescription drugs, or dietary supplements you use. Also tell them if you smoke, drink alcohol, or use illegal drugs. Some items may interact with your medicine. What should I watch for while using this medication? Visit your care team for regular checks on your progress. Keep a regular sleep schedule by going to bed at about the same time each night. Avoid caffeine-containing drinks in the evening hours. Talk to your care team if your insomnia worsens or is not better within 7 to 10 days. You may do unusual sleep behaviors or activities you do not remember the day after taking this medication. Activities include driving, making or eating food, talking on the phone, sexual activity, or sleep walking. Stop taking this medication and call your care team right away if you find out you have done activities like this. Plan to go to bed and stay in bed for a full night (7 to 8 hours) after you take this medication. You may still be drowsy the morning after taking this medication. This medication may affect your coordination, reaction time, or judgment. Do not drive or operate machinery until you know how this medication affects you. Sit up or stand slowly to reduce the risk of dizzy or fainting spells. Watch for new or worsening thoughts of suicide or depression. This includes sudden changes in mood, behaviors, or thoughts. These changes can happen at any time but are more common in the beginning of treatment or after a change in dose. Call your care team right away if you experience these thoughts or worsening depression. After you stop taking this medication, you may have trouble falling asleep. This is  called rebound insomnia. This problem usually goes away on its own after 1 or 2 nights. What side effects may I notice from receiving this medication? Side effects that you should report to your care team as soon as  possible: Allergic reactions or angioedema--skin rash, itching or hives, swelling of the face, eyes, lips, tongue, arms, or legs, trouble swallowing or breathing CNS depression--slow or shallow breathing, shortness of breath, feeling faint, dizziness, confusion, trouble staying awake Mood and behavior changes--anxiety, nervousness, confusion, hallucinations, irritability, hostility, thoughts of suicide or self-harm, worsening mood, feelings of depression Sudden and temporary muscle weakness Unable to move or speak for several minutes upon waking or going to sleep Unusual sleep behaviors or activities you do not remember such as driving, eating, or sexual activity Side effects that usually do not require medical attention (report these to your care team if they continue or are bothersome): Drowsiness the day after use Fatigue Headache This list may not describe all possible side effects. Call your doctor for medical advice about side effects. You may report side effects to FDA at 1-800-FDA-1088. Where should I keep my medication? Keep out of the reach of children and pets. This medication can be abused. Keep it in a safe place to protect it from theft. Do not share it with anyone. It is only for you. Selling or giving away this medication is dangerous and against the law. Store between 20 and 25 degrees C (68 and 77 degrees F). Get rid of any unused medication after the expiration date. This medication may cause harm and death if it is taken by other adults, children, or pets. It is important to get rid of the medication as soon as you no longer need it or it is expired. You can do this in two ways: Take the medication to a medication take-back program. Check with your pharmacy or law enforcement to find a location. If you cannot return the medication, check the label or package insert to see if the medication should be thrown out in the garbage or flushed down the toilet. If you are not sure, ask  your care team. If it is safe to put it in the trash, take the medication out of the container. Mix the medication with cat litter, dirt, coffee grounds, or other unwanted substance. Seal the mixture in a bag or container. Put it in the trash. NOTE: This sheet is a summary. It may not cover all possible information. If you have questions about this medicine, talk to your doctor, pharmacist, or health care provider.  2024 [employer] Standard (2022-12-14 00:00:00)    2024 [employer] Standard (2021-10-17 00:00:00)

## 2023-02-21 NOTE — Telephone Encounter (Signed)
 Pharmacy notified.

## 2023-02-25 ENCOUNTER — Telehealth: Payer: Self-pay

## 2023-02-25 NOTE — Telephone Encounter (Signed)
 Initiated prior authorization for patients Quviviq  25 mg tablets 30 tablets for 30 days  PA case #[id number] Coverage [id number]-------08-24-23  Called to make patient aware of approval no answer left voicemail

## 2023-03-28 ENCOUNTER — Other Ambulatory Visit: Payer: Self-pay | Admitting: Psychiatry

## 2023-03-28 DIAGNOSIS — F3342 Major depressive disorder, recurrent, in full remission: Secondary | ICD-10-CM

## 2023-03-30 ENCOUNTER — Other Ambulatory Visit: Payer: Self-pay | Admitting: Psychiatry

## 2023-03-30 DIAGNOSIS — F4010 Social phobia, unspecified: Secondary | ICD-10-CM

## 2023-03-30 DIAGNOSIS — G4701 Insomnia due to medical condition: Secondary | ICD-10-CM

## 2023-03-30 DIAGNOSIS — F411 Generalized anxiety disorder: Secondary | ICD-10-CM

## 2023-04-01 ENCOUNTER — Other Ambulatory Visit: Payer: Self-pay | Admitting: Psychiatry

## 2023-04-01 DIAGNOSIS — F411 Generalized anxiety disorder: Secondary | ICD-10-CM

## 2023-04-01 DIAGNOSIS — G4701 Insomnia due to medical condition: Secondary | ICD-10-CM

## 2023-04-01 DIAGNOSIS — F4010 Social phobia, unspecified: Secondary | ICD-10-CM

## 2023-04-04 ENCOUNTER — Encounter: Payer: Self-pay | Admitting: Psychiatry

## 2023-04-04 ENCOUNTER — Telehealth: Payer: Medicaid Other | Admitting: Psychiatry

## 2023-04-04 DIAGNOSIS — F3342 Major depressive disorder, recurrent, in full remission: Secondary | ICD-10-CM | POA: Diagnosis not present

## 2023-04-04 DIAGNOSIS — G4701 Insomnia due to medical condition: Secondary | ICD-10-CM

## 2023-04-04 DIAGNOSIS — F4010 Social phobia, unspecified: Secondary | ICD-10-CM | POA: Diagnosis not present

## 2023-04-04 DIAGNOSIS — F411 Generalized anxiety disorder: Secondary | ICD-10-CM | POA: Diagnosis not present

## 2023-04-04 MED ORDER — DULOXETINE HCL 30 MG PO CPEP: 30.0000 mg | ORAL_CAPSULE | Freq: Every day | ORAL | 0 refills | Status: DC

## 2023-04-04 MED ORDER — BUPROPION HCL 75 MG PO TABS: ORAL_TABLET | ORAL | 5 refills | Status: DC

## 2023-04-04 NOTE — Progress Notes (Unsigned)
 Virtual Visit via Video Note  I connected with Lorraine Hutchinson on 04/04/23 at  3:00 PM EDT by a video enabled telemedicine application and verified that I am speaking with the correct person using two identifiers.  Location Provider Location : [facility] Patient Location : Home  Participants: Patient , Provider    I discussed the limitations of evaluation and management by telemedicine and the availability of in person appointments. The patient expressed understanding and agreed to proceed.    I discussed the assessment and treatment plan with the patient. The patient was provided an opportunity to ask questions and all were answered. The patient agreed with the plan and demonstrated an understanding of the instructions.   The patient was advised to call back or seek an in-person evaluation if the symptoms worsen or if the condition fails to improve as anticipated.   BH MD OP Progress Note  04/05/2023 7:20 AM Lorraine Hutchinson  MRN:  [id number]  Chief Complaint:  Chief Complaint  Patient presents with   Follow-up   Anxiety   Depression   Medication Refill    HPI: Lorraine Hutchinson is a 63 year old Caucasian female, widowed, lives in [place] has a history of MDD, GAD, insomnia, OSA , COPD , chronic pain, chronic kidney disease, morbid obesity was evaluated by telemedicine today.  She experiences anxiety symptoms primarily when preparing to go out, describing feelings of being 'real nervous' and unable to 'function good.' These episodes are accompanied by shaking. Despite previous discussions, she has not started therapy.  She is currently taking fluoxetine and duloxetine. She has been taking 60 mg of duloxetine for about two weeks due to running out of the 30 mg dose and is awaiting a new prescription to taper down. She is also on bupropion as part of her antidepressant regimen.  She reports improvement in sleep since starting a new sleep medication, daridorexant achieving about  four hours of sleep per night.  Denies suicidal thoughts.  She continues to need a Bipap night however finds the mask is uncomfortable which could also be affecting her sleep.  She denies any homicidality or perceptual disturbances.  She appeared to be alert, oriented and was able to answer questions well during the session.    Visit Diagnosis:    ICD-10-CM   1. MDD (major depressive disorder), recurrent, in full remission (HCC)  F33.42 buPROPion (WELLBUTRIN) 75 MG tablet    2. Generalized anxiety disorder  F41.1 DULoxetine (CYMBALTA) 30 MG capsule    3. Insomnia due to medical condition  G47.01    Obstructive sleep apnea, COPD on BiPAP, pain    4. Social anxiety disorder  F40.10       Past Psychiatric History: I have reviewed past psychiatric history from progress note on 04/16/2017.  Past trials of medications like Elavil, Seroquel, Zoloft, Pamelor, Ambien, trazodone, Cymbalta, melatonin, Lunesta, Sonata, mirtazapine.  Past Medical History:  Past Medical History:  Diagnosis Date   Anxiety    Cancer (HCC)    cervical   Depression    Diabetes mellitus without complication (HCC)    Hyperlipidemia    Hypertension    Scoliosis    Sleep apnea     Past Surgical History:  Procedure Laterality Date   ABDOMINAL HYSTERECTOMY     APPENDECTOMY     CESAREAN SECTION     REPLACEMENT TOTAL KNEE Right    TONSILLECTOMY      Family Psychiatric History: Reviewed family psychiatric history from progress note on 04/16/2017.  Family History:  Family History  Problem Relation Age of Onset   Diabetes Father    Diabetes Mother    Cancer Mother    Anxiety disorder Sister    Depression Sister     Social History: Reviewed social history from progress note on 04/16/2017. Social History   Socioeconomic History   Marital status: Widowed    Spouse name: arthur   Number of children: 2   Years of education: Not on file   Highest education level: 11th grade  Occupational History   Not on  file  Tobacco Use   Smoking status: Former    Current packs/day: 0.00    Average packs/day: 1 pack/day for 40.0 years (40.0 ttl pk-yrs)    Types: Cigarettes    Start date: 08/15/1977    Quit date: 08/15/2017    Years since quitting: 5.6   Smokeless tobacco: Never  Vaping Use   Vaping status: Never Used  Substance and Sexual Activity   Alcohol use: No    Alcohol/week: 0.0 standard drinks of alcohol   Drug use: Yes    Types: Marijuana   Sexual activity: Not Currently  Other Topics Concern   Not on file  Social History Narrative   Not on file   Social Drivers of Health   Financial Resource Strain: Medium Risk (04/16/2017)   Overall Financial Resource Strain (CARDIA)    Difficulty of Paying Living Expenses: Somewhat hard  Food Insecurity: Food Insecurity Present (04/16/2017)   Hunger Vital Sign    Worried About Running Out of Food in the Last Year: Sometimes true    Ran Out of Food in the Last Year: Sometimes true  Transportation Needs: No Transportation Needs (04/16/2017)   PRAPARE - Administrator, Civil Service (Medical): No    Lack of Transportation (Non-Medical): No  Physical Activity: Inactive (04/16/2017)   Exercise Vital Sign    Days of Exercise per Week: 0 days    Minutes of Exercise per Session: 0 min  Stress: Stress Concern Present (04/16/2017)   [employer] of Occupational Health - Occupational Stress Questionnaire    Feeling of Stress : Very much  Social Connections: Somewhat Isolated (04/16/2017)   Social Connection and Isolation Panel [NHANES]    Frequency of Communication with Friends and Family: More than three times a week    Frequency of Social Gatherings with Friends and Family: Three times a week    Attends Religious Services: Never    Active Member of Clubs or Organizations: No    Attends Banker Meetings: Never    Marital Status: Married    Allergies:  Allergies  Allergen Reactions   Codeine Hives    Metabolic Disorder  Labs: No results found for: "HGBA1C", "MPG" No results found for: "PROLACTIN" No results found for: "CHOL", "TRIG", "HDL", "CHOLHDL", "VLDL", "LDLCALC" No results found for: "TSH"  Therapeutic Level Labs: No results found for: "LITHIUM" No results found for: "VALPROATE" No results found for: "CBMZ"  Current Medications: Current Outpatient Medications  Medication Sig Dispense Refill   DULoxetine (CYMBALTA) 30 MG capsule Take 1 capsule (30 mg total) by mouth daily for 21 days. Stop after 3 weeks 21 capsule 0   ACCU-CHEK GUIDE test strip USE (1 STRIP TOTAL) 3 (THREE) TIMES DAILY USE AS INSTRUCTED.     albuterol (VENTOLIN HFA) 108 (90 Base) MCG/ACT inhaler Inhale into the lungs.     Alcohol Swabs (ALCOHOL WIPES) 70 % PADS Clean skin one to two times daily  B Complex Vitamins (VITAMIN B COMPLEX) TABS Take by mouth.     buPROPion (WELLBUTRIN) 75 MG tablet TAKE 1 TABLET BY MOUTH DAILY 30 tablet 5   Calcium-Vitamin D 600-200 MG-UNIT tablet Take 1 tablet by mouth 2 (two) times daily.     Cinnamon 500 MG capsule Take 2,000 mg by mouth daily.      clonazePAM (KLONOPIN) 0.5 MG tablet Take 1 tablet (0.5 mg total) by mouth as directed. TAKE 1 TABLET BY MOUTH TWICE A WEEK FOR SEVERE ANXIETY ATTACKS ONLY 8 tablet 2   Continuous Blood Gluc Receiver (FREESTYLE LIBRE 2 READER) DEVI Use to monitor blood glucose.     cyanocobalamin 1000 MCG tablet Take by mouth.     diltiazem (DILACOR XR) 180 MG 24 hr capsule Take 180 mg by mouth 2 (two) times daily.     diltiazem (TIAZAC) 180 MG 24 hr capsule Take by mouth.     FLUoxetine (PROZAC) 20 MG capsule TAKE 1 CAPSULE BY MOUTH DAILY WITH BREAKFAST *DISCONTINUE DULOXETINE 60 MG DAILY* 30 capsule 0   furosemide (LASIX) 20 MG tablet Take by mouth.     gabapentin (NEURONTIN) 400 MG capsule Take 1 capsule by mouth at bedtime.     insulin aspart protamine - aspart (NOVOLOG 70/30 MIX) (70-30) 100 UNIT/ML FlexPen Inject 60 Units into the skin daily before breakfast. 62  units before supper     Insulin Pen Needle (B-D UF III MINI PEN NEEDLES) 31G X 5 MM MISC 2 (two) times daily E11.65     JARDIANCE 25 MG TABS tablet Take 25 mg by mouth every morning.     lamoTRIgine (LAMICTAL) 25 MG tablet TAKE 1 TABLET (25 MG TOTAL) BY MOUTH DAILY. 30 tablet 10   metFORMIN (GLUCOPHAGE-XR) 500 MG 24 hr tablet Take 1 tablet by mouth 2 (two) times daily.     Multiple Vitamins-Minerals (ABC PLUS SENIOR ADULTS 50+ PO) Take by mouth.     omeprazole (PRILOSEC) 20 MG capsule Take 20 mg by mouth daily.     Potassium 99 MG TABS Take 1 tablet by mouth daily.     pravastatin (PRAVACHOL) 40 MG tablet Take 40 mg by mouth daily.     QUVIVIQ 25 MG TABS TAKE 1 TABLET BY MOUTH AT BEDTIME FOR SLEEP 15 tablet 0   torsemide (DEMADEX) 20 MG tablet Take by mouth. (Patient not taking: Reported on 12/11/2021)     TRELEGY ELLIPTA 100-62.5-25 MCG/INH AEPB Inhale 1 puff into the lungs daily.     TRULICITY 1.5 MG/0.5ML SOPN SMARTSIG:0.5 Milliliter(s) SUB-Q Once a Week     No current facility-administered medications for this visit.     Musculoskeletal: Strength & Muscle Tone:  UTA Gait & Station:  Seated Patient leans: N/A  Psychiatric Specialty Exam: Review of Systems  Psychiatric/Behavioral:  The patient is nervous/anxious.     There were no vitals taken for this visit.There is no height or weight on file to calculate BMI.  General Appearance: Casual  Eye Contact:  Fair  Speech:  Clear and Coherent  Volume:  Normal  Mood:  Anxious  Affect:  Congruent  Thought Process:  Goal Directed and Descriptions of Associations: Intact  Orientation:  Full (Time, Place, and Person)  Thought Content: Logical   Suicidal Thoughts:  No  Homicidal Thoughts:  No  Memory:  Immediate;   Fair Recent;   Fair Remote;   Fair  Judgement:  Fair  Insight:  Fair  Psychomotor Activity:  Normal  Concentration:  Concentration: Fair  and Attention Span: Fair  Recall:  [employer] of Knowledge: Fair  Language:  Fair  Akathisia:  No  Handed:  Right  AIMS (if indicated): not done  Assets:  Desire for Improvement Housing Social Support  ADL's:  Intact  Cognition: WNL  Sleep:   improving   Screenings: GAD-7    Flowsheet Row Office Visit from 06/21/2022 in [facility] Office Visit from 10/24/2021 in [facility] Counselor from 09/05/2020 in [facility]  Total GAD-7 Score 6 8 20       PHQ2-9    Flowsheet Row Video Visit from 11/27/2022 in [facility] Office Visit from 06/21/2022 in [facility] Office Visit from 10/24/2021 in [facility] Office Visit from 11/01/2020 in [facility] Video Visit from 09/05/2020 in [facility]  PHQ-2 Total Score 0 0 3 6 0  PHQ-9 Total Score -- 3 10 20  --      Flowsheet Row Video Visit from 04/04/2023 in [facility] Office Visit from 02/20/2023 in [facility] Video Visit from 11/27/2022 in [facility]  C-SSRS RISK CATEGORY No Risk No Risk No Risk        Assessment and Plan: Lorraine Hutchinson is a 63 year old Caucasian female, widowed, lives in [place], has a history of MDD, GAD, insomnia, was evaluated by telemedicine today.  Discussed assessment and plan as noted below.   Generalized Anxiety Disorder /Social anxiety disorder-unstable Experiencing significant anxiety symptoms, particularly nervousness and shaking when preparing to go out. Current medication regimen may not fully address these symptoms. She is not interested in therapy at this time.   - Encourage consideration of therapy for anxiety management   - Continue to  cross titrate Cymbalta with Fluoxetine.  Advised to reduce Cymbalta to 30 mg daily for the next 3 weeks and stop. - Continue Fluoxetine 20 mg daily for now.  Will consider increasing the dosage in the future. - Monitor and adjust treatment as necessary    Major Depressive Disorder in remission Plan to taper Duloxetine to 30 mg for three weeks, then discontinue, allowing for an increase in fluoxetine dosage. Also on bupropion (Wellbutrin). Fluoxetine dosage increase is contingent on duloxetine reduction.    - Continue Wellbutrin 75 mg daily - Continue Lamictal 25 mg daily. - Cross titration of Cymbalta and Fluoxetine as noted. - Discussed a referral for therapy-patient declined.  Insomnia-improved Reports sleep has improved since the addition of daridorexant. - Continue Daridorexant 25 mg at bedtime. - Avoid daytime naps. - Continue BiPAP for COPD/obstructive sleep apnea.  Follow-up - Follow-up in clinic in 6 to 8 weeks or sooner if needed.  Collaboration of Care: Collaboration of Care: Patient refused AEB patient declined referral for therapy.  Patient/Guardian was advised Release of Information must be obtained prior to any record release in order to collaborate their care with an outside provider. Patient/Guardian was advised if they have not already done so to contact the registration department to sign all necessary forms in order for [place] to release information regarding their care.   Consent: Patient/Guardian gives verbal consent for treatment and assignment of benefits for services provided during this visit. Patient/Guardian expressed understanding and agreed to proceed.  Discussed the use of a AI scribe software for clinical note transcription  with the patient, who gave verbal consent to proceed.  This note was generated in part or whole with voice recognition software. Voice recognition is usually quite accurate but there are transcription errors that can and very often do occur. I  apologize for any typographical errors that were not detected and corrected.     Jomarie Longs, MD 04/05/2023, 7:20 AM

## 2023-04-16 ENCOUNTER — Other Ambulatory Visit: Payer: Self-pay | Admitting: Psychiatry

## 2023-04-16 DIAGNOSIS — G4701 Insomnia due to medical condition: Secondary | ICD-10-CM

## 2023-04-22 ENCOUNTER — Other Ambulatory Visit: Payer: Self-pay | Admitting: Psychiatry

## 2023-04-22 DIAGNOSIS — F4010 Social phobia, unspecified: Secondary | ICD-10-CM

## 2023-04-22 DIAGNOSIS — F411 Generalized anxiety disorder: Secondary | ICD-10-CM

## 2023-04-26 ENCOUNTER — Telehealth: Payer: Self-pay

## 2023-04-26 NOTE — Telephone Encounter (Signed)
 recieved fax requesting a refill on the lamotrigine. pt was last seen on 3-20 next appt 5-8

## 2023-04-26 NOTE — Telephone Encounter (Signed)
 This prescription was already sent to [facility] and she is not due. Please clarify with patient.

## 2023-04-29 ENCOUNTER — Other Ambulatory Visit: Payer: Self-pay | Admitting: Psychiatry

## 2023-04-29 DIAGNOSIS — G4701 Insomnia due to medical condition: Secondary | ICD-10-CM

## 2023-04-29 NOTE — Telephone Encounter (Signed)
 pt was notified by fax. - confirmed

## 2023-04-30 ENCOUNTER — Telehealth: Payer: Self-pay

## 2023-04-30 NOTE — Telephone Encounter (Signed)
 received fax requesting a refill on the lamotrigine #90. pt was last seen on 3-20 next appt 5-8

## 2023-04-30 NOTE — Telephone Encounter (Signed)
 Per chart review, the medication was sent by Dr. Eappen to the following pharmacy to last until next year. Could you please contact the patient to confirm which pharmacy she currently uses? [facility] - [address] [place] - [address]

## 2023-05-01 NOTE — Telephone Encounter (Signed)
 tried to call pt. but no answer and mailbox was full no message could be left.

## 2023-05-02 NOTE — Telephone Encounter (Signed)
 pt said that she thinks it came in the mailed yesterday but she will check tonight and if not she will call them or call us  back.

## 2023-05-20 ENCOUNTER — Telehealth: Admitting: Psychiatry

## 2023-05-23 ENCOUNTER — Telehealth (INDEPENDENT_AMBULATORY_CARE_PROVIDER_SITE_OTHER): Admitting: Psychiatry

## 2023-05-23 ENCOUNTER — Encounter: Payer: Self-pay | Admitting: Psychiatry

## 2023-05-23 DIAGNOSIS — F3342 Major depressive disorder, recurrent, in full remission: Secondary | ICD-10-CM | POA: Diagnosis not present

## 2023-05-23 DIAGNOSIS — G4701 Insomnia due to medical condition: Secondary | ICD-10-CM

## 2023-05-23 DIAGNOSIS — F4010 Social phobia, unspecified: Secondary | ICD-10-CM | POA: Diagnosis not present

## 2023-05-23 DIAGNOSIS — F411 Generalized anxiety disorder: Secondary | ICD-10-CM | POA: Diagnosis not present

## 2023-05-23 NOTE — Progress Notes (Signed)
 Virtual Visit via Telephone Note  I connected with Lorraine Hutchinson on 05/23/23 at  4:40 PM EDT by telephone and verified that I am speaking with the correct person using two identifiers.  Location Provider Location : [facility] Patient Location : Home  Participants: Patient , Provider   I discussed the limitations, risks, security and privacy concerns of performing an evaluation and management service by telephone and the availability of in person appointments. I also discussed with the patient that there may be a patient responsible charge related to this service. The patient expressed understanding and agreed to proceed.   I discussed the assessment and treatment plan with the patient. The patient was provided an opportunity to ask questions and all were answered. The patient agreed with the plan and demonstrated an understanding of the instructions.   The patient was advised to call back or seek an in-person evaluation if the symptoms worsen or if the condition fails to improve as anticipated.  BH MD OP Progress Note  05/24/2023 7:39 AM [name]  MRN:  [id number]  Chief Complaint:  Chief Complaint  Patient presents with   Follow-up   Depression   Anxiety   Medication Refill   Insomnia   Discussed the use of AI scribe software for clinical note transcription with the patient, who gave verbal consent to proceed.  History of Present Illness Lorraine Hutchinson is a 63 year old Caucasian female, widowed, lives in [place], has a history of MDD, GAD, insomnia, OSA, COPD, chronic pain, chronic kidney disease, morbid obesity was evaluated by phone today.  She was previously on duloxetine  30 mg, which was discontinued, and she was started on fluoxetine  (Prozac ) along with continuing Wellbutrin  and Lamictal . She has been off duloxetine  as discussed and is currently on fluoxetine , which she feels is helping as she is 'not crying like I was'.  She experiences episodes of crying once a  month and continues to have anxiety, particularly when preparing to go out, describing feelings of being rushed and nervousness. She associates these feelings with social anxiety.  She reports sleeping 5 to 6 hours per night and uses a CPAP machine along with an oxygen machine during the day. She does not currently drive due to a dead car battery and broken glasses, and she expresses relief that the appointment is virtual.  Her daughter assists with grocery shopping and household tasks, and her sister and mother visit once or twice a month, providing a support system.  Denies thoughts of self-harm or harm to others.  Visit Diagnosis:    ICD-10-CM   1. MDD (major depressive disorder), recurrent, in full remission (HCC)  F33.42     2. Generalized anxiety disorder  F41.1     3. Insomnia due to medical condition  G47.01    OSA ,COPD on BIPAP,Pain    4. Social anxiety disorder  F40.10       Past Psychiatric History: I have reviewed past psychiatric history from progress note on 04/16/2017.  Past trials of medications like Elavil , Seroquel , Zoloft, Pamelor, Ambien , trazodone , Cymbalta , melatonin, Lunesta , Sonata , mirtazapine.  Past Medical History:  Past Medical History:  Diagnosis Date   Anxiety    Cancer (HCC)    cervical   Depression    Diabetes mellitus without complication (HCC)    Hyperlipidemia    Hypertension    Scoliosis    Sleep apnea     Past Surgical History:  Procedure Laterality Date   ABDOMINAL HYSTERECTOMY  APPENDECTOMY     CESAREAN SECTION     REPLACEMENT TOTAL KNEE Right    TONSILLECTOMY      Family Psychiatric History: I have reviewed family psychiatric history from progress note on 04/16/2017.  Family History:  Family History  Problem Relation Age of Onset   Diabetes Father    Diabetes Mother    Cancer Mother    Anxiety disorder Sister    Depression Sister     Social History: I have reviewed social history from progress note on 04/16/2017. Social  History   Socioeconomic History   Marital status: Widowed    Spouse name: arthur   Number of children: 2   Years of education: Not on file   Highest education level: 11th grade  Occupational History   Not on file  Tobacco Use   Smoking status: Former    Current packs/day: 0.00    Average packs/day: 1 pack/day for 40.0 years (40.0 ttl pk-yrs)    Types: Cigarettes    Start date: 08/15/1977    Quit date: 08/15/2017    Years since quitting: 5.7   Smokeless tobacco: Never  Vaping Use   Vaping status: Never Used  Substance and Sexual Activity   Alcohol use: No    Alcohol/week: 0.0 standard drinks of alcohol   Drug use: Yes    Types: Marijuana   Sexual activity: Not Currently  Other Topics Concern   Not on file  Social History Narrative   Not on file   Social Drivers of Health   Financial Resource Strain: Medium Risk (04/16/2017)   Overall Financial Resource Strain (CARDIA)    Difficulty of Paying Living Expenses: Somewhat hard  Food Insecurity: Food Insecurity Present (04/16/2017)   Hunger Vital Sign    Worried About Running Out of Food in the Last Year: Sometimes true    Ran Out of Food in the Last Year: Sometimes true  Transportation Needs: No Transportation Needs (04/16/2017)   PRAPARE - Administrator, Civil Service (Medical): No    Lack of Transportation (Non-Medical): No  Physical Activity: Inactive (04/16/2017)   Exercise Vital Sign    Days of Exercise per Week: 0 days    Minutes of Exercise per Session: 0 min  Stress: Stress Concern Present (04/16/2017)   [employer] of Occupational Health - Occupational Stress Questionnaire    Feeling of Stress : Very much  Social Connections: Somewhat Isolated (04/16/2017)   Social Connection and Isolation Panel [NHANES]    Frequency of Communication with Friends and Family: More than three times a week    Frequency of Social Gatherings with Friends and Family: Three times a week    Attends Religious Services: Never     Active Member of Clubs or Organizations: No    Attends Banker Meetings: Never    Marital Status: Married    Allergies:  Allergies  Allergen Reactions   Codeine Hives    Metabolic Disorder Labs: No results found for: "HGBA1C", "MPG" No results found for: "PROLACTIN" No results found for: "CHOL", "TRIG", "HDL", "CHOLHDL", "VLDL", "LDLCALC" No results found for: "TSH"  Therapeutic Level Labs: No results found for: "LITHIUM" No results found for: "VALPROATE" No results found for: "CBMZ"  Current Medications: Current Outpatient Medications  Medication Sig Dispense Refill   ACCU-CHEK GUIDE test strip USE (1 STRIP TOTAL) 3 (THREE) TIMES DAILY USE AS INSTRUCTED.     albuterol (VENTOLIN HFA) 108 (90 Base) MCG/ACT inhaler Inhale into the lungs.  Alcohol Swabs (ALCOHOL WIPES) 70 % PADS Clean skin one to two times daily     B Complex Vitamins (VITAMIN B COMPLEX) TABS Take by mouth.     buPROPion  (WELLBUTRIN ) 75 MG tablet TAKE 1 TABLET BY MOUTH DAILY 30 tablet 5   Calcium-Vitamin D  600-200 MG-UNIT tablet Take 1 tablet by mouth 2 (two) times daily.     Cinnamon 500 MG capsule Take 2,000 mg by mouth daily.      clonazePAM  (KLONOPIN ) 0.5 MG tablet Take 1 tablet (0.5 mg total) by mouth as directed. TAKE 1 TABLET BY MOUTH TWICE A WEEK FOR SEVERE ANXIETY ATTACKS ONLY 8 tablet 2   Continuous Blood Gluc Receiver (FREESTYLE LIBRE 2 READER) DEVI Use to monitor blood glucose.     cyanocobalamin 1000 MCG tablet Take by mouth.     Daridorexant  HCl (QUVIVIQ ) 25 MG TABS TAKE 1 TABLET BY MOUTH AT BEDTIME FOR SLEEP 15 tablet 2   diltiazem (DILACOR XR) 180 MG 24 hr capsule Take 180 mg by mouth 2 (two) times daily.     diltiazem (TIAZAC) 180 MG 24 hr capsule Take by mouth.     FLUoxetine  (PROZAC ) 20 MG capsule TAKE 1 CAPSULE BY MOUTH DAILY WITH BREAKFAST *DISCONTINUE DULOXETINE  60MG * 30 capsule 1   furosemide (LASIX) 20 MG tablet Take by mouth.     gabapentin (NEURONTIN) 400 MG capsule Take  1 capsule by mouth at bedtime.     insulin aspart protamine - aspart (NOVOLOG 70/30 MIX) (70-30) 100 UNIT/ML FlexPen Inject 60 Units into the skin daily before breakfast. 62 units before supper     Insulin Pen Needle (B-D UF III MINI PEN NEEDLES) 31G X 5 MM MISC 2 (two) times daily E11.65     JARDIANCE 25 MG TABS tablet Take 25 mg by mouth every morning.     lamoTRIgine  (LAMICTAL ) 25 MG tablet TAKE 1 TABLET (25 MG TOTAL) BY MOUTH DAILY. 30 tablet 10   metFORMIN (GLUCOPHAGE-XR) 500 MG 24 hr tablet Take 1 tablet by mouth 2 (two) times daily.     Multiple Vitamins-Minerals (ABC PLUS SENIOR ADULTS 50+ PO) Take by mouth.     omeprazole (PRILOSEC) 20 MG capsule Take 20 mg by mouth daily.     Potassium 99 MG TABS Take 1 tablet by mouth daily.     pravastatin (PRAVACHOL) 40 MG tablet Take 40 mg by mouth daily.     torsemide (DEMADEX) 20 MG tablet Take by mouth. (Patient not taking: Reported on 12/11/2021)     TRELEGY ELLIPTA 100-62.5-25 MCG/INH AEPB Inhale 1 puff into the lungs daily.     TRULICITY 1.5 MG/0.5ML SOPN SMARTSIG:0.5 Milliliter(s) SUB-Q Once a Week     No current facility-administered medications for this visit.     Musculoskeletal: Strength & Muscle Tone: UTA Gait & Station: Seated Patient leans: N/A  Psychiatric Specialty Exam: Review of Systems  Psychiatric/Behavioral:  The patient is nervous/anxious.     There were no vitals taken for this visit.There is no height or weight on file to calculate BMI.  General Appearance: UTA  Eye Contact:  UTA  Speech:  Clear and Coherent  Volume:  Normal  Mood:  Anxious  Affect:  UTA  Thought Process:  Goal Directed and Descriptions of Associations: Intact  Orientation:  Full (Time, Place, and Person)  Thought Content: Logical   Suicidal Thoughts:  No  Homicidal Thoughts:  No  Memory:  Immediate;   Fair Recent;   Fair Remote;   Fair  Judgement:  Fair  Insight:  Fair  Psychomotor Activity:  UTA  Concentration:  Concentration: Fair  and Attention Span: Fair  Recall:  [employer] of Knowledge: Fair  Language: Fair  Akathisia:  No  Handed:  Right  AIMS (if indicated): not done  Assets:  Desire for Improvement Housing Social Support Transportation  ADL's:  Intact  Cognition: WNL  Sleep:  Fair   Screenings: GAD-7    Garment/textile technologist Visit from 06/21/2022 in [facility] Office Visit from 10/24/2021 in [facility] Counselor from 09/05/2020 in [facility]  Total GAD-7 Score 6 8 20       PHQ2-9    Flowsheet Row Video Visit from 11/27/2022 in [facility] Office Visit from 06/21/2022 in [facility] Office Visit from 10/24/2021 in [facility] Office Visit from 11/01/2020 in [facility] Video Visit from 09/05/2020 in [facility]  PHQ-2 Total Score 0 0 3 6 0  PHQ-9 Total Score -- 3 10 20  --      Flowsheet Row Video Visit from 05/23/2023 in [facility] Video Visit from 04/04/2023 in [facility] Office Visit from 02/20/2023 in [facility]  C-SSRS RISK CATEGORY No Risk No Risk No Risk        Assessment and Plan: CHICQUITA CASHION is a 63 year old Caucasian female, widowed, lives in [place] currently with MDD, GAD, insomnia, discussed assessment and plan as noted below.  Generalized anxiety disorder/Social anxiety disorder Currently reports fluoxetine  is helpful with anxiety although continues to have social anxiety especially when preparing to go out.  Not interested in psychotherapy sessions and would like to continue the current dosage of fluoxetine . - Continue Fluoxetine  20 mg  daily  MDD in remission Currently denies any significant depression symptoms.  Currently tolerating fluoxetine  and was able to come off of duloxetine  as discussed previously. - Continue Wellbutrin  75 mg daily - Continue Lamictal  25 mg daily - Continue Fluoxetine  20 mg daily  Insomnia-stable Currently not compliant on daridorexant  however does have it available as needed.  Reports sleep was improving and is currently working on sleep hygiene. - Continue Daridorexant  25 mg at bedtime, could use it as needed - Continue sleep hygiene techniques including avoiding daytime naps. - Continue BiPAP for COPD/obstructive sleep apnea - Also on oxygen during the day.  Follow-up Follow-up in clinic in 2 to 3 months or sooner if needed.     Consent: Patient/Guardian gives verbal consent for treatment and assignment of benefits for services provided during this visit. Patient/Guardian expressed understanding and agreed to proceed.    I have spent at least 12 minutes non face to face with patient today.    This note was generated in part or whole with voice recognition software. Voice recognition is usually quite accurate but there are transcription errors that can and very often do occur. I apologize for any typographical errors that were not detected and corrected.    Maryclaire Stoecker, MD 05/24/2023, 7:39 AM

## 2023-05-28 ENCOUNTER — Other Ambulatory Visit: Payer: Self-pay | Admitting: Psychiatry

## 2023-05-28 DIAGNOSIS — F411 Generalized anxiety disorder: Secondary | ICD-10-CM

## 2023-06-14 ENCOUNTER — Other Ambulatory Visit: Payer: Self-pay | Admitting: Psychiatry

## 2023-06-14 DIAGNOSIS — F4010 Social phobia, unspecified: Secondary | ICD-10-CM

## 2023-06-14 DIAGNOSIS — F411 Generalized anxiety disorder: Secondary | ICD-10-CM

## 2023-07-15 ENCOUNTER — Other Ambulatory Visit: Payer: Self-pay | Admitting: Psychiatry

## 2023-07-15 DIAGNOSIS — G4701 Insomnia due to medical condition: Secondary | ICD-10-CM

## 2023-07-23 ENCOUNTER — Other Ambulatory Visit: Payer: Self-pay | Admitting: Psychiatry

## 2023-07-23 DIAGNOSIS — G4701 Insomnia due to medical condition: Secondary | ICD-10-CM

## 2023-07-25 ENCOUNTER — Encounter: Payer: Self-pay | Admitting: Psychiatry

## 2023-07-25 ENCOUNTER — Telehealth: Admitting: Psychiatry

## 2023-07-25 DIAGNOSIS — G4701 Insomnia due to medical condition: Secondary | ICD-10-CM | POA: Diagnosis not present

## 2023-07-25 DIAGNOSIS — F3342 Major depressive disorder, recurrent, in full remission: Secondary | ICD-10-CM

## 2023-07-25 DIAGNOSIS — F4010 Social phobia, unspecified: Secondary | ICD-10-CM

## 2023-07-25 DIAGNOSIS — F411 Generalized anxiety disorder: Secondary | ICD-10-CM | POA: Diagnosis not present

## 2023-07-25 NOTE — Progress Notes (Signed)
 Virtual Visit via Telephone Note  I connected with Lorraine Hutchinson on 07/25/23 at  4:40 PM EDT by telephone and verified that I am speaking with the correct person using two identifiers.  Location Provider Location : [facility] Patient Location : Home  Participants: Patient , Provider   I discussed the limitations, risks, security and privacy concerns of performing an evaluation and management service by telephone and the availability of in person appointments. I also discussed with the patient that there may be a patient responsible charge related to this service. The patient expressed understanding and agreed to proceed.   I discussed the assessment and treatment plan with the patient. The patient was provided an opportunity to ask questions and all were answered. The patient agreed with the plan and demonstrated an understanding of the instructions.   The patient was advised to call back or seek an in-person evaluation if the symptoms worsen or if the condition fails to improve as anticipated.   BH MD OP Progress Note  07/26/2023 8:28 AM Lorraine Hutchinson  MRN:  [id number]  Chief Complaint:  Chief Complaint  Patient presents with   Follow-up   Anxiety   Depression   Medication Refill   Discussed the use of AI scribe software for clinical note transcription with the patient, who gave verbal consent to proceed.  History of Present Illness Lorraine Hutchinson is a 63 year old Caucasian female, widowed, lives in [place], has a history of MDD, GAD, insomnia, COPD, chronic pain, chronic kidney disease, morbid obesity was evaluated by phone today.  Her mood and sleep have been stable, with approximately five to six hours of sleep per night. She manages her major depression, generalized anxiety, and insomnia with fluoxetine  20 mg, Wellbutrin  75 mg, and Lamictal  25 mg. For sleep, she takes daridorexant  25 mg and uses oxygen as needed during the day.  She experiences anxiety related to going  out and meeting people, as well as anxiety about falling due to a garden tub with a wide side. She feels rushed when she has to go somewhere but is fine when she does not have to leave the house. She takes fluoxetine  20 mg daily for anxiety and uses clonazepam  as needed, particularly before showering to manage anxiety.  Her appetite is stable, and she is not on any medication for weight loss. She mentions a weight loss of approximately 20 pounds, which was noted by her primary doctor.   Denies thoughts of self-harm or harm to others.  She appeared to be alert, oriented to person place time situation.  Denies any other concerns today.    Visit Diagnosis:    ICD-10-CM   1. MDD (major depressive disorder), recurrent, in full remission (HCC)  F33.42     2. Generalized anxiety disorder  F41.1     3. Insomnia due to medical condition  G47.01    OSA, COPD on BiPAP    4. Social anxiety disorder  F40.10       Past Psychiatric History: I have reviewed past psychiatric history from progress note on 04/16/2017.  Past trials of medications like Elavil , Seroquel , Zoloft, Pamelor, Ambien , trazodone , Cymbalta , melatonin, Lunesta , Sonata , mirtazapine.  Past Medical History:  Past Medical History:  Diagnosis Date   Anxiety    Cancer (HCC)    cervical   Depression    Diabetes mellitus without complication (HCC)    Hyperlipidemia    Hypertension    Scoliosis    Sleep apnea  Past Surgical History:  Procedure Laterality Date   ABDOMINAL HYSTERECTOMY     APPENDECTOMY     CESAREAN SECTION     REPLACEMENT TOTAL KNEE Right    TONSILLECTOMY      Family Psychiatric History: I have reviewed family psychiatric history from progress note on 04/16/2017.  Family History:  Family History  Problem Relation Age of Onset   Diabetes Father    Diabetes Mother    Cancer Mother    Anxiety disorder Sister    Depression Sister     Social History: I have reviewed social history from progress note on  04/16/2017. Social History   Socioeconomic History   Marital status: Widowed    Spouse name: arthur   Number of children: 2   Years of education: Not on file   Highest education level: 11th grade  Occupational History   Not on file  Tobacco Use   Smoking status: Former    Current packs/day: 0.00    Average packs/day: 1 pack/day for 40.0 years (40.0 ttl pk-yrs)    Types: Cigarettes    Start date: 08/15/1977    Quit date: 08/15/2017    Years since quitting: 5.9   Smokeless tobacco: Never  Vaping Use   Vaping status: Never Used  Substance and Sexual Activity   Alcohol use: No    Alcohol/week: 0.0 standard drinks of alcohol   Drug use: Yes    Types: Marijuana   Sexual activity: Not Currently  Other Topics Concern   Not on file  Social History Narrative   Not on file   Social Drivers of Health   Financial Resource Strain: Medium Risk (04/16/2017)   Overall Financial Resource Strain (CARDIA)    Difficulty of Paying Living Expenses: Somewhat hard  Food Insecurity: Food Insecurity Present (04/16/2017)   Hunger Vital Sign    Worried About Running Out of Food in the Last Year: Sometimes true    Ran Out of Food in the Last Year: Sometimes true  Transportation Needs: No Transportation Needs (04/16/2017)   PRAPARE - Administrator, Civil Service (Medical): No    Lack of Transportation (Non-Medical): No  Physical Activity: Inactive (04/16/2017)   Exercise Vital Sign    Days of Exercise per Week: 0 days    Minutes of Exercise per Session: 0 min  Stress: Stress Concern Present (04/16/2017)   [employer] of Occupational Health - Occupational Stress Questionnaire    Feeling of Stress : Very much  Social Connections: Somewhat Isolated (04/16/2017)   Social Connection and Isolation Panel    Frequency of Communication with Friends and Family: More than three times a week    Frequency of Social Gatherings with Friends and Family: Three times a week    Attends Religious Services:  Never    Active Member of Clubs or Organizations: No    Attends Banker Meetings: Never    Marital Status: Married    Allergies:  Allergies  Allergen Reactions   Codeine Hives    Metabolic Disorder Labs: No results found for: HGBA1C, MPG No results found for: PROLACTIN No results found for: CHOL, TRIG, HDL, CHOLHDL, VLDL, LDLCALC No results found for: TSH  Therapeutic Level Labs: No results found for: LITHIUM No results found for: VALPROATE No results found for: CBMZ  Current Medications: Current Outpatient Medications  Medication Sig Dispense Refill   cyclobenzaprine  (FLEXERIL ) 10 MG tablet Take 10 mg by mouth.     ACCU-CHEK GUIDE test strip USE (1  STRIP TOTAL) 3 (THREE) TIMES DAILY USE AS INSTRUCTED.     ADVAIR DISKUS 250-50 MCG/ACT AEPB Inhale 1 puff into the lungs 2 (two) times daily.     albuterol (VENTOLIN HFA) 108 (90 Base) MCG/ACT inhaler Inhale into the lungs.     Alcohol Swabs (ALCOHOL WIPES) 70 % PADS Clean skin one to two times daily     B Complex Vitamins (VITAMIN B COMPLEX) TABS Take by mouth.     buPROPion  (WELLBUTRIN ) 75 MG tablet TAKE 1 TABLET BY MOUTH DAILY 30 tablet 5   Calcium-Vitamin D  600-200 MG-UNIT tablet Take 1 tablet by mouth 2 (two) times daily.     Cinnamon 500 MG capsule Take 2,000 mg by mouth daily.      clonazePAM  (KLONOPIN ) 0.5 MG tablet Take 1 tablet (0.5 mg total) by mouth as directed. TAKE 1 TABLET BY MOUTH TWICE A WEEK FOR SEVERE ANXIETY ATTACKS ONLY 8 tablet 2   Continuous Blood Gluc Receiver (FREESTYLE LIBRE 2 READER) DEVI Use to monitor blood glucose.     Continuous Glucose Sensor (DEXCOM G7 SENSOR) MISC SMARTSIG:1 Each Every 10 Days     cyanocobalamin 1000 MCG tablet Take by mouth.     diltiazem (DILACOR XR) 180 MG 24 hr capsule Take 180 mg by mouth 2 (two) times daily.     diltiazem (TIAZAC) 180 MG 24 hr capsule Take by mouth.     FLUoxetine  (PROZAC ) 20 MG capsule TAKE ONE (1) CAPSULE BY MOUTH  DAILY WITH BREAKFAST *DISCONTINUE DULOXETINE  60MG * 30 capsule 1   furosemide (LASIX) 20 MG tablet Take by mouth.     gabapentin (NEURONTIN) 400 MG capsule Take 1 capsule by mouth at bedtime.     insulin aspart protamine - aspart (NOVOLOG 70/30 MIX) (70-30) 100 UNIT/ML FlexPen Inject 60 Units into the skin daily before breakfast. 62 units before supper     Insulin Pen Needle (B-D UF III MINI PEN NEEDLES) 31G X 5 MM MISC 2 (two) times daily E11.65     JARDIANCE 25 MG TABS tablet Take 25 mg by mouth every morning.     lamoTRIgine  (LAMICTAL ) 25 MG tablet TAKE 1 TABLET (25 MG TOTAL) BY MOUTH DAILY. 30 tablet 10   metFORMIN (GLUCOPHAGE-XR) 500 MG 24 hr tablet Take 1 tablet by mouth 2 (two) times daily.     Multiple Vitamins-Minerals (ABC PLUS SENIOR ADULTS 50+ PO) Take by mouth.     omeprazole (PRILOSEC) 20 MG capsule Take 20 mg by mouth daily.     Potassium 99 MG TABS Take 1 tablet by mouth daily.     pravastatin (PRAVACHOL) 40 MG tablet Take 40 mg by mouth daily.     QUVIVIQ  25 MG TABS TAKE 1 TABLET BY MOUTH AT BEDTIME FOR SLEEP 15 tablet 3   torsemide (DEMADEX) 20 MG tablet Take by mouth. (Patient not taking: Reported on 12/11/2021)     TRELEGY ELLIPTA 100-62.5-25 MCG/INH AEPB Inhale 1 puff into the lungs daily.     TRULICITY 1.5 MG/0.5ML SOPN SMARTSIG:0.5 Milliliter(s) SUB-Q Once a Week     No current facility-administered medications for this visit.     Musculoskeletal: Strength & Muscle Tone: UTA Gait & Station: uUTA Patient leans: N/A  Psychiatric Specialty Exam: Review of Systems  Psychiatric/Behavioral:  The patient is nervous/anxious.     There were no vitals taken for this visit.There is no height or weight on file to calculate BMI.  General Appearance: UTA  Eye Contact:  UTA  Speech:  Clear and Coherent  Volume:  Normal  Mood:  Anxious  Affect:  UTA  Thought Process:  Goal Directed and Descriptions of Associations: Intact  Orientation:  Full (Time, Place, and Person)   Thought Content: Logical   Suicidal Thoughts:  No  Homicidal Thoughts:  No  Memory:  Immediate;   Fair Recent;   Fair Remote;   Fair  Judgement:  Fair  Insight:  Fair  Psychomotor Activity:  UTA  Concentration:  Concentration: Fair and Attention Span: Fair  Recall:  [employer] of Knowledge: Fair  Language: Fair  Akathisia:  No  Handed:  Right  AIMS (if indicated): not done  Assets:  Communication Skills Desire for Improvement Housing Social Support  ADL's:  Intact  Cognition: WNL  Sleep:  Fair   Screenings: GAD-7    Garment/textile technologist Visit from 06/21/2022 in [facility] Office Visit from 10/24/2021 in [facility] Counselor from 09/05/2020 in [facility]  Total GAD-7 Score 6 8 20    PHQ2-9    Flowsheet Row Video Visit from 11/27/2022 in [facility] Office Visit from 06/21/2022 in [facility] Office Visit from 10/24/2021 in [facility] Office Visit from 11/01/2020 in [facility] Video Visit from 09/05/2020 in [facility]  PHQ-2 Total Score 0 0 3 6 0  PHQ-9 Total Score -- 3 10 20  --   Flowsheet Row Video Visit from 07/25/2023 in [facility] Video Visit from 05/23/2023 in [facility] Video Visit from 04/04/2023 in [facility]  C-SSRS RISK CATEGORY No Risk No Risk No Risk     Assessment and Plan: Lorraine Hutchinson is a 63 year old Caucasian female, widowed, lives in [place], has a history of MDD, GAD, insomnia was evaluated by phone today.  Discussed assessment and plan as noted below.  Generalized anxiety disorder/Social anxiety  disorder-improving Currently continues to have ongoing anxietysituational, social although manageable.  Good benefit from fluoxetine . Continue Fluoxetine  20 mg daily Continue Clonazepam  0.5 mg once a day as needed for severe anxiety attacks only. Reviewed [place] PMP AWARxE  Insomnia-stable Currently reports sleep is overall good on the current medication regimen. Continue Daridorexant  25 mg at bedtime Continue BiPAP for COPD/obstructive sleep apnea. Continue oxygen during the day.  MDD in remission Currently denies any significant depression symptoms. Continue Wellbutrin  75 mg daily Continue Lamictal  25 mg daily Continue Fluoxetine  20 mg daily  Follow-up Follow-up in clinic in 3 months or sooner if needed.   I have spent at least 14 minutes non face to face with patient today.   Collaboration of Care: Collaboration of Care: Patient refused AEB patient declines referral for CBT  Patient/Guardian was advised Release of Information must be obtained prior to any record release in order to collaborate their care with an outside provider. Patient/Guardian was advised if they have not already done so to contact the registration department to sign all necessary forms in order for us  to release information regarding their care.   Consent: Patient/Guardian gives verbal consent for treatment and assignment of benefits for services provided during this visit. Patient/Guardian expressed understanding and agreed to proceed.   This note was generated in part or whole with voice recognition software. Voice recognition is usually quite accurate but there are transcription errors that can and very  often do occur. I apologize for any typographical errors that were not detected and corrected.    Jarid Sasso, MD 07/26/2023, 8:28 AM

## 2023-08-05 ENCOUNTER — Telehealth: Payer: Self-pay

## 2023-08-05 DIAGNOSIS — F3342 Major depressive disorder, recurrent, in full remission: Secondary | ICD-10-CM

## 2023-08-05 DIAGNOSIS — Z79899 Other long term (current) drug therapy: Secondary | ICD-10-CM

## 2023-08-05 NOTE — Telephone Encounter (Signed)
 pt left a message that she needs a refill on the clonazepam . pt was last seen on 7-10 next appt 10-23

## 2023-08-05 NOTE — Telephone Encounter (Signed)
 Will need a urine drug screen prior to refilling klonopin . Please check with her what lab she wants it ordered to? Is [facility] ok ?

## 2023-08-05 NOTE — Telephone Encounter (Signed)
 left message that per dr. coby a UDS would need to be done and for her to contact our office to where she wanted labwork done at. [facility] or [facility].

## 2023-08-06 DIAGNOSIS — Z79899 Other long term (current) drug therapy: Secondary | ICD-10-CM | POA: Insufficient documentation

## 2023-08-06 NOTE — Telephone Encounter (Signed)
 I have printed out urine drug screen order for this patient.  Will have nurse fax it to [facility] of choice or patient to come pick it up.

## 2023-08-06 NOTE — Telephone Encounter (Signed)
 pt was emailed a copy just in case she needs it.

## 2023-08-06 NOTE — Telephone Encounter (Signed)
 left message that labwork has been ordered and that a copy of the order had also been emailed to her incase she needed it.

## 2023-08-06 NOTE — Telephone Encounter (Signed)
 pt called states she was returning call. pt was told that labwork order has been submitted and that copy was also emailed to her also. and that she should be able to go to any [facility] and have done.

## 2023-08-14 ENCOUNTER — Other Ambulatory Visit: Payer: Self-pay | Admitting: Psychiatry

## 2023-08-14 DIAGNOSIS — F4010 Social phobia, unspecified: Secondary | ICD-10-CM

## 2023-08-14 DIAGNOSIS — F411 Generalized anxiety disorder: Secondary | ICD-10-CM

## 2023-08-15 ENCOUNTER — Other Ambulatory Visit
Admission: RE | Admit: 2023-08-15 | Discharge: 2023-08-15 | Disposition: A | Discharge status: Home / Self Care | Attending: Psychiatry | Admitting: Psychiatry

## 2023-08-15 DIAGNOSIS — F3342 Major depressive disorder, recurrent, in full remission: Secondary | ICD-10-CM | POA: Diagnosis present

## 2023-08-15 DIAGNOSIS — Z79899 Other long term (current) drug therapy: Secondary | ICD-10-CM | POA: Insufficient documentation

## 2023-08-17 LAB — DRUG SCREEN, UR (12+OXYCODONE+CRT)
Amphetamine Scrn, Ur: NEGATIVE ng/mL
BARBITURATE SCREEN URINE: NEGATIVE ng/mL
Benzodiazepines Screen, Urine: NEGATIVE ng/mL
CANNABINOIDS UR QL SCN: NEGATIVE ng/mL
Cocaine (Metab) Scrn, Ur: NEGATIVE ng/mL
Creatinine(Crt), U: 150.2 mg/dL (ref 20.0–300.0)
Fentanyl, Urine: NEGATIVE pg/mL
Meperidine Screen, Urine: NEGATIVE ng/mL
Methadone Screen, Urine: NEGATIVE ng/mL
Opiate Scrn, Ur: NEGATIVE ng/mL
Oxycodone/Oxymorphone Urine: NEGATIVE ng/mL
Ph of Urine: 5.4 (ref 4.5–8.9)
Phencyclidine Screen, Urine: NEGATIVE ng/mL
Propoxyphene Scrn, Ur: NEGATIVE ng/mL
SPECIFIC GRAVITY: 1.008
Tramadol Screen, Urine: NEGATIVE ng/mL

## 2023-08-18 ENCOUNTER — Ambulatory Visit: Payer: Self-pay | Admitting: Psychiatry

## 2023-08-18 ENCOUNTER — Other Ambulatory Visit: Payer: Self-pay | Admitting: Psychiatry

## 2023-08-18 DIAGNOSIS — F411 Generalized anxiety disorder: Secondary | ICD-10-CM

## 2023-08-18 MED ORDER — CLONAZEPAM 0.5 MG PO TABS: 0.5000 mg | ORAL_TABLET | ORAL | 0 refills | Status: DC

## 2023-08-18 NOTE — Telephone Encounter (Signed)
 This is Dr. Senora patient. Please inform her that the urine test was negative. I've ordered a clonazepam  refill to last for one month. Further refills will be deferred to Dr. Eappen.

## 2023-08-19 NOTE — Progress Notes (Signed)
 Called patient to inform of results of UDS and to also inform that the clonazepam  was ordered no answer voicemail box full unable to leave a message

## 2023-08-19 NOTE — Progress Notes (Signed)
 Patient returned call to office informed patient of the UDS results and that her Clonazepam  was ordered patient voiced understanding

## 2023-09-11 ENCOUNTER — Telehealth: Payer: Self-pay

## 2023-09-11 DIAGNOSIS — F411 Generalized anxiety disorder: Secondary | ICD-10-CM

## 2023-09-11 MED ORDER — CLONAZEPAM 0.5 MG PO TABS: 0.5000 mg | ORAL_TABLET | ORAL | 0 refills | Status: DC

## 2023-09-11 NOTE — Telephone Encounter (Signed)
 pt states she done her labwork and she needs refills on the clonazepam . pt was last seen on 7-10 next appt 10-23

## 2023-09-11 NOTE — Telephone Encounter (Signed)
 I have sent clonazepam  with date specified to pharmacy.

## 2023-09-11 NOTE — Telephone Encounter (Signed)
 Pt was notified.

## 2023-09-16 ENCOUNTER — Other Ambulatory Visit: Payer: Self-pay | Admitting: Psychiatry

## 2023-09-16 DIAGNOSIS — F411 Generalized anxiety disorder: Secondary | ICD-10-CM

## 2023-11-07 ENCOUNTER — Telehealth: Admitting: Psychiatry

## 2023-11-29 ENCOUNTER — Other Ambulatory Visit: Payer: Self-pay | Admitting: Psychiatry

## 2023-11-29 DIAGNOSIS — G4701 Insomnia due to medical condition: Secondary | ICD-10-CM

## 2023-11-29 DIAGNOSIS — F3342 Major depressive disorder, recurrent, in full remission: Secondary | ICD-10-CM

## 2023-12-16 ENCOUNTER — Other Ambulatory Visit: Payer: Self-pay | Admitting: Psychiatry

## 2023-12-16 DIAGNOSIS — F411 Generalized anxiety disorder: Secondary | ICD-10-CM

## 2023-12-19 ENCOUNTER — Ambulatory Visit: Admitting: Psychiatry

## 2023-12-19 ENCOUNTER — Other Ambulatory Visit: Payer: Self-pay

## 2023-12-19 ENCOUNTER — Encounter: Payer: Self-pay | Admitting: Psychiatry

## 2023-12-19 VITALS — BP 147/74 | HR 121 | Temp 97.1°F | Ht 69.0 in | Wt 388.6 lb

## 2023-12-19 DIAGNOSIS — F3342 Major depressive disorder, recurrent, in full remission: Secondary | ICD-10-CM | POA: Diagnosis not present

## 2023-12-19 DIAGNOSIS — F411 Generalized anxiety disorder: Secondary | ICD-10-CM | POA: Diagnosis not present

## 2023-12-19 DIAGNOSIS — F4010 Social phobia, unspecified: Secondary | ICD-10-CM | POA: Diagnosis not present

## 2023-12-19 DIAGNOSIS — G4701 Insomnia due to medical condition: Secondary | ICD-10-CM | POA: Diagnosis not present

## 2023-12-19 MED ORDER — FLUOXETINE HCL 40 MG PO CAPS: 40.0000 mg | ORAL_CAPSULE | Freq: Every day | ORAL | 0 refills | Status: AC

## 2023-12-19 NOTE — Progress Notes (Signed)
 [facility] MD OP Progress Note  12/19/2023 4:36 PM Lorraine Hutchinson  MRN:  [id number]  Chief Complaint:  Chief Complaint  Patient presents with   Follow-up   Depression   Anxiety   Insomnia   Medication Refill   Discussed the use of AI scribe software for clinical note transcription with the patient, who gave verbal consent to proceed.  History of Present Illness Lorraine Hutchinson is a 63 year old Caucasian female, widowed, lives in [place], has a history of MDD, GAD, insomnia, COPD, chronic pain, chronic kidney disease, morbid obesity was evaluated in office today for a follow-up appointment.  She reports increasing irritability and feeling snappy, particularly toward her daughter and sister, and she describes experiencing more anxiety attacks than usual. She reports that these changes began after she recently switched from duloxetine  (previously at 80-90 mg) to fluoxetine , which she currently takes at 20 mg daily. She reports that anxiety is more prominent than depression and denies current depressive symptoms.  Her current regimen includes clonazepam  as needed for severe anxiety attacks, Wellbutrin  75 mg daily, and Lamictal  25 mg daily. She confirms that she adheres to her prescribed medication regimen. She states that she is not taking any sleep medication due to concerns about sleep apnea but reports sleeping adequately with the use of BiPAP.  She denies suicidal ideation and thoughts of harming herself or others during the encounter.  She attended Thanksgiving at her sister's house with family, including children. Her sister brought her to the visit. She interacts regularly with her daughter and sister.    Visit Diagnosis:    ICD-10-CM   1. MDD (major depressive disorder), recurrent, in full remission  F33.42 FLUoxetine  (PROZAC ) 40 MG capsule    2. Generalized anxiety disorder  F41.1 FLUoxetine  (PROZAC ) 40 MG capsule    3. Insomnia due to medical condition  G47.01    OSA, COPD on  BiPAP    4. Social anxiety disorder  F40.10 FLUoxetine  (PROZAC ) 40 MG capsule      Past Psychiatric History: I have reviewed past psychiatric history from progress note on 04/16/2017.  Past trials of medications like Elavil , Seroquel , Zoloft, Pamelor, Ambien , trazodone , Cymbalta , melatonin, Lunesta , Sonata , mirtazapine daridorexant  .  Past Medical History:  Past Medical History:  Diagnosis Date   Anxiety    Cancer (HCC)    cervical   Depression    Diabetes mellitus without complication (HCC)    Hyperlipidemia    Hypertension    Scoliosis    Sleep apnea     Past Surgical History:  Procedure Laterality Date   ABDOMINAL HYSTERECTOMY     APPENDECTOMY     CESAREAN SECTION     REPLACEMENT TOTAL KNEE Right    TONSILLECTOMY      Family Psychiatric History: Reviewed family psychiatric history from progress note on 04/16/2017.  Family History:  Family History  Problem Relation Age of Onset   Diabetes Father    Diabetes Mother    Cancer Mother    Anxiety disorder Sister    Depression Sister     Social History: Reviewed social history from progress note on 04/16/2017.   Social History   Socioeconomic History   Marital status: Widowed    Spouse name: arthur   Number of children: 2   Years of education: Not on file   Highest education level: 11th grade  Occupational History   Not on file  Tobacco Use   Smoking status: Former    Current packs/day: 0.00  Average packs/day: 1 pack/day for 40.0 years (40.0 ttl pk-yrs)    Types: Cigarettes    Start date: 08/15/1977    Quit date: 08/15/2017    Years since quitting: 6.3   Smokeless tobacco: Never  Vaping Use   Vaping status: Never Used  Substance and Sexual Activity   Alcohol use: No    Alcohol/week: 0.0 standard drinks of alcohol   Drug use: Yes    Types: Marijuana   Sexual activity: Not Currently  Other Topics Concern   Not on file  Social History Narrative   Not on file   Social Drivers of Health   Financial  Resource Strain: Low Risk  (11/06/2023)   Received from [facility]   Overall Financial Resource Strain (CARDIA)    Difficulty of Paying Living Expenses: Not very hard  Food Insecurity: Food Insecurity Present (11/06/2023)   Received from [facility]   Hunger Vital Sign    Within the past 12 months, you worried that your food would run out before you got the money to buy more.: Sometimes true    Within the past 12 months, the food you bought just didn't last and you didn't have money to get more.: Sometimes true  Transportation Needs: No Transportation Needs (11/06/2023)   Received from [facility] - Transportation    In the past 12 months, has lack of transportation kept you from medical appointments or from getting medications?: No    Lack of Transportation (Non-Medical): No  Physical Activity: Inactive (04/16/2017)   Exercise Vital Sign    Days of Exercise per Week: 0 days    Minutes of Exercise per Session: 0 min  Stress: Stress Concern Present (04/16/2017)   [employer] - Occupational Stress Questionnaire    Feeling of Stress : Very much  Social Connections: Somewhat Isolated (04/16/2017)   Social Connection and Isolation Panel    Frequency of Communication with Friends and Family: More than three times a week    Frequency of Social Gatherings with Friends and Family: Three times a week    Attends Religious Services: Never    Active Member of Clubs or Organizations: No    Attends Banker Meetings: Never    Marital Status: Married    Allergies:  Allergies  Allergen Reactions   Codeine Hives    Metabolic Disorder Labs: No results found for: HGBA1C, MPG No results found for: PROLACTIN No results found for: CHOL, TRIG, HDL, CHOLHDL, VLDL, LDLCALC No results found for: TSH  Therapeutic Level Labs: No results found for: LITHIUM No results found for:  VALPROATE No results found for: CBMZ  Current Medications: Current Outpatient Medications  Medication Sig Dispense Refill   FLUoxetine  (PROZAC ) 40 MG capsule Take 1 capsule (40 mg total) by mouth daily. Dose increase , stop Fluoxetine  20 mg 90 capsule 0   ACCU-CHEK GUIDE test strip USE (1 STRIP TOTAL) 3 (THREE) TIMES DAILY USE AS INSTRUCTED.     ADVAIR DISKUS 250-50 MCG/ACT AEPB Inhale 1 puff into the lungs 2 (two) times daily.     albuterol (VENTOLIN HFA) 108 (90 Base) MCG/ACT inhaler Inhale into the lungs.     Alcohol Swabs (ALCOHOL WIPES) 70 % PADS Clean skin one to two times daily     B Complex Vitamins (VITAMIN B COMPLEX) TABS Take by mouth.     buPROPion  (WELLBUTRIN ) 75 MG tablet TAKE 1 TABLET BY MOUTH DAILY 30 tablet 5  Calcium-Vitamin D  600-200 MG-UNIT tablet Take 1 tablet by mouth 2 (two) times daily.     Cinnamon 500 MG capsule Take 2,000 mg by mouth daily.      clonazePAM  (KLONOPIN ) 0.5 MG tablet TAKE 1 TABLET BY MOUTH TWICE WEEKLY AS DIRECTED FOR SEVERE ANXIETY ATTACKS ONLY 8 tablet 2   Continuous Blood Gluc Receiver (FREESTYLE LIBRE 2 READER) DEVI Use to monitor blood glucose.     Continuous Glucose Sensor (DEXCOM G7 SENSOR) MISC SMARTSIG:1 Each Every 10 Days     cyanocobalamin 1000 MCG tablet Take by mouth.     cyclobenzaprine  (FLEXERIL ) 10 MG tablet Take 10 mg by mouth.     diltiazem (DILACOR XR) 180 MG 24 hr capsule Take 180 mg by mouth 2 (two) times daily.     diltiazem (TIAZAC) 180 MG 24 hr capsule Take by mouth.     furosemide (LASIX) 20 MG tablet Take by mouth.     gabapentin (NEURONTIN) 400 MG capsule Take 1 capsule by mouth at bedtime.     insulin aspart protamine - aspart (NOVOLOG 70/30 MIX) (70-30) 100 UNIT/ML FlexPen Inject 60 Units into the skin daily before breakfast. 62 units before supper     Insulin Pen Needle (B-D UF III MINI PEN NEEDLES) 31G X 5 MM MISC 2 (two) times daily E11.65     JARDIANCE 25 MG TABS tablet Take 25 mg by mouth every morning.      lamoTRIgine  (LAMICTAL ) 25 MG tablet TAKE 1 TABLET (25 MG TOTAL) BY MOUTH DAILY. 30 tablet 10   metFORMIN (GLUCOPHAGE-XR) 500 MG 24 hr tablet Take 1 tablet by mouth 2 (two) times daily.     Multiple Vitamins-Minerals (ABC PLUS SENIOR ADULTS 50+ PO) Take by mouth.     omeprazole (PRILOSEC) 20 MG capsule Take 20 mg by mouth daily.     Potassium 99 MG TABS Take 1 tablet by mouth daily.     pravastatin (PRAVACHOL) 40 MG tablet Take 40 mg by mouth daily.     torsemide (DEMADEX) 20 MG tablet Take by mouth. (Patient not taking: Reported on 12/11/2021)     TRELEGY ELLIPTA 100-62.5-25 MCG/INH AEPB Inhale 1 puff into the lungs daily.     TRULICITY 1.5 MG/0.5ML SOPN SMARTSIG:0.5 Milliliter(s) SUB-Q Once a Week     No current facility-administered medications for this visit.     Musculoskeletal: Strength & Muscle Tone: within normal limits Gait & Station: walks with walker Patient leans: N/A  Psychiatric Specialty Exam: Review of Systems  Psychiatric/Behavioral:  The patient is nervous/anxious.        Irritable    Blood pressure (!) 147/74, pulse (!) 121, temperature (!) 97.1 F (36.2 C), temperature source Temporal, height 5' 9 (1.753 m), weight (!) 388 lb 9.6 oz (176.3 kg).Body mass index is 57.39 kg/m.  General Appearance: Casual  Eye Contact:  Fair  Speech:  Clear and Coherent  Volume:  Normal  Mood:  Anxious irritable  Affect:  Congruent  Thought Process:  Goal Directed and Descriptions of Associations: Intact  Orientation:  Full (Time, Place, and Person)  Thought Content: Logical   Suicidal Thoughts:  No  Homicidal Thoughts:  No  Memory:  Immediate;   Fair Recent;   Fair Remote;   Fair  Judgement:  Fair  Insight:  Fair  Psychomotor Activity:  Normal  Concentration:  Concentration: Fair and Attention Span: Fair  Recall:  [employer] of Knowledge: Fair  Language: Fair  Akathisia:  No  Handed:  Right  AIMS (if indicated): not done  Assets:  Communication Skills Desire for  Improvement Housing Social Support Transportation  ADL's:  Intact  Cognition: WNL  Sleep:  Fair   Screenings: GAD-7    Garment/textile Technologist Visit from 06/21/2022 in [facility] Office Visit from 10/24/2021 in [facility] Counselor from 09/05/2020 in [facility]  Total GAD-7 Score 6 8 20    PHQ2-9    Flowsheet Row Video Visit from 11/27/2022 in [facility] Office Visit from 06/21/2022 in [facility] Office Visit from 10/24/2021 in [facility] Office Visit from 11/01/2020 in [facility] Video Visit from 09/05/2020 in [facility]  PHQ-2 Total Score 0 0 3 6 0  PHQ-9 Total Score -- 3 10 20  --   Flowsheet Row Office Visit from 12/19/2023 in [facility] Video Visit from 07/25/2023 in [facility] Video Visit from 05/23/2023 in [facility]  C-SSRS RISK CATEGORY No Risk No Risk No Risk     Assessment and Plan: LITA FLYNN is a 63 year old Caucasian female, presented for a follow-up appointment, discussed assessment and plan as noted below.  1. MDD (major depressive disorder), recurrent, in full remission Currently denies any depression symptoms Continue Wellbutrin  75 mg daily Continue Lamictal  25 mg daily Continue Fluoxetine  as prescribed  2. Generalized anxiety disorder-unstable Ongoing irritability/anxiety.  Agreeable to dosage increase of fluoxetine  Increase Fluoxetine  to 40 mg daily Continue Clonazepam  0.5 mg as needed for severe anxiety attacks only Reviewed [place] PMP AWARxE  3. Insomnia due to medical condition-stable Currently not compliant on  daridorexant  and reports sleep is overall good. Continue BiPAP for COPD/obstructive sleep apnea Continue oxygen during the day  4. Social anxiety disorder-improving Reports she has been able to interact with others and participate in Thanksgiving get-together. Continue medications as prescribed including fluoxetine   Follow-up Follow-up in clinic in 4 weeks or sooner if needed.     Collaboration of Care: Collaboration of Care: Patient refused AEB declines referral for CBT. Encouraged to monitor blood pressure reading closely and follow-up with primary care provider.  Patient/Guardian was advised Release of Information must be obtained prior to any record release in order to collaborate their care with an outside provider. Patient/Guardian was advised if they have not already done so to contact the registration department to sign all necessary forms in order for us  to release information regarding their care.   Consent: Patient/Guardian gives verbal consent for treatment and assignment of benefits for services provided during this visit. Patient/Guardian expressed understanding and agreed to proceed.   This note was generated in part or whole with voice recognition software. Voice recognition is usually quite accurate but there are transcription errors that can and very often do occur. I apologize for any typographical errors that were not detected and corrected.    Lorraine Brancato, MD 12/20/2023, 8:25 AM

## 2024-01-14 ENCOUNTER — Other Ambulatory Visit: Payer: Self-pay | Admitting: Psychiatry

## 2024-01-14 DIAGNOSIS — F3342 Major depressive disorder, recurrent, in full remission: Secondary | ICD-10-CM

## 2024-01-29 ENCOUNTER — Other Ambulatory Visit (HOSPITAL_COMMUNITY): Payer: Self-pay

## 2024-01-29 ENCOUNTER — Telehealth: Admitting: Psychiatry

## 2024-02-20 ENCOUNTER — Ambulatory Visit: Admitting: Psychiatry

## 2024-04-16 ENCOUNTER — Ambulatory Visit: Admitting: Psychiatry
